# Patient Record
Sex: Female | Born: 1980 | Race: White | Hispanic: No | Marital: Single | Smoking: Current some day smoker
Health system: Southern US, Community
[De-identification: ages and names within clinical notes are randomized; demographics above are authoritative.]

## PROBLEM LIST (undated history)

## (undated) DIAGNOSIS — I071 Rheumatic tricuspid insufficiency: Secondary | ICD-10-CM

## (undated) DIAGNOSIS — N200 Calculus of kidney: Secondary | ICD-10-CM

## (undated) DIAGNOSIS — J45909 Unspecified asthma, uncomplicated: Secondary | ICD-10-CM

## (undated) DIAGNOSIS — I4891 Unspecified atrial fibrillation: Secondary | ICD-10-CM

## (undated) DIAGNOSIS — I340 Nonrheumatic mitral (valve) insufficiency: Secondary | ICD-10-CM

## (undated) DIAGNOSIS — R1030 Lower abdominal pain, unspecified: Secondary | ICD-10-CM

## (undated) DIAGNOSIS — R519 Headache, unspecified: Secondary | ICD-10-CM

## (undated) DIAGNOSIS — N83209 Unspecified ovarian cyst, unspecified side: Secondary | ICD-10-CM

## (undated) DIAGNOSIS — K8020 Calculus of gallbladder without cholecystitis without obstruction: Secondary | ICD-10-CM

## (undated) DIAGNOSIS — C801 Malignant (primary) neoplasm, unspecified: Secondary | ICD-10-CM

## (undated) DIAGNOSIS — N760 Acute vaginitis: Secondary | ICD-10-CM

## (undated) DIAGNOSIS — J189 Pneumonia, unspecified organism: Secondary | ICD-10-CM

## (undated) DIAGNOSIS — R51 Headache: Secondary | ICD-10-CM

## (undated) DIAGNOSIS — R000 Tachycardia, unspecified: Secondary | ICD-10-CM

## (undated) DIAGNOSIS — K5790 Diverticulosis of intestine, part unspecified, without perforation or abscess without bleeding: Secondary | ICD-10-CM

## (undated) HISTORY — DX: Calculus of kidney: N20.0

## (undated) HISTORY — DX: Rheumatic tricuspid insufficiency: I07.1

## (undated) HISTORY — DX: Nonrheumatic mitral (valve) insufficiency: I34.0

## (undated) HISTORY — DX: Calculus of gallbladder without cholecystitis without obstruction: K80.20

## (undated) HISTORY — DX: Pneumonia, unspecified organism: J18.9

## (undated) HISTORY — DX: Unspecified ovarian cyst, unspecified side: N83.209

## (undated) HISTORY — DX: Diverticulosis of intestine, part unspecified, without perforation or abscess without bleeding: K57.90

## (undated) HISTORY — PX: CHOLECYSTECTOMY: SHX55

## (undated) HISTORY — PX: GALLBLADDER SURGERY: SHX652

## (undated) HISTORY — PX: TONSILLECTOMY: SUR1361

---

## 2004-09-28 ENCOUNTER — Emergency Department (HOSPITAL_COMMUNITY): Admission: EM | Admit: 2004-09-28 | Discharge: 2004-09-28 | Payer: Self-pay | Admitting: Emergency Medicine

## 2006-03-21 ENCOUNTER — Emergency Department: Payer: Self-pay | Admitting: Emergency Medicine

## 2007-02-04 ENCOUNTER — Emergency Department: Payer: Self-pay | Admitting: Emergency Medicine

## 2009-01-09 ENCOUNTER — Ambulatory Visit: Payer: Self-pay | Admitting: Internal Medicine

## 2010-02-12 ENCOUNTER — Emergency Department: Payer: Self-pay | Admitting: Emergency Medicine

## 2010-08-05 ENCOUNTER — Emergency Department: Payer: Self-pay | Admitting: Emergency Medicine

## 2011-12-04 ENCOUNTER — Emergency Department: Payer: Self-pay | Admitting: Emergency Medicine

## 2012-02-24 ENCOUNTER — Ambulatory Visit: Payer: Self-pay | Admitting: Internal Medicine

## 2012-08-16 ENCOUNTER — Emergency Department: Payer: Self-pay | Admitting: Emergency Medicine

## 2012-11-06 ENCOUNTER — Ambulatory Visit: Payer: Self-pay | Admitting: Family Medicine

## 2012-11-06 LAB — RAPID INFLUENZA A&B ANTIGENS

## 2013-01-19 ENCOUNTER — Emergency Department: Payer: Self-pay | Admitting: Emergency Medicine

## 2013-01-19 LAB — CBC
HCT: 36.7 % (ref 35.0–47.0)
HGB: 12.7 g/dL (ref 12.0–16.0)
MCV: 92 fL (ref 80–100)
Platelet: 281 10*3/uL (ref 150–440)
WBC: 7.4 10*3/uL (ref 3.6–11.0)

## 2013-01-19 LAB — URINALYSIS, COMPLETE
Bacteria: NONE SEEN
Glucose,UR: NEGATIVE mg/dL (ref 0–75)
Ketone: NEGATIVE
Leukocyte Esterase: NEGATIVE
Nitrite: NEGATIVE
RBC,UR: 8 /HPF (ref 0–5)
Squamous Epithelial: 3
WBC UR: <1 /HPF (ref 0–5)

## 2013-01-19 LAB — COMPREHENSIVE METABOLIC PANEL
Albumin: 4 g/dL (ref 3.4–5.0)
Anion Gap: 7 (ref 7–16)
Bilirubin,Total: 0.4 mg/dL (ref 0.2–1.0)
Co2: 25 mmol/L (ref 21–32)
Creatinine: 0.54 mg/dL — ABNORMAL LOW (ref 0.60–1.30)
EGFR (Non-African Amer.): >60
Osmolality: 284 (ref 275–301)
Potassium: 4.1 mmol/L (ref 3.5–5.1)
SGOT(AST): 20 U/L (ref 15–37)
SGPT (ALT): 27 U/L (ref 12–78)
Total Protein: 7.1 g/dL (ref 6.4–8.2)

## 2013-04-04 ENCOUNTER — Emergency Department: Payer: Self-pay | Admitting: Emergency Medicine

## 2013-04-04 LAB — COMPREHENSIVE METABOLIC PANEL
Albumin: 3.8 g/dL (ref 3.4–5.0)
Alkaline Phosphatase: 45 U/L — ABNORMAL LOW (ref 50–136)
BUN: 13 mg/dL (ref 7–18)
Bilirubin,Total: 0.1 mg/dL — ABNORMAL LOW (ref 0.2–1.0)
EGFR (African American): >60
EGFR (Non-African Amer.): >60
Osmolality: 281 (ref 275–301)
Potassium: 3.8 mmol/L (ref 3.5–5.1)
Total Protein: 7 g/dL (ref 6.4–8.2)

## 2013-04-04 LAB — CBC
HCT: 37.4 % (ref 35.0–47.0)
MCH: 31.8 pg (ref 26.0–34.0)
MCV: 92 fL (ref 80–100)
Platelet: 288 10*3/uL (ref 150–440)
RBC: 4.06 10*6/uL (ref 3.80–5.20)
WBC: 10.3 10*3/uL (ref 3.6–11.0)

## 2013-05-12 ENCOUNTER — Emergency Department: Payer: Self-pay | Admitting: Emergency Medicine

## 2013-05-12 LAB — COMPREHENSIVE METABOLIC PANEL
Alkaline Phosphatase: 53 U/L (ref 50–136)
Anion Gap: 7 (ref 7–16)
Bilirubin,Total: 0.2 mg/dL (ref 0.2–1.0)
Calcium, Total: 9.1 mg/dL (ref 8.5–10.1)
Co2: 24 mmol/L (ref 21–32)
EGFR (African American): >60
EGFR (Non-African Amer.): >60
Glucose: 93 mg/dL (ref 65–99)
Osmolality: 278 (ref 275–301)
Potassium: 3.9 mmol/L (ref 3.5–5.1)
SGOT(AST): 24 U/L (ref 15–37)
SGPT (ALT): 32 U/L (ref 12–78)
Sodium: 140 mmol/L (ref 136–145)

## 2013-05-12 LAB — LIPASE, BLOOD: Lipase: 63 U/L — ABNORMAL LOW (ref 73–393)

## 2013-05-12 LAB — CBC
MCH: 32.1 pg (ref 26.0–34.0)
MCHC: 35.5 g/dL (ref 32.0–36.0)
MCV: 91 fL (ref 80–100)
RBC: 4.19 10*6/uL (ref 3.80–5.20)
WBC: 7 10*3/uL (ref 3.6–11.0)

## 2013-06-30 ENCOUNTER — Emergency Department: Payer: Self-pay | Admitting: Emergency Medicine

## 2013-11-04 ENCOUNTER — Emergency Department: Payer: Self-pay | Admitting: Emergency Medicine

## 2013-12-19 ENCOUNTER — Ambulatory Visit: Payer: Self-pay | Admitting: Internal Medicine

## 2013-12-21 ENCOUNTER — Ambulatory Visit: Payer: Self-pay | Admitting: Physician Assistant

## 2014-04-02 ENCOUNTER — Emergency Department: Payer: Self-pay | Admitting: Emergency Medicine

## 2014-04-02 LAB — CBC WITH DIFFERENTIAL/PLATELET
BASOS PCT: 0.5 %
Basophil #: 0 10*3/uL (ref 0.0–0.1)
Eosinophil #: 0.2 10*3/uL (ref 0.0–0.7)
Eosinophil %: 2.2 %
HCT: 36.4 % (ref 35.0–47.0)
HGB: 12.5 g/dL (ref 12.0–16.0)
Lymphocyte #: 2.7 10*3/uL (ref 1.0–3.6)
Lymphocyte %: 35.9 %
MCH: 32.2 pg (ref 26.0–34.0)
MCHC: 34.3 g/dL (ref 32.0–36.0)
MCV: 94 fL (ref 80–100)
MONO ABS: 0.4 x10 3/mm (ref 0.2–0.9)
Monocyte %: 4.9 %
NEUTROS ABS: 4.3 10*3/uL (ref 1.4–6.5)
Neutrophil %: 56.5 %
PLATELETS: 280 10*3/uL (ref 150–440)
RBC: 3.88 10*6/uL (ref 3.80–5.20)
RDW: 13.1 % (ref 11.5–14.5)
WBC: 7.6 10*3/uL (ref 3.6–11.0)

## 2014-04-02 LAB — COMPREHENSIVE METABOLIC PANEL
ALK PHOS: 46 U/L
ALT: 36 U/L (ref 12–78)
ANION GAP: 4 — AB (ref 7–16)
AST: 28 U/L (ref 15–37)
Albumin: 3.6 g/dL (ref 3.4–5.0)
BUN: 11 mg/dL (ref 7–18)
Bilirubin,Total: 0.1 mg/dL — ABNORMAL LOW (ref 0.2–1.0)
CALCIUM: 8.9 mg/dL (ref 8.5–10.1)
CO2: 27 mmol/L (ref 21–32)
Chloride: 110 mmol/L — ABNORMAL HIGH (ref 98–107)
Creatinine: 0.73 mg/dL (ref 0.60–1.30)
EGFR (African American): >60
EGFR (Non-African Amer.): >60
GLUCOSE: 81 mg/dL (ref 65–99)
Osmolality: 280 (ref 275–301)
POTASSIUM: 4 mmol/L (ref 3.5–5.1)
Sodium: 141 mmol/L (ref 136–145)
Total Protein: 7 g/dL (ref 6.4–8.2)

## 2014-04-02 LAB — LIPASE, BLOOD: LIPASE: 162 U/L (ref 73–393)

## 2014-04-04 ENCOUNTER — Emergency Department (HOSPITAL_COMMUNITY): Payer: Medicaid Other

## 2014-04-04 ENCOUNTER — Emergency Department (HOSPITAL_COMMUNITY)
Admission: EM | Admit: 2014-04-04 | Discharge: 2014-04-04 | Disposition: A | Discharge status: Home / Self Care | Payer: Medicaid Other | Attending: Emergency Medicine | Admitting: Emergency Medicine

## 2014-04-04 ENCOUNTER — Encounter (HOSPITAL_COMMUNITY): Payer: Self-pay | Admitting: Emergency Medicine

## 2014-04-04 DIAGNOSIS — R1011 Right upper quadrant pain: Secondary | ICD-10-CM | POA: Insufficient documentation

## 2014-04-04 DIAGNOSIS — F17200 Nicotine dependence, unspecified, uncomplicated: Secondary | ICD-10-CM | POA: Insufficient documentation

## 2014-04-04 DIAGNOSIS — R1012 Left upper quadrant pain: Secondary | ICD-10-CM | POA: Insufficient documentation

## 2014-04-04 DIAGNOSIS — Z79899 Other long term (current) drug therapy: Secondary | ICD-10-CM | POA: Insufficient documentation

## 2014-04-04 DIAGNOSIS — R1013 Epigastric pain: Secondary | ICD-10-CM | POA: Insufficient documentation

## 2014-04-04 DIAGNOSIS — R112 Nausea with vomiting, unspecified: Secondary | ICD-10-CM | POA: Insufficient documentation

## 2014-04-04 DIAGNOSIS — R109 Unspecified abdominal pain: Secondary | ICD-10-CM

## 2014-04-04 DIAGNOSIS — I4891 Unspecified atrial fibrillation: Secondary | ICD-10-CM | POA: Insufficient documentation

## 2014-04-04 DIAGNOSIS — Z9089 Acquired absence of other organs: Secondary | ICD-10-CM | POA: Insufficient documentation

## 2014-04-04 HISTORY — DX: Unspecified atrial fibrillation: I48.91

## 2014-04-04 LAB — COMPREHENSIVE METABOLIC PANEL
ALT: 25 U/L (ref 0–35)
AST: 18 U/L (ref 0–37)
Albumin: 3.9 g/dL (ref 3.5–5.2)
Alkaline Phosphatase: 48 U/L (ref 39–117)
BUN: 9 mg/dL (ref 6–23)
CALCIUM: 9.2 mg/dL (ref 8.4–10.5)
CHLORIDE: 107 meq/L (ref 96–112)
CO2: 23 mEq/L (ref 19–32)
Creatinine, Ser: 0.74 mg/dL (ref 0.50–1.10)
GFR calc non Af Amer: >90 mL/min (ref >90)
GLUCOSE: 99 mg/dL (ref 70–99)
Potassium: 4 mEq/L (ref 3.7–5.3)
Sodium: 144 mEq/L (ref 137–147)
Total Protein: 6.8 g/dL (ref 6.0–8.3)

## 2014-04-04 LAB — URINALYSIS, ROUTINE W REFLEX MICROSCOPIC
Bilirubin Urine: NEGATIVE
Glucose, UA: NEGATIVE mg/dL
KETONES UR: NEGATIVE mg/dL
Leukocytes, UA: NEGATIVE
NITRITE: NEGATIVE
Protein, ur: NEGATIVE mg/dL
SPECIFIC GRAVITY, URINE: 1.028 (ref 1.005–1.030)
UROBILINOGEN UA: 0.2 mg/dL (ref 0.0–1.0)
pH: 6 (ref 5.0–8.0)

## 2014-04-04 LAB — CBC WITH DIFFERENTIAL/PLATELET
Basophils Absolute: 0 10*3/uL (ref 0.0–0.1)
Basophils Relative: 0 % (ref 0–1)
EOS PCT: 3 % (ref 0–5)
Eosinophils Absolute: 0.2 10*3/uL (ref 0.0–0.7)
HEMATOCRIT: 35.6 % — AB (ref 36.0–46.0)
Hemoglobin: 12.1 g/dL (ref 12.0–15.0)
LYMPHS ABS: 2.6 10*3/uL (ref 0.7–4.0)
LYMPHS PCT: 42 % (ref 12–46)
MCH: 31.5 pg (ref 26.0–34.0)
MCHC: 34 g/dL (ref 30.0–36.0)
MCV: 92.7 fL (ref 78.0–100.0)
MONO ABS: 0.3 10*3/uL (ref 0.1–1.0)
Monocytes Relative: 4 % (ref 3–12)
NEUTROS ABS: 3.1 10*3/uL (ref 1.7–7.7)
Neutrophils Relative %: 51 % (ref 43–77)
Platelets: 306 10*3/uL (ref 150–400)
RBC: 3.84 MIL/uL — ABNORMAL LOW (ref 3.87–5.11)
RDW: 13 % (ref 11.5–15.5)
WBC: 6.1 10*3/uL (ref 4.0–10.5)

## 2014-04-04 LAB — URINE MICROSCOPIC-ADD ON

## 2014-04-04 LAB — LIPASE, BLOOD: Lipase: 38 U/L (ref 11–59)

## 2014-04-04 MED ORDER — ONDANSETRON HCL 4 MG/2ML IJ SOLN
4.0000 mg | Freq: Once | INTRAMUSCULAR | Status: AC
  Administered 2014-04-04: 4 mg via INTRAVENOUS
  Filled 2014-04-04: qty 2

## 2014-04-04 MED ORDER — SODIUM CHLORIDE 0.9 % IV SOLN
Freq: Once | INTRAVENOUS | Status: AC
  Administered 2014-04-04: 16:00:00 via INTRAVENOUS

## 2014-04-04 MED ORDER — MORPHINE SULFATE 4 MG/ML IJ SOLN
4.0000 mg | Freq: Once | INTRAMUSCULAR | Status: AC
  Administered 2014-04-04: 4 mg via INTRAVENOUS
  Filled 2014-04-04: qty 1

## 2014-04-04 MED ORDER — PROMETHAZINE HCL 25 MG PO TABS: 25.0000 mg | ORAL_TABLET | Freq: Four times a day (QID) | ORAL | Status: DC | PRN

## 2014-04-04 MED ORDER — SUCRALFATE 1 GM/10ML PO SUSP: 1.0000 g | Freq: Three times a day (TID) | ORAL | Status: DC

## 2014-04-04 MED ORDER — IOHEXOL 300 MG/ML  SOLN
100.0000 mL | Freq: Once | INTRAMUSCULAR | Status: AC | PRN
  Administered 2014-04-04: 100 mL via INTRAVENOUS

## 2014-04-04 MED ORDER — SODIUM CHLORIDE 0.9 % IV SOLN
Freq: Once | INTRAVENOUS | Status: AC
  Administered 2014-04-04: 13:00:00 via INTRAVENOUS

## 2014-04-04 MED ORDER — HYDROCODONE-ACETAMINOPHEN 5-325 MG PO TABS: 2.0000 | ORAL_TABLET | ORAL | Status: DC | PRN

## 2014-04-04 MED ORDER — RANITIDINE HCL 150 MG PO TABS: 150.0000 mg | ORAL_TABLET | Freq: Two times a day (BID) | ORAL | Status: DC

## 2014-04-04 MED ORDER — DICYCLOMINE HCL 20 MG PO TABS: 20.0000 mg | ORAL_TABLET | Freq: Two times a day (BID) | ORAL | Status: DC

## 2014-04-04 NOTE — ED Notes (Signed)
MD at bedside. Polina

## 2014-04-04 NOTE — ED Notes (Signed)
Called CT to let them know PT finished contrast

## 2014-04-04 NOTE — Discharge Instructions (Signed)
Abdominal Pain, Adult °Many things can cause abdominal pain. Usually, abdominal pain is not caused by a disease and will improve without treatment. It can often be observed and treated at home. Your health care provider will do a physical exam and possibly order blood tests and X-rays to help determine the seriousness of your pain. However, in many cases, more time must pass before a clear cause of the pain can be found. Before that point, your health care provider may not know if you need more testing or further treatment. °HOME CARE INSTRUCTIONS  °Monitor your abdominal pain for any changes. The following actions may help to alleviate any discomfort you are experiencing: °· Only take over-the-counter or prescription medicines as directed by your health care provider. °· Do not take laxatives unless directed to do so by your health care provider. °· Try a clear liquid diet (broth, tea, or water) as directed by your health care provider. Slowly move to a bland diet as tolerated. °SEEK MEDICAL CARE IF: °· You have unexplained abdominal pain. °· You have abdominal pain associated with nausea or diarrhea. °· You have pain when you urinate or have a bowel movement. °· You experience abdominal pain that wakes you in the night. °· You have abdominal pain that is worsened or improved by eating food. °· You have abdominal pain that is worsened with eating fatty foods. °SEEK IMMEDIATE MEDICAL CARE IF:  °· Your pain does not go away within 2 hours. °· You have a fever. °· You keep throwing up (vomiting). °· Your pain is felt only in portions of the abdomen, such as the right side or the left lower portion of the abdomen. °· You pass bloody or black tarry stools. °MAKE SURE YOU: °· Understand these instructions.   °· Will watch your condition.   °· Will get help right away if you are not doing well or get worse.   °Document Released: 08/15/2005 Document Revised: 08/26/2013 Document Reviewed: 07/15/2013 °[employer]® Patient  Information ©2014 [employer]. ° °

## 2014-04-04 NOTE — ED Notes (Signed)
Patient transported to CT 

## 2014-04-04 NOTE — ED Notes (Signed)
PT has been having abd pain for ~2weeks. Saw ob/gyn for suspect cervical cancer, neg results. Pain recently moved to RUQ radiating to her back and umbillicus. Friday, her ob/gyn thought she may have pancreatitis and suggested she go to ED. Went to [facility] and waited 4hrs in a room then left. 10/10 RUQ pain. N/v since Thursday, denies diarrhea, fevers

## 2014-04-04 NOTE — ED Notes (Signed)
Pt reports that her OB/GYN Dr told her to go to ER on Friday because she has pancreatitis. Pt went to [place] and waited for four hours, but had to leave. Pt c/o pain to right upper abdomen x 2 weeks area with N/V.

## 2014-04-04 NOTE — ED Notes (Signed)
MD at bedside.Pollina

## 2014-04-04 NOTE — ED Provider Notes (Signed)
CSN: [id number]     Arrival date & time 04/04/14  1205 History   First MD Initiated Contact with Patient 04/04/14 1219     Chief Complaint  Patient presents with  . Abdominal Pain     (Consider location/radiation/quality/duration/timing/severity/associated sxs/prior Treatment) HPI Comments: Patient presents to the ER for evaluation of abdominal pain. Patient has been experiencing right upper and right to left or from pain for 2 weeks. She has had associated nausea and vomiting, inability to drink because of these symptoms. She does have a history of cholecystectomy. There is no chest pain or difficulty breathing.  She saw her OB/GYN doctor and had blood work. He told her that she had pancreatitis. Patient followup with her primary care doctor to review the labs and told her that her pancreas labs were normal. Patient is expressing concern over continued symptoms and not having a reason for her pain.  Patient is a 33 y.o. female presenting with abdominal pain.  Abdominal Pain Associated symptoms: nausea and vomiting     Past Medical History  Diagnosis Date  . A-fib    Past Surgical History  Procedure Laterality Date  . Cholecystectomy    . Cesarean section    . Tonsillectomy     No family history on file. History  Substance Use Topics  . Smoking status: Current Every Day Smoker  . Smokeless tobacco: Not on file  . Alcohol Use: No   OB History   Grav Para Term Preterm Abortions TAB SAB Ect Mult Living                 Review of Systems  Gastrointestinal: Positive for nausea, vomiting and abdominal pain.  All other systems reviewed and are negative.     Allergies  Lyrica  Home Medications   Prior to Admission medications   Medication Sig Start Date End Date Taking? Authorizing Provider  metoprolol (LOPRESSOR) 50 MG tablet Take 50 mg by mouth 2 (two) times daily.   Yes Historical Provider, MD   BP 91/52  Pulse 69  Temp(Src) 98.5 F (36.9 C) (Oral)  Resp 14  Wt  210 lb (95.255 kg)  SpO2 99% Physical Exam  Constitutional: She is oriented to person, place, and time. She appears well-developed and well-nourished. No distress.  HENT:  Head: Normocephalic and atraumatic.  Right Ear: Hearing normal.  Left Ear: Hearing normal.  Nose: Nose normal.  Mouth/Throat: Oropharynx is clear and moist and mucous membranes are normal.  Eyes: Conjunctivae and EOM are normal. Pupils are equal, round, and reactive to light.  Neck: Normal range of motion. Neck supple.  Cardiovascular: Regular rhythm, S1 normal and S2 normal.  Exam reveals no gallop and no friction rub.   No murmur heard. Pulmonary/Chest: Effort normal and breath sounds normal. No respiratory distress. She exhibits no tenderness.  Abdominal: Soft. Normal appearance and bowel sounds are normal. There is no hepatosplenomegaly. There is tenderness in the right upper quadrant, epigastric area and left upper quadrant. There is no rebound, no guarding, no tenderness at McBurney's point and negative Murphy's sign. No hernia.  Musculoskeletal: Normal range of motion.  Neurological: She is alert and oriented to person, place, and time. She has normal strength. No cranial nerve deficit or sensory deficit. Coordination normal. GCS eye subscore is 4. GCS verbal subscore is 5. GCS motor subscore is 6.  Skin: Skin is warm, dry and intact. No rash noted. No cyanosis.  Psychiatric: She has a normal mood and affect. Her speech is  normal and behavior is normal. Thought content normal.    ED Course  Procedures (including critical care time) Labs Review Labs Reviewed  CBC WITH DIFFERENTIAL - Abnormal; Notable for the following:    RBC 3.84 (*)    HCT 35.6 (*)    All other components within normal limits  COMPREHENSIVE METABOLIC PANEL - Abnormal; Notable for the following:    Total Bilirubin <0.2 (*)    All other components within normal limits  URINALYSIS, ROUTINE W REFLEX MICROSCOPIC - Abnormal; Notable for the  following:    Color, Urine AMBER (*)    Hgb urine dipstick SMALL (*)    All other components within normal limits  URINE MICROSCOPIC-ADD ON - Abnormal; Notable for the following:    Squamous Epithelial / LPF FEW (*)    All other components within normal limits  LIPASE, BLOOD    Imaging Review Ct Abdomen Pelvis W Contrast  04/04/2014   CLINICAL DATA:  Abdomen pain for 2 weeks.  EXAM: CT ABDOMEN AND PELVIS WITH CONTRAST  TECHNIQUE: Multidetector CT imaging of the abdomen and pelvis was performed using the standard protocol following bolus administration of intravenous contrast.  CONTRAST:  153mL OMNIPAQUE IOHEXOL 300 MG/ML  SOLN  COMPARISON:  None.  FINDINGS: The liver, spleen, pancreas, adrenal glands and kidneys are normal. The patient is status post prior cholecystectomy. The aorta is normal. There is no abdominal lymphadenopathy. There is no small bowel obstruction or diverticulitis. The appendix is normal.  Fluid-filled bladder is normal. The uterus is normal. Pelvic phleboliths are noted. Minimal dependent atelectasis of bilateral lung bases are noted. There are extensive degenerative joint changes of L5-S1. No acute abnormalities identified within the visualized bones.  IMPRESSION: No acute abnormality identified in the abdomen and pelvis.   Electronically Signed   By: Abelardo Diesel M.D.   On: 04/04/2014 15:43     EKG Interpretation None      MDM   Final diagnoses:  None   The patient presents with recurrent upper abdominal pain for 2 weeks. She has had previous cholecystectomy. Patient's examination reveals diffuse tenderness in the upper abdomen without guarding or rebound. Lab work was unremarkable. Liver function tests normal, renal function normal, white count 6.1. Urinalysis unremarkable.  CT scan is performed to further evaluate. No obvious abnormalities are seen.  Patient symptoms are possibly secondary to gastritis/peptic ulcer disease/heard. Irritable bowel can be  considered. Patient might benefit from endoscopy, will refer to GI. Patient to be treated with Phenergan, Vicodin for pain and nausea. She will be prescribed Carafate, Zantac for reflux and Bentyl to be used as needed for cramping.    Orpah Greek, MD 04/04/14 [phone]

## 2014-04-07 ENCOUNTER — Encounter: Payer: Self-pay | Admitting: Gastroenterology

## 2014-04-20 ENCOUNTER — Ambulatory Visit (INDEPENDENT_AMBULATORY_CARE_PROVIDER_SITE_OTHER): Payer: Medicaid Other | Admitting: Gastroenterology

## 2014-04-20 ENCOUNTER — Encounter: Payer: Self-pay | Admitting: Gastroenterology

## 2014-04-20 VITALS — BP 128/80 | HR 78 | Ht 65.0 in | Wt 216.6 lb

## 2014-04-20 DIAGNOSIS — R109 Unspecified abdominal pain: Secondary | ICD-10-CM

## 2014-04-20 NOTE — Progress Notes (Signed)
HPI: This is a very pleasant 33 year old woman whom I am meeting for the first time today.     CT scan 03/2014 abd/pelvis with IV and oral contrast was normal (previous cholecystectomy). Labs 03/2014: cbc, cmet, UA, lipase were all essentially normal.  She had her gallbladder removed for stone disease at [facility] October 2014. We do not have those records here.  Labs from PCP? H pylori Ab was "equivocal" and she was recommended to started amox, clarith, protonix therapy.  Tells me FOBT was negative.  She completed the antibiotics.  Was in ER last month.  For about 2 months; epigastric pain. The pain is constant.  Can wax and wane.  Eating does not make a difference.  Nothing has helped the pain.  No positions help.  Throbbing pain.   Can be sharp as well (occurs 7-8 times per day).=  Had recent abnormal PAP  No NSAIDs.  Nausea + with the pains. Taking a lot of pepto, several per day.  Has lost 12 pounds in past 2 months.  Overall past year pretty stable.    Review of systems: Pertinent positive and negative review of systems were noted in the above HPI section. Complete review of systems was performed and was otherwise normal.    Past Medical History  Diagnosis Date  . A-fib   . Gallstones   . Kidney stones   . Pneumonia   . Diverticulosis     Past Surgical History  Procedure Laterality Date  . Cholecystectomy    . Cesarean section    . Tonsillectomy      Current Outpatient Prescriptions  Medication Sig Dispense Refill  . dicyclomine (BENTYL) 20 MG tablet Take 1 tablet (20 mg total) by mouth 2 (two) times daily.  20 tablet  0  . metoprolol (LOPRESSOR) 50 MG tablet Take 50 mg by mouth 2 (two) times daily.      . promethazine (PHENERGAN) 25 MG tablet Take 1 tablet (25 mg total) by mouth every 6 (six) hours as needed for nausea or vomiting.  30 tablet  0  . ranitidine (ZANTAC) 150 MG tablet Take 1 tablet (150 mg total) by mouth 2 (two) times daily.  60 tablet  0  .  sucralfate (CARAFATE) 1 GM/10ML suspension Take 10 mLs (1 g total) by mouth 4 (four) times daily -  with meals and at bedtime.  420 mL  0   No current facility-administered medications for this visit.    Allergies as of 04/20/2014 - Review Complete 04/20/2014  Allergen Reaction Noted  . Lyrica [pregabalin] Swelling 04/04/2014    Family History  Problem Relation Age of Onset  . Colon cancer Paternal Grandmother   . Ulcerative colitis    . Diabetes    . Kidney disease      History   Social History  . Marital Status: Single    Spouse Name: N/A    Number of Children: N/A  . Years of Education: N/A   Occupational History  . therapist    Social History Main Topics  . Smoking status: Current Some Day Smoker  . Smokeless tobacco: Not on file  . Alcohol Use: No  . Drug Use: No  . Sexual Activity: Not on file   Other Topics Concern  . Not on file   Social History Narrative  . No narrative on file       Physical Exam: BP 128/80  Pulse 78  Ht 5\' 5"  (1.651 m)  Wt 216 lb 9.6  oz (98.249 kg)  BMI 36.04 kg/m2 Constitutional: generally well-appearing Psychiatric: alert and oriented x3 Eyes: extraocular movements intact Mouth: oral pharynx moist, no lesions Neck: supple no lymphadenopathy Cardiovascular: heart regular rate and rhythm Lungs: clear to auscultation bilaterally Abdomen: soft, she is tender to fairly light palpation in the left upper quadrant, nondistended, no obvious ascites, no peritoneal signs, normal bowel sounds Extremities: no lower extremity edema bilaterally Skin: no lesions on visible extremities    Assessment and plan: 33 y.o. female with  epigastric, left upper quadrant pain, tenderness  CAT scan last month and blood tests were all normal. H. pylori by serology was positive and perhaps this is H. pylori infection related. She completed antibiotics 2-3 days ago and I do suspect it take some time for the symptoms to resolve if they were truly  caused by bacterial. She is more tender on examination even on light palpation than is usual for gastric related discomforts. Perhaps this is a pinched nerve area I would like to proceed with EGD to check for significant gastritis, peptic ulcer disease, H. pylori infection. If that is not helpful then I would probably repeat lab tests and perhaps imaging studies as well.

## 2014-04-20 NOTE — Patient Instructions (Addendum)
One of your biggest health concerns is your smoking.  This increases your risk for most cancers and serious cardiovascular diseases such as strokes, heart attacks.  You should try your best to stop.  If you need assistance, please contact your PCP or Smoking Cessation Class at [facility] [phone]) or [place] (1-800-QUIT-NOW). You will be set up for an upper endoscopy for your abdominal pain.

## 2014-04-21 ENCOUNTER — Ambulatory Visit (AMBULATORY_SURGERY_CENTER): Payer: Medicaid Other | Admitting: Gastroenterology

## 2014-04-21 ENCOUNTER — Encounter: Payer: Self-pay | Admitting: Gastroenterology

## 2014-04-21 VITALS — BP 110/67 | HR 66 | Temp 98.2°F | Resp 18 | Ht 65.0 in | Wt 216.0 lb

## 2014-04-21 DIAGNOSIS — R109 Unspecified abdominal pain: Secondary | ICD-10-CM

## 2014-04-21 DIAGNOSIS — K2970 Gastritis, unspecified, without bleeding: Secondary | ICD-10-CM

## 2014-04-21 DIAGNOSIS — K2940 Chronic atrophic gastritis without bleeding: Secondary | ICD-10-CM

## 2014-04-21 DIAGNOSIS — K2990 Gastroduodenitis, unspecified, without bleeding: Secondary | ICD-10-CM

## 2014-04-21 MED ORDER — PANTOPRAZOLE SODIUM 40 MG PO TBEC: 40.0000 mg | DELAYED_RELEASE_TABLET | Freq: Two times a day (BID) | ORAL | Status: DC

## 2014-04-21 MED ORDER — SODIUM CHLORIDE 0.9 % IV SOLN: 500.0000 mL | INTRAVENOUS | Status: DC

## 2014-04-21 NOTE — Patient Instructions (Signed)
Discharge instructions given with verbal understanding. Biopsies taken. Resume previous medications. YOU HAD AN ENDOSCOPIC PROCEDURE TODAY AT THE [facility]: Refer to the procedure report that was given to you for any specific questions about what was found during the examination.  If the procedure report does not answer your questions, please call your gastroenterologist to clarify.  If you requested that your care partner not be given the details of your procedure findings, then the procedure report has been included in a sealed envelope for you to review at your convenience later.  YOU SHOULD EXPECT: Some feelings of bloating in the abdomen. Passage of more gas than usual.  Walking can help get rid of the air that was put into your GI tract during the procedure and reduce the bloating. If you had a lower endoscopy (such as a colonoscopy or flexible sigmoidoscopy) you may notice spotting of blood in your stool or on the toilet paper. If you underwent a bowel prep for your procedure, then you may not have a normal bowel movement for a few days.  DIET: Your first meal following the procedure should be a light meal and then it is ok to progress to your normal diet.  A half-sandwich or bowl of soup is an example of a good first meal.  Heavy or fried foods are harder to digest and may make you feel nauseous or bloated.  Likewise meals heavy in dairy and vegetables can cause extra gas to form and this can also increase the bloating.  Drink plenty of fluids but you should avoid alcoholic beverages for 24 hours.  ACTIVITY: Your care partner should take you home directly after the procedure.  You should plan to take it easy, moving slowly for the rest of the day.  You can resume normal activity the day after the procedure however you should NOT DRIVE or use heavy machinery for 24 hours (because of the sedation medicines used during the test).    SYMPTOMS TO REPORT IMMEDIATELY: A gastroenterologist  can be reached at any hour.  During normal business hours, 8:30 AM to 5:00 PM Monday through Friday, call [phone].  After hours and on weekends, please call the GI answering service at [phone] who will take a message and have the physician on call contact you.   Following upper endoscopy (EGD)  Vomiting of blood or coffee ground material  New chest pain or pain under the shoulder blades  Painful or persistently difficult swallowing  New shortness of breath  Fever of 100F or higher  Black, tarry-looking stools  FOLLOW UP: If any biopsies were taken you will be contacted by phone or by letter within the next 1-3 weeks.  Call your gastroenterologist if you have not heard about the biopsies in 3 weeks.  Our staff will call the home number listed on your records the next business day following your procedure to check on you and address any questions or concerns that you may have at that time regarding the information given to you following your procedure. This is a courtesy call and so if there is no answer at the home number and we have not heard from you through the emergency physician on call, we will assume that you have returned to your regular daily activities without incident.  SIGNATURES/CONFIDENTIALITY: You and/or your care partner have signed paperwork which will be entered into your electronic medical record.  These signatures attest to the fact that that the information above on your After   Visit Summary has been reviewed and is understood.  Full responsibility of the confidentiality of this discharge information lies with you and/or your care-partner. 

## 2014-04-21 NOTE — Op Note (Signed)
[place]  [address]. [place], [address]   ENDOSCOPY PROCEDURE REPORT  PATIENT: Katherine Murray, Katherine Murray  MR#: [id number] BIRTHDATE: 11/16/1981 , 32  yrs. old GENDER: Female ENDOSCOPIST: Milus Banister, MD REFERRED BY:  Hortencia Pilar, MD PROCEDURE DATE:  04/21/2014 PROCEDURE:  EGD w/ biopsy ASA CLASS:     Class II INDICATIONS:  upper abdominal pain, worse after eating, weight loss; cmet, cbc, CT scan last month were all normal. MEDICATIONS: MAC sedation, administered by CRNA and propofol (Diprivan) 150mg  IV TOPICAL ANESTHETIC: none  DESCRIPTION OF PROCEDURE: After the risks benefits and alternatives of the procedure were thoroughly explained, informed consent was obtained.  The LB [id number] endoscope was introduced through the mouth and advanced to the second portion of the duodenum. Without limitations.  The instrument was slowly withdrawn as the mucosa was fully examined.     There was moderate, non-specific pangastritis.  The distal stomach was biopsied and sent to pathology.  The examination was otherwise normal.  Retroflexed views revealed no abnormalities.     The scope was then withdrawn from the patient and the procedure completed.  COMPLICATIONS: There were no complications. ENDOSCOPIC IMPRESSION: There was moderate, non-specific pangastritis.  The distal stomach was biopsied and sent to pathology.  The examination was otherwise normal.  RECOMMENDATIONS: Await final biopsies: If + for H.  pylori, you will be started on appropriate antibiotics.  If negative for H.  pylori, will likely proceed with further imaging, blood tests.  For now, please increase your protonix to twice daily (new script sent in today).   eSigned:  Milus Banister, MD 04/21/2014 9:16 AM

## 2014-04-21 NOTE — Progress Notes (Signed)
Called to room to assist during endoscopic procedure.  Patient ID and intended procedure confirmed with present staff. Received instructions for my participation in the procedure from the performing physician.  

## 2014-04-21 NOTE — Progress Notes (Signed)
Report to PACU, RN, vss, BBS= Clear.  

## 2014-04-22 ENCOUNTER — Telehealth: Payer: Self-pay

## 2014-04-22 NOTE — Telephone Encounter (Signed)
Left message on answering machine. 

## 2014-04-28 ENCOUNTER — Other Ambulatory Visit: Payer: Self-pay

## 2014-04-28 DIAGNOSIS — R109 Unspecified abdominal pain: Secondary | ICD-10-CM

## 2014-05-07 ENCOUNTER — Telehealth: Payer: Self-pay

## 2014-05-07 NOTE — Telephone Encounter (Signed)
Pt is aware to have labs

## 2014-05-07 NOTE — Telephone Encounter (Signed)
Message copied by Barron Alvine on Fri May 07, 2014  9:19 AM ------      Message from: Barron Alvine      Created: Wed Apr 28, 2014  8:45 AM       Pt to get labs  ------

## 2014-07-02 ENCOUNTER — Other Ambulatory Visit (INDEPENDENT_AMBULATORY_CARE_PROVIDER_SITE_OTHER): Payer: Medicaid Other

## 2014-07-02 DIAGNOSIS — R109 Unspecified abdominal pain: Secondary | ICD-10-CM

## 2014-07-02 LAB — CBC WITH DIFFERENTIAL/PLATELET
BASOS ABS: 0.1 10*3/uL (ref 0.0–0.1)
BASOS PCT: 0.9 % (ref 0.0–3.0)
Eosinophils Absolute: 0.2 10*3/uL (ref 0.0–0.7)
Eosinophils Relative: 2.6 % (ref 0.0–5.0)
HEMATOCRIT: 37.8 % (ref 36.0–46.0)
HEMOGLOBIN: 12.9 g/dL (ref 12.0–15.0)
LYMPHS ABS: 2.9 10*3/uL (ref 0.7–4.0)
LYMPHS PCT: 35.7 % (ref 12.0–46.0)
MCHC: 34.3 g/dL (ref 30.0–36.0)
MCV: 95 fl (ref 78.0–100.0)
MONOS PCT: 4.4 % (ref 3.0–12.0)
Monocytes Absolute: 0.4 10*3/uL (ref 0.1–1.0)
NEUTROS ABS: 4.5 10*3/uL (ref 1.4–7.7)
Neutrophils Relative %: 56.4 % (ref 43.0–77.0)
Platelets: 273 10*3/uL (ref 150.0–400.0)
RBC: 3.98 Mil/uL (ref 3.87–5.11)
RDW: 12.9 % (ref 11.5–15.5)
WBC: 8 10*3/uL (ref 4.0–10.5)

## 2014-07-02 LAB — COMPREHENSIVE METABOLIC PANEL
ALT: 24 U/L (ref 0–35)
AST: 19 U/L (ref 0–37)
Albumin: 4.1 g/dL (ref 3.5–5.2)
Alkaline Phosphatase: 36 U/L — ABNORMAL LOW (ref 39–117)
BUN: 10 mg/dL (ref 6–23)
CALCIUM: 9.8 mg/dL (ref 8.4–10.5)
CHLORIDE: 106 meq/L (ref 96–112)
CO2: 26 meq/L (ref 19–32)
CREATININE: 0.7 mg/dL (ref 0.4–1.2)
GFR: 103.69 mL/min (ref >60.00)
GLUCOSE: 95 mg/dL (ref 70–99)
Potassium: 4 mEq/L (ref 3.5–5.1)
Sodium: 139 mEq/L (ref 135–145)
Total Bilirubin: 0.5 mg/dL (ref 0.2–1.2)
Total Protein: 6.7 g/dL (ref 6.0–8.3)

## 2014-07-02 LAB — SEDIMENTATION RATE: SED RATE: 11 mm/h (ref 0–22)

## 2014-07-18 ENCOUNTER — Emergency Department: Payer: Self-pay | Admitting: Emergency Medicine

## 2014-07-18 LAB — BASIC METABOLIC PANEL
ANION GAP: 9 (ref 7–16)
BUN: 7 mg/dL (ref 7–18)
Calcium, Total: 9.5 mg/dL (ref 8.5–10.1)
Chloride: 108 mmol/L — ABNORMAL HIGH (ref 98–107)
Co2: 22 mmol/L (ref 21–32)
Creatinine: 0.7 mg/dL (ref 0.60–1.30)
EGFR (African American): >60
EGFR (Non-African Amer.): >60
Glucose: 110 mg/dL — ABNORMAL HIGH (ref 65–99)
Osmolality: 276 (ref 275–301)
Potassium: 3.9 mmol/L (ref 3.5–5.1)
Sodium: 139 mmol/L (ref 136–145)

## 2014-07-18 LAB — CBC
HCT: 39.9 % (ref 35.0–47.0)
HGB: 13.1 g/dL (ref 12.0–16.0)
MCH: 31.4 pg (ref 26.0–34.0)
MCHC: 32.8 g/dL (ref 32.0–36.0)
MCV: 96 fL (ref 80–100)
PLATELETS: 283 10*3/uL (ref 150–440)
RBC: 4.17 10*6/uL (ref 3.80–5.20)
RDW: 13.3 % (ref 11.5–14.5)
WBC: 7.2 10*3/uL (ref 3.6–11.0)

## 2014-07-18 LAB — TROPONIN I

## 2014-07-18 LAB — D-DIMER(ARMC): D-Dimer: 300 ng/ml

## 2014-08-27 LAB — BASIC METABOLIC PANEL
ANION GAP: 8 (ref 7–16)
BUN: 12 mg/dL (ref 7–18)
CALCIUM: 8.4 mg/dL — AB (ref 8.5–10.1)
Chloride: 111 mmol/L — ABNORMAL HIGH (ref 98–107)
Co2: 25 mmol/L (ref 21–32)
Creatinine: 0.75 mg/dL (ref 0.60–1.30)
EGFR (Non-African Amer.): >60
GLUCOSE: 96 mg/dL (ref 65–99)
Osmolality: 286 (ref 275–301)
POTASSIUM: 3.7 mmol/L (ref 3.5–5.1)
Sodium: 144 mmol/L (ref 136–145)

## 2014-08-27 LAB — CBC
HCT: 35.7 % (ref 35.0–47.0)
HGB: 11.7 g/dL — ABNORMAL LOW (ref 12.0–16.0)
MCH: 31 pg (ref 26.0–34.0)
MCHC: 32.6 g/dL (ref 32.0–36.0)
MCV: 95 fL (ref 80–100)
Platelet: 237 10*3/uL (ref 150–440)
RBC: 3.76 10*6/uL — AB (ref 3.80–5.20)
RDW: 13 % (ref 11.5–14.5)
WBC: 7.4 10*3/uL (ref 3.6–11.0)

## 2014-08-27 LAB — D-DIMER(ARMC): D-Dimer: 251 ng/ml

## 2014-08-27 LAB — PRO B NATRIURETIC PEPTIDE: B-Type Natriuretic Peptide: 186 pg/mL — ABNORMAL HIGH (ref 0–125)

## 2014-08-27 LAB — TROPONIN I: Troponin-I: <0.02 ng/mL

## 2014-08-28 ENCOUNTER — Observation Stay: Payer: Self-pay | Admitting: Internal Medicine

## 2014-08-28 LAB — LIPID PANEL
Cholesterol: 149 mg/dL (ref 0–200)
HDL: 22 mg/dL — AB (ref 40–60)
LDL CHOLESTEROL, CALC: 71 mg/dL (ref 0–100)
TRIGLYCERIDES: 278 mg/dL — AB (ref 0–200)
VLDL CHOLESTEROL, CALC: 56 mg/dL — AB (ref 5–40)

## 2014-08-28 LAB — PREGNANCY, URINE: Pregnancy Test, Urine: NEGATIVE m[IU]/mL

## 2015-03-12 NOTE — Consult Note (Signed)
Referring Physician:  Lytle Butte   Primary Care Physician:  [facility], [address] [place],  [address], [place] [phone]  Reason for Consult: Admit Date: 27-Aug-2014  Chief Complaint: R vision loss  Reason for Consult: headache   History of Present Illness: History of Present Illness:   34 yo RHD F presents with R sided headache for 3 days that is 10/10 with throbbing, photophobia and N/V.  On day 3 of headache, pt noted numbness and tingling around R eye then complete loss of vision on R eye.  She denies seeing sparkling and colors to me.  She still can not see out of that eye.  She has hx of migraines but none lasting this long and none with vision changes.  These migraines are rare as well occuring only 3-4 x/year.  ROS:  General denies complaints   HEENT no complaints  R vision loss   Lungs no complaints   Cardiac no complaints   GI no complaints   GU no complaints   Musculoskeletal no complaints   Extremities no complaints   Skin no complaints   Neuro headache   Endocrine no complaints   Psych no complaints   Past Medical/Surgical Hx:  recurrent pneumonia:   Hypertension:   spondylosis:   Migraines:   Tachycardia:   L-5 compressed:   spindalosis:   Diverticulitis:   Cholecystectomy:   Past Medical/ Surgical Hx:  Past Medical History reviewed by me as above   Past Surgical History reviewed by me as above   Home Medications: Medication Instructions Last Modified Date/Time  Fioricet 300 mg-50 mg-40 mg oral capsule 2 cap(s) orally every 6 hours, As Needed - for Headache 10-Oct-15 11:56  metoprolol tartrate 50 mg oral tablet 1 tab(s) orally 2 times a day 10-Oct-15 01:12   Allergies:  Eggs: Anaphylaxis  Latex: Anaphylaxis  Lyrica: Agitation  Allergies:  Allergies eggs   Social/Family History: Employment Status: currently employed  Lives With: children  Living Arrangements: apartment  Social  History: no tob, no EtoH, no illicits  Family History: no seizures or stroke   Vital Signs: **Vital Signs.:   10-Oct-15 13:33  Vital Signs Type Routine  Temperature Temperature (F) 98.3  Celsius 36.8  Temperature Source oral  Pulse Pulse 69  Respirations Respirations 19  Systolic BP Systolic BP 233  Diastolic BP (mmHg) Diastolic BP (mmHg) 66  Mean BP 78  Pulse Ox % Pulse Ox % 98  Pulse Ox Activity Level  At rest  Oxygen Delivery Room Air/ 21 %   Physical Exam: General: overweight, mild distress  HEENT: normocephalic, sclera nonicteric, oropharynx clear, no papilledema and normal fundus right eye, 20/20 left eye, light perception right eye  Neck: supple, no JVD, no bruits  Chest: CTA B, no wheezing, good movement  Cardiac: RRR, no murmurs, no edema, 2+ pulses  Extremities: no C/C/E, FROM   Neurologic Exam: Mental Status: alert and oriented x 3, normal speech and language, follows complex commands  Cranial Nerves: PERRLA, EOMI, nl VF, face symmetric, tongue midline, shoulder shrug equal  Motor Exam: 5/5 B normal, tone, no tremor  Deep Tendon Reflexes: 3+/4 B, downgoing plantars  Sensory Exam: pinprick, temperature, and vibration intact B  Coordination: FTN and HTS WNL, nl RAM, nl gait   Lab Results: Routine Chem:  09-Oct-15 16:40   Glucose, Serum 96  BUN 12  Creatinine (comp) 0.75  Sodium, Serum 144  Potassium, Serum 3.7  Chloride, Serum  111  CO2, Serum 25  Calcium (Total), Serum  8.4  Anion Gap 8  Osmolality (calc) 286  eGFR (African American) >60  eGFR (Non-African American) >60 (eGFR values <1m/min/1.73 m2 may be an indication of chronic kidney disease (CKD). Calculated eGFR, using the MRDR Study equation, is useful in  patients with stable renal function. The eGFR calculation will not be reliable in acutely ill patients when serum creatinine is changing rapidly. It is not useful in patients on dialysis. The eGFR calculation may not be applicable to  patients at the low and high extremes of body sizes, pregnant women, and vetetarians.)  B-Type Natriuretic Peptide ([facility]  186 (Result(s) reported on 27 Aug 2014 at 05:16PM.)  10-Oct-15 04:39   Cholesterol, Serum 149  Triglycerides, Serum  278  HDL (INHOUSE)  22  VLDL Cholesterol Calculated  56  LDL Cholesterol Calculated 71 (Result(s) reported on 28 Aug 2014 at 05:52AM.)  Cardiac:  09-Oct-15 16:40   Troponin I < 0.02 (0.00-0.05 0.05 ng/mL or less: NEGATIVE  Repeat testing in 3-6 hrs  if clinically indicated. >0.05 ng/mL: POTENTIAL  MYOCARDIAL INJURY. Repeat  testing in 3-6 hrs if  clinically indicated. NOTE: An increase or decrease  of 30% or more on serial  testing suggests a  clinically important change)  Routine Sero:  10-Oct-15 00:27   Pregnancy Test, Urine NEGATIVE (The results of the qualitative urine HCG (Pregnancy Test) should be evaluated in light of other clinical information.  There are limitations to the test which, in certain clinical situations, may result in a false positive or negative result. Thehigh dose hook effect can occur in urine samples with extremely high HCG concentrations.  This effect can produce a negative result in certain situations. It is suggested that results of the qualitative HCG be confirmed by an alternate methodology, such as the quantitative serum beta HCG test.)  Routine Coag:  09-Oct-15 16:40   D-Dimer, Quantitative 251 (INTERPRETATION <> Exclusion of Venous Thromboembolism (VTE) - OUTPATIENT ONLY       (Emergency Department or [place])             0-499 ng/ml (FEU)  : With a low to intermediate pretest                                  probability for VTE this test result                                  excludes the diagnosis of VTE.             > 499 ng/ml (FEU)  : VTE not excluded; additional work up                                  for VTE is required. <> Testing on Inpatients and Evaluation of Disseminated Intravascular         Coagulation (DIC)             Reference Range:  0-499 ng/ml (FEU))  Routine Hem:  09-Oct-15 16:40   WBC (CBC) 7.4  RBC (CBC)  3.76  Hemoglobin (CBC)  11.7  Hematocrit (CBC) 35.7  Platelet Count (CBC) 237 (Result(s) reported on 27 Aug 2014 at 04:54PM.)  MCV 95  MCH 31.0  MCHC 32.6  RDW 13.0  Radiology Results: MRI:    10-Oct-15 11:04, MRI Brain Without Contrast  MRI Brain Without Contrast   REASON FOR EXAM:    CVA  COMMENTS:       PROCEDURE: MR  - MR BRAIN WO CONTRAST  - Aug 28 2014 11:04AM     CLINICAL DATA:  33 year old female with severe headache for 3 days  associated with loss of vision in the right eye. Initial encounter.    EXAM:  MRI HEAD WITHOUT CONTRAST    TECHNIQUE:  Multiplanar, multiecho pulse sequences of the brain and surrounding  structures were obtained without intravenous contrast.  COMPARISON:  Intracranial MRV 1040 hr the same day. Head CT without  contrast 08/27/2014 and earlier.    FINDINGS:  Due to patient nausea at the time of imaging, rapid imaging  sequences were utilized, some results in suboptimal resolution.    There is cerebellar tonsillar ectopia, present since 2011 with the  cerebellar tonsils extending up to 10 mm below the plane of the  cisterna magna (series 9, image 11). At the same time, the  suprasellar cistern appears preserved and the pituitary appears  diminutive. No extra-axial collection. Grossly negative visualized  upper cervical spine and spinal cord.    No restricted diffusion to suggest acute infarction. No midline  shift, mass effect, evidence of mass lesion, ventriculomegaly,  extra-axial collection or acute intracranial hemorrhage. Major  intracranial vascular flow voidsare preserved. Grossly normal gray  and white matter signal throughout the brain.    Left mastoid effusion, present since 2014. Negative visualized  nasopharynx. Right mastoids are clear and other internal auditory  structures appear grossly  normal. Paranasal sinuses are clear.    Visualized orbit soft tissues are within normal limits. Visualized  scalp soft tissues are within normal limits.     IMPRESSION:  1. Chronic cerebellar tonsillar ectopia. This can be seen with  spontaneous intracranial hypotension, but there are no other  secondary findings of that entity. 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 Correlation with CSF  opening pressure would confirm.  2. Otherwise negative non contrast MRI appearance of the brain.  3. Left mastoid effusion since 2014, most often a postinflammatory  finding.      Electronically Signed    By: Lars Pinks M.D.    On: 08/28/2014 11:39         Verified By: Gwenyth Bender. HALL, M.D.,  CT:    09-Oct-15 21:06, CT Head Without Contrast  CT Head Without Contrast   REASON FOR EXAM:    headache, vision change  COMMENTS:       PROCEDURE: CT  - CT HEAD WITHOUT CONTRAST  - Aug 27 2014  9:06PM     CLINICAL DATA:  Headache, chest pain, shortness of Breath    EXAM:  CT HEAD WITHOUT CONTRAST    TECHNIQUE:  Contiguous axial images were obtained from the base of the skull  through the vertex without intravenous contrast.    COMPARISON:  01/19/2013  FINDINGS:  No skull fracture is noted. Paranasal sinuses and mastoid air cells  are unremarkable. No intracranial hemorrhage, mass effect or midline  shift. No hydrocephalus. No acute infarction. No mass lesion is  noted on this unenhanced scan.     IMPRESSION:  No acute intracranial abnormality.  No significant change.      Electronically Signed    By: Lahoma Crocker M.D.    On: 08/27/2014 21:17  Verified By: Ephraim Hamburger, M.D.,   Radiology Impression: Radiology Impression: MRI of brain personally reviewed by me and normal   Impression/Recommendations: Recommendations:   labs reviewed by me notes reviewed by me   Monocular vision loss OD-  etiology is concerning for possible optic neuritis;  this  could also be opthtaloplegic migraine but these are very rare Migraine-  still very symptomatic MRI of brain w/ contrast as well as orbits Solumedrol 1gm IV daily x 3 days but can stop if repeat MRI is normal magnesium sulfate 510m q6h, reglan 545mq6h and toradol 1564m6h benadryl 12.5mg32m x 1 stop narcotics will follow   Electronic Signatures: SmitJamison Neighbor)  (Signed 10-Oct-15 18:31)  Authored: REFERRING PHYSICIAN, Primary Care Physician, Consult, History of Present Illness, Review of Systems, PAST MEDICAL/SURGICAL HISTORY, HOME MEDICATIONS, ALLERGIES, Social/Family History, NURSING VITAL SIGNS, Physical Exam-, LAB RESULTS, RADIOLOGY RESULTS, Recommendations   Last Updated: 10-Oct-15 18:31 by SmitJamison Neighbor)

## 2015-03-12 NOTE — Discharge Summary (Signed)
PATIENT NAME:  Katherine Murray, Katherine Murray MR#:  [id number] DATE OF BIRTH:  Nov 17, 1981  DATE OF ADMISSION:  08/28/2014 DATE OF DISCHARGE:  08/28/2014   ADMISSION DIAGNOSIS: Acute vision loss in the right eye.   DISCHARGE DIAGNOSES:  1.  Complicated migraine.  2.  Tachycardia on metoprolol.   CONSULTATIONS: Neurology.   DIAGNOSTIC DATA: MRI of the brain shows chronic cerebellar tonsillar ectopia; this can be seen with spontaneous intracranial hypotension or a Chiari I malformation; otherwise unremarkable.   MRV was negative.   LDL 71, cholesterol 149.   CT of the head showed no acute intracranial hemorrhage or CVA.   HOSPITAL COURSE: The patient is a 34 year old female with a history of migraines who was sitting in the ER when she had sudden onset of right eye vision loss. For further details, please refer to the H and P.  1.  Complicated migraine. The patient presented with to the ER with an issue of migraines for 3 days. She then developed the sudden onset of acute right vision loss. This has now subsequently resolved, and likely is a complication of her complex migraine. Ophthalmology and neurology have been called for evaluation. MRI and MRV essentially showed no acute changes.  2.  Migraine headaches. The patient will be discharged with Fioricet and have followup with [facility].   PHYSICAL EXAMINATION:  VITAL SIGNS: On exam at discharge, temperature 98.2, pulse 77, respiratory rate 18, blood pressure 100/65, oxygen saturation 98% on room air.  GENERAL: The patient is alert, oriented. Not in acute distress. CARDIOVASCULAR: Regular rate and rhythm. No murmurs, gallops, or rubs. PMI is not displaced.  LUNGS: Clear to auscultation without crackles, rales, rhonchi, or wheezing. Normal to percussion.  ABDOMEN: Bowel sounds are present. Nontender, nondistended, no hepatosplenomegaly.   EXTREMITIES: No clubbing, cyanosis or edema.   NEUROLOGIC: Cranial nerves II through XII are intact.  There are no focal deficits. No nuchal rigidity.   DISCHARGE MEDICATIONS:  1.  Fioricet 2 tablets q. 6 hours p.r.n. headache.  2.  Metoprolol 50 mg b.i.d.   DISCHARGE DIET: Regular diet.   DISCHARGE ACTIVITY: As tolerated.   DISCHARGE FOLLOWUP: The patient will need a follow-up with [facility] in 2 weeks.  TIME SPENT: 35 minutes.    ____________________________ Donell Beers. Benjie Karvonen, MD spm:MT D: 08/28/2014 12:01:00 ET T: 08/28/2014 13:27:37 ET JOB#: [id number]  cc: Katherine Murray P. Benjie Karvonen, MD, <Dictator> [facility] Katherine Murray P Katherine Carillo MD ELECTRONICALLY SIGNED 08/29/2014 11:24

## 2015-03-12 NOTE — H&P (Signed)
PATIENT NAME:  Katherine Murray, Katherine Murray MR#:  [id number] DATE OF BIRTH:  September 27, 1981  DATE OF ADMISSION:  08/27/2014  REFERRING PHYSICIAN:  Brunilda Payor A. Edd Fabian, MD.  PRIMARY CARE PHYSICIAN:  Kerin Perna, MD.  CHIEF COMPLAINT:  Headache.    HISTORY OF PRESENT ILLNESS:  A 34 year old Caucasian female with past medical history of palpitations as well as migraines, presenting with headache. She describes 3 day duration of headache, right frontal in location, radiation to the occipital as well as neck,.  She describes quality as throbbing intensity 10/10,  worsened by bright lights and loud noise, found no relieving factors. Progressively worsening over the last 3 days' duration. She has noticed associated blurred vision of the right eye for 1 day duration; however, prior to coming to the hospital she has complete vision loss on the right eye.  She said that she now can only see blackness, no motion or light, thus prompting visit to the hospital for further work-up and evaluation via the Emergency Department.  ER staff discussed the case with neurology as well as ophthalmology who recommended admission, as well as further work-up including MRI, MRV She is still complaining of headache; however, intensity has decreased.   REVIEW OF SYSTEMS:   CONSTITUTIONAL: Denies fevers, chills, fatigue.  EYES: Positive for vision loss of the right eye.  Denies any eye pain or redness.  EARS, NOSE, THROAT: Denies tinnitus, ear pain, hearing loss.  RESPIRATORY: Denies cough, wheeze, shortness of breath.  CARDIOVASCULAR: Denies chest pain, palpitations, edema.  GASTROINTESTINAL: Denies nausea, vomiting, diarrhea, abdominal pain.   GENITOURINARY: Denies dysuria, hematuria.  ENDOCRINE: Denies nocturia or thyroid problems.  HEMATOLOGIC/LYMPHATIC: Denies easy bruising, bleeding.  SKIN: Denies rash or lesion.  MUSCULOSKELETAL: Denies pain in neck, back, shoulder, knees, hips or arthritic symptoms.  NEUROLOGIC: Positive for  headache as described above. Denies any weakness, dysarthria or tremors.  PSYCHIATRIC:  Denies anxiety or depressive symptoms.   Other full review of systems performed by me is negative.   PAST MEDICAL HISTORY: Palpitations as well as migraines.   SOCIAL HISTORY: Positive for tobacco use. Denies any alcohol or drug usage.   FAMILY HISTORY: Positive for coronary artery disease as well as type 2 diabetes. No history of CVA.   ALLERGIES: LYRICA, EGGS AS WELL AS LATEX.   HOME MEDICATIONS: Include metoprolol 50 mg p.o. b.i.d.   PHYSICAL EXAMINATION:  VITAL SIGNS: Temperature 99.1, heart rate 82, respirations 20, blood pressure 115/61, saturation 100% on room air. Weight 91.6 kg, BMI of 33.6.  GENERAL: Well-nourished, well-developed, Caucasian female currently in no acute distress.  HEAD: Normocephalic, atraumatic.  EYES: Pupils equal, round, and reactive to light. Extraocular muscles intact.  Visual fields of left eye intact.  She essentially has no visual acuity of the right eye stating that she can only see blackness at this time. No pain on extraocular movements.  MOUTH: Moist mucosal membrane. Dentition intact. No abscess noted.  EAR, NOSE, THROAT: Clear without exudate.  No external lesions.  NECK: Supple. No thyromegaly. No nodules. No JVD.  PULMONARY: Clear to auscultation bilaterally without wheezes, rales or rhonchi. No use of accessory muscles. Good respiratory effort.  CHEST: Nontender to palpation.  CARDIOVASCULAR: S1, S2, regular rate and rhythm. No murmurs, rubs, or gallops. No edema. Pedal pulses 2+ bilaterally.  GASTROINTESTINAL: Soft, nontender, nondistended. No masses.  Positive bowel sounds.  No hepatosplenomegaly.  MUSCULOSKELETAL: No swelling, clubbing, or edema. Range of motion full in all extremities.  NEUROLOGIC: Cranial nerves II through XII intact.  No gross focal neurological deficits other than visual acuity mentioned above. Sensation intact. Reflexes intact.  Strength 5/5 in all extremities including proximal and distal flexion and extension. Pronator drift within normal limits. Gait deferred at this time.  SKIN: No ulceration, lesions, rashes, cyanosis. Skin warm, dry. Turgor intact.  PSYCHIATRIC: Mood and affect within normal limits. awake alert, oriented x3. Insight and judgment intact.   EKG performed,  normal sinus rhythm. No ST or T wave abnormalities.   LABORATORY DATA: Sodium 144, potassium 3.7, chloride 111, bicarbonate of 25, BUN 12, creatinine 0.75, glucose 186. Troponin less than 0.02. WBC 7.4, hemoglobin of 11.7, platelets of 237,000.    Chest x-ray performed reveals no acute cardiopulmonary process. CT head performed reveals no acute intracranial process.   ASSESSMENT AND PLAN: A 34 year old Caucasian female with history of palpitations, migraines presenting with headache with acute onset of right vision loss.   1. Acute onset of right vision loss, likely complication of complex migraine per ophthalmology.  We will formally consult them as they have already stated they will come and evaluate the patient.  We will also get neurology consult; however, they recommended getting an MRI as well an MRV We will also dose aspirin now.  2. Intractable headache/migraine.  Provide p.r.n. pain medication as well as the addition of Depakote per neurology.  3. Venous thromboembolism prophylaxis with sequential compression devices.   4. The patient is full code.   TIME SPENT: 45 minutes   ____________________________ Aaron Mose. Mai Longnecker, MD dkh:by D: 08/27/2014 23:49:55 ET T: 08/28/2014 00:06:29 ET JOB#: [id number]  cc: Aaron Mose. Hilari Wethington, MD, <Dictator> Niaja Stickley Woodfin Ganja MD ELECTRONICALLY SIGNED 08/28/2014 20:34

## 2015-03-12 NOTE — Discharge Summary (Signed)
PATIENT NAME:  Katherine Murray, Katherine Murray MR#:  [id number] DATE OF BIRTH:  Oct 18, 1981  DATE OF ADMISSION:  08/28/2014 DATE OF DISCHARGE:  08/29/2014  ADDENDUM: The patient actually stayed in the hospital for 1 more day as per neurology. An MRI of the orbits was ordered to evaluate for optic neuritis; however, she left AMA on 10/11 before being seen.    ____________________________ Donell Beers. Benjie Karvonen, MD spm:TT D: 08/29/2014 10:59:37 ET T: 08/29/2014 16:38:31 ET JOB#: [id number]  cc: Arionne Iams P. Benjie Karvonen, MD, <Dictator> Donell Beers Wanya Bangura MD ELECTRONICALLY SIGNED 08/29/2014 21:25

## 2015-05-06 ENCOUNTER — Ambulatory Visit
Admission: EM | Admit: 2015-05-06 | Discharge: 2015-05-06 | Disposition: A | Discharge status: Home / Self Care | Payer: Medicaid Other | Attending: Family Medicine | Admitting: Family Medicine

## 2015-05-06 ENCOUNTER — Encounter: Payer: Self-pay | Admitting: Emergency Medicine

## 2015-05-06 DIAGNOSIS — W57XXXA Bitten or stung by nonvenomous insect and other nonvenomous arthropods, initial encounter: Secondary | ICD-10-CM | POA: Diagnosis not present

## 2015-05-06 DIAGNOSIS — T148 Other injury of unspecified body region: Secondary | ICD-10-CM

## 2015-05-06 HISTORY — DX: Headache, unspecified: R51.9

## 2015-05-06 HISTORY — DX: Headache: R51

## 2015-05-06 MED ORDER — SULFAMETHOXAZOLE-TRIMETHOPRIM 800-160 MG PO TABS: 1.0000 | ORAL_TABLET | Freq: Two times a day (BID) | ORAL | Status: AC

## 2015-05-06 NOTE — ED Provider Notes (Signed)
Patient presents today with symptoms of right lateral thigh insect bite. Patient patient believes that she was bit by a spider earlier today the area is now red and tender to touch and slightly bruised. She denies any tick bites. She denies any fever, chills. She denies any other areas on her body with a similar rash.  Review systems negative except mentioned above. Vitals as per chart. Gen.-No apparent distress HEENT-no pharyngeal erythema, no exudate Respiratory-CTA bilateral Cardiac-regular rate rhythm Skin-slightly raised erythematous area on the right lateral thigh extending about 2 inches, tenderness to touch, no discharge from site, no streaks  A/P: Insect bite-Bactrim DS, continue Zyrtec, keep area clean and dry, if any symptoms persist or worsen seek medical attention.  Paulina Fusi, MD 05/06/15 [phone]

## 2015-05-06 NOTE — Discharge Instructions (Signed)
Keep area clean and dry. Antihistamine as needed. Seek medical attention if symptoms persist or worsen.

## 2015-05-06 NOTE — ED Notes (Signed)
Has noticed a rash on right thigh . Red, painful, swollen. Noticed today while at pool. Saw spider in the chair. Fever was 101.3, headache

## 2015-06-15 ENCOUNTER — Encounter: Payer: Self-pay | Admitting: Emergency Medicine

## 2015-06-15 ENCOUNTER — Ambulatory Visit
Admission: EM | Admit: 2015-06-15 | Discharge: 2015-06-15 | Disposition: A | Discharge status: Home / Self Care | Payer: Medicaid Other | Attending: Internal Medicine | Admitting: Internal Medicine

## 2015-06-15 DIAGNOSIS — G43919 Migraine, unspecified, intractable, without status migrainosus: Secondary | ICD-10-CM | POA: Diagnosis not present

## 2015-06-15 MED ORDER — ONDANSETRON 4 MG PO TBDP
4.0000 mg | ORAL_TABLET | Freq: Three times a day (TID) | ORAL | Status: DC | PRN
Start: 2015-06-15 — End: 2016-04-19

## 2015-06-15 MED ORDER — BUTALBITAL-APAP-CAFFEINE 50-325-40 MG PO TABS: 1.0000 | ORAL_TABLET | Freq: Four times a day (QID) | ORAL | Status: AC | PRN

## 2015-06-15 MED ORDER — ONDANSETRON 8 MG PO TBDP
8.0000 mg | ORAL_TABLET | Freq: Once | ORAL | Status: AC
  Administered 2015-06-15: 8 mg via ORAL

## 2015-06-15 MED ORDER — KETOROLAC TROMETHAMINE 60 MG/2ML IM SOLN
60.0000 mg | Freq: Once | INTRAMUSCULAR | Status: AC
  Administered 2015-06-15: 60 mg via INTRAMUSCULAR

## 2015-06-15 NOTE — Discharge Instructions (Signed)

## 2015-06-15 NOTE — ED Notes (Signed)
Patient states she has had a migraine head ache for 3 days and is out of migraine medicine.

## 2015-06-15 NOTE — ED Provider Notes (Addendum)
CSN: [id number]     Arrival date & time 06/15/15  1858 History   None    Chief Complaint  Patient presents with  . Migraine   (Consider location/radiation/quality/duration/timing/severity/associated sxs/prior Treatment) HPI   34 year old female who presents with migraine in about 3 days ago and she states she is out of her migraine medicine which is Fioricet which usually works well for her. She also states that she called her primary care physician, he was not available, and the office told her that they would find someone to get back with her but she never received a call in return. She thinks that heat triggers her migraines. She states this is her second worst migraine she's ever had with vomiting and double vision she has had in the past. She states that usually she is able to catch her migraine early in its course that she does not have the severity that she is having today. Besides her history, she does not have any other neurological symptoms except for photosensitivity. Reviewed with the [place] substance abuse site shows her to have repetitive large prescriptions for necrotic medication. She states she is taking because of a back injury.  Past Medical History  Diagnosis Date  . A-fib   . Gallstones   . Kidney stones   . Pneumonia   . Diverticulosis   . Headache    Past Surgical History  Procedure Laterality Date  . Cholecystectomy    . Cesarean section    . Tonsillectomy    . Gallbladder surgery     Family History  Problem Relation Age of Onset  . Colon cancer Paternal Grandmother   . Ulcerative colitis    . Diabetes    . Kidney disease    . Stomach cancer Maternal Grandfather    History  Substance Use Topics  . Smoking status: Current Some Day Smoker -- 0.50 packs/day    Types: Cigarettes  . Smokeless tobacco: Never Used     Comment: smokes "once or twice a week"  . Alcohol Use: No   OB History    No data available     Review of Systems  All other  systems reviewed and are negative.   Allergies  Lyrica; Eggs or egg-derived products; and Latex  Home Medications   Prior to Admission medications   Medication Sig Start Date End Date Taking? Authorizing Provider  butalbital-acetaminophen-caffeine (FIORICET) [id number] MG per tablet Take 1-2 tablets by mouth every 6 (six) hours as needed for headache. 06/15/15 06/14/16  Lorin Picket, PA-C  dicyclomine (BENTYL) 20 MG tablet Take 1 tablet (20 mg total) by mouth 2 (two) times daily. 04/04/14   Orpah Greek, MD  glucagon (GLUCAGON EMERGENCY) 1 MG injection Inject into the muscle. 07/07/12   Historical Provider, MD  medroxyPROGESTERone (DEPO-PROVERA) 150 MG/ML injection Inject 150 mg into the muscle every 3 (three) months.    Historical Provider, MD  metoprolol (LOPRESSOR) 50 MG tablet Take 50 mg by mouth 2 (two) times daily.    Historical Provider, MD  ondansetron (ZOFRAN ODT) 4 MG disintegrating tablet Take 1 tablet (4 mg total) by mouth every 8 (eight) hours as needed for nausea or vomiting. 06/15/15   Lorin Picket, PA-C  pantoprazole (PROTONIX) 40 MG tablet Take 1 tablet (40 mg total) by mouth 2 (two) times daily before a meal. 04/21/14   Milus Banister, MD  promethazine (PHENERGAN) 25 MG tablet Take 1 tablet (25 mg total) by mouth every 6 (six) hours  as needed for nausea or vomiting. 04/04/14   Orpah Greek, MD  ranitidine (ZANTAC) 150 MG tablet Take 1 tablet (150 mg total) by mouth 2 (two) times daily. 04/04/14   Orpah Greek, MD  sucralfate (CARAFATE) 1 GM/10ML suspension Take 10 mLs (1 g total) by mouth 4 (four) times daily -  with meals and at bedtime. 04/04/14   Orpah Greek, MD   BP 106/64 mmHg  Pulse 121  Temp(Src) 98.3 F (36.8 C) (Oral)  Resp 18  Ht 5\' 5"  (1.651 m)  Wt 194 lb (87.998 kg)  BMI 32.28 kg/m2  SpO2 100% Physical Exam  Constitutional: She is oriented to person, place, and time. She appears well-developed and well-nourished.  HENT:   Head: Normocephalic and atraumatic.  Right Ear: External ear normal.  Left Ear: External ear normal.  Mouth/Throat: Oropharynx is clear and moist.  Eyes: EOM are normal. Pupils are equal, round, and reactive to light. Right eye exhibits no discharge. Left eye exhibits no discharge.  Neck: Normal range of motion. Neck supple.  Pulmonary/Chest: Effort normal and breath sounds normal. No respiratory distress. She has no wheezes. She has no rales. She exhibits no tenderness.  Musculoskeletal: Normal range of motion. She exhibits no edema or tenderness.  Lymphadenopathy:    She has no cervical adenopathy.  Neurological: She is alert and oriented to person, place, and time. She has normal reflexes. She displays normal reflexes. No cranial nerve deficit. She exhibits normal muscle tone. Coordination normal.  Skin: Skin is warm and dry.  Psychiatric: She has a normal mood and affect. Her behavior is normal. Judgment and thought content normal.  Nursing note and vitals reviewed.   ED Course  Procedures (including critical care time) Labs Review Labs Reviewed - No data to display  Imaging Review No results found.  19:51 Medication Given NW  ketorolac (TORADOL) injection 60 mg - Dose: 60 mg ; Route: Intramuscular ; Site: Left Ventrogluteal ; Scheduled Time: 2000      MDM   1. Intractable migraine without status migrainosus, unspecified migraine type    New Prescriptions   BUTALBITAL-ACETAMINOPHEN-CAFFEINE (FIORICET) 50-325-40 MG PER TABLET    Take 1-2 tablets by mouth every 6 (six) hours as needed for headache.   ONDANSETRON (ZOFRAN ODT) 4 MG DISINTEGRATING TABLET    Take 1 tablet (4 mg total) by mouth every 8 (eight) hours as needed for nausea or vomiting.   Plan: 1. Diagnosis reviewed with patient 2. rx as per orders; risks, benefits, potential side effects reviewed with patient 3. Recommend supportive treatment with rest,fluids.  4. F/u prn if symptoms worsen or don't  improve    Lorin Picket, PA-C 06/15/15 [address] [place], [place] 07/14/15 1422

## 2015-07-26 ENCOUNTER — Emergency Department: Payer: Medicaid Other

## 2015-07-26 ENCOUNTER — Encounter: Payer: Self-pay | Admitting: Emergency Medicine

## 2015-07-26 ENCOUNTER — Emergency Department
Admission: EM | Admit: 2015-07-26 | Discharge: 2015-07-26 | Disposition: A | Discharge status: Home / Self Care | Payer: Medicaid Other | Attending: Emergency Medicine | Admitting: Emergency Medicine

## 2015-07-26 DIAGNOSIS — R109 Unspecified abdominal pain: Secondary | ICD-10-CM | POA: Diagnosis present

## 2015-07-26 DIAGNOSIS — Z91040 Latex allergy status: Secondary | ICD-10-CM | POA: Insufficient documentation

## 2015-07-26 DIAGNOSIS — Z3202 Encounter for pregnancy test, result negative: Secondary | ICD-10-CM | POA: Insufficient documentation

## 2015-07-26 DIAGNOSIS — N739 Female pelvic inflammatory disease, unspecified: Secondary | ICD-10-CM | POA: Insufficient documentation

## 2015-07-26 DIAGNOSIS — A5901 Trichomonal vulvovaginitis: Secondary | ICD-10-CM | POA: Insufficient documentation

## 2015-07-26 DIAGNOSIS — Z720 Tobacco use: Secondary | ICD-10-CM | POA: Insufficient documentation

## 2015-07-26 DIAGNOSIS — R1032 Left lower quadrant pain: Secondary | ICD-10-CM

## 2015-07-26 DIAGNOSIS — Z79899 Other long term (current) drug therapy: Secondary | ICD-10-CM | POA: Insufficient documentation

## 2015-07-26 DIAGNOSIS — A599 Trichomoniasis, unspecified: Secondary | ICD-10-CM

## 2015-07-26 LAB — COMPREHENSIVE METABOLIC PANEL
ALBUMIN: 4.9 g/dL (ref 3.5–5.0)
ALK PHOS: 44 U/L (ref 38–126)
ALT: 35 U/L (ref 14–54)
ANION GAP: 6 (ref 5–15)
AST: 32 U/L (ref 15–41)
BUN: 11 mg/dL (ref 6–20)
CALCIUM: 9.5 mg/dL (ref 8.9–10.3)
CO2: 26 mmol/L (ref 22–32)
Chloride: 110 mmol/L (ref 101–111)
Creatinine, Ser: 0.69 mg/dL (ref 0.44–1.00)
GFR calc Af Amer: >60 mL/min (ref >60)
GFR calc non Af Amer: >60 mL/min (ref >60)
GLUCOSE: 103 mg/dL — AB (ref 65–99)
POTASSIUM: 3.7 mmol/L (ref 3.5–5.1)
Sodium: 142 mmol/L (ref 135–145)
TOTAL PROTEIN: 7.3 g/dL (ref 6.5–8.1)
Total Bilirubin: 0.4 mg/dL (ref 0.3–1.2)

## 2015-07-26 LAB — WET PREP, GENITAL: YEAST WET PREP: NONE SEEN

## 2015-07-26 LAB — CBC WITH DIFFERENTIAL/PLATELET
BASOS PCT: 1 %
Basophils Absolute: 0 10*3/uL (ref 0–0.1)
Eosinophils Absolute: 0.1 10*3/uL (ref 0–0.7)
Eosinophils Relative: 3 %
HEMATOCRIT: 39.2 % (ref 35.0–47.0)
Hemoglobin: 13.1 g/dL (ref 12.0–16.0)
LYMPHS ABS: 1.9 10*3/uL (ref 1.0–3.6)
Lymphocytes Relative: 39 %
MCH: 31.7 pg (ref 26.0–34.0)
MCHC: 33.6 g/dL (ref 32.0–36.0)
MCV: 94.4 fL (ref 80.0–100.0)
MONO ABS: 0.4 10*3/uL (ref 0.2–0.9)
MONOS PCT: 7 %
Neutro Abs: 2.5 10*3/uL (ref 1.4–6.5)
Neutrophils Relative %: 50 %
Platelets: 212 10*3/uL (ref 150–440)
RBC: 4.15 MIL/uL (ref 3.80–5.20)
RDW: 13.1 % (ref 11.5–14.5)
WBC: 4.9 10*3/uL (ref 3.6–11.0)

## 2015-07-26 LAB — URINALYSIS COMPLETE WITH MICROSCOPIC (ARMC ONLY)
BACTERIA UA: NONE SEEN
BILIRUBIN URINE: NEGATIVE
Glucose, UA: NEGATIVE mg/dL
LEUKOCYTES UA: NEGATIVE
Nitrite: NEGATIVE
PH: 5 (ref 5.0–8.0)
PROTEIN: 30 mg/dL — AB
Specific Gravity, Urine: 1.03 (ref 1.005–1.030)

## 2015-07-26 LAB — LIPASE, BLOOD: Lipase: 15 U/L — ABNORMAL LOW (ref 22–51)

## 2015-07-26 LAB — CHLAMYDIA/NGC RT PCR (ARMC ONLY)
Chlamydia Tr: NOT DETECTED
N gonorrhoeae: NOT DETECTED

## 2015-07-26 LAB — PREGNANCY, URINE: PREG TEST UR: NEGATIVE

## 2015-07-26 MED ORDER — OXYCODONE-ACETAMINOPHEN 5-325 MG PO TABS
1.0000 | ORAL_TABLET | Freq: Once | ORAL | Status: AC
  Administered 2015-07-26: 1 via ORAL
  Filled 2015-07-26: qty 1

## 2015-07-26 MED ORDER — ONDANSETRON HCL 4 MG PO TABS
4.0000 mg | ORAL_TABLET | Freq: Once | ORAL | Status: AC
  Administered 2015-07-26: 4 mg via ORAL
  Filled 2015-07-26: qty 1

## 2015-07-26 MED ORDER — DOXYCYCLINE HYCLATE 100 MG PO TABS: 100.0000 mg | ORAL_TABLET | Freq: Two times a day (BID) | ORAL | Status: DC

## 2015-07-26 MED ORDER — METRONIDAZOLE 500 MG PO TABS
500.0000 mg | ORAL_TABLET | Freq: Two times a day (BID) | ORAL | Status: AC
Start: 2015-07-26 — End: 2015-08-09

## 2015-07-26 MED ORDER — LIDOCAINE HCL (PF) 1 % IJ SOLN
INTRAMUSCULAR | Status: AC
  Administered 2015-07-26: 5 mL
  Filled 2015-07-26: qty 5

## 2015-07-26 MED ORDER — METRONIDAZOLE 500 MG PO TABS
500.0000 mg | ORAL_TABLET | Freq: Once | ORAL | Status: AC
  Administered 2015-07-26: 500 mg via ORAL
  Filled 2015-07-26: qty 1

## 2015-07-26 MED ORDER — DOXYCYCLINE HYCLATE 100 MG PO TABS
100.0000 mg | ORAL_TABLET | Freq: Two times a day (BID) | ORAL | Status: DC
  Administered 2015-07-26: 100 mg via ORAL
  Filled 2015-07-26: qty 1

## 2015-07-26 MED ORDER — CEFTRIAXONE SODIUM 250 MG IJ SOLR
250.0000 mg | Freq: Once | INTRAMUSCULAR | Status: AC
  Administered 2015-07-26: 250 mg via INTRAMUSCULAR
  Filled 2015-07-26: qty 250

## 2015-07-26 NOTE — ED Provider Notes (Signed)
[facility] Emergency Department Provider Note  ____________________________________________  Time seen: Approximately 945 AM  I have reviewed the triage vital signs and the nursing notes.   HISTORY  Chief Complaint Abdominal Pain    HPI Katherine Murray is a 34 y.o. female with a history of gallstones and atrial fibrillation who is presenting today with left flank pain that has been ongoing since last Thursday. She says that the pain started high up in her left flank and is now moved down to her left groin is now radiating into the pelvis. She denies any blood in her urine or dysuria. No vaginal discharge or bleeding. Says that she takes Depo-Provera for birth control. She's had nausea and vomiting over the past month and a half with inability to eat secondary to this. She does have a family history of a sister with kidney stones. Says the pain is sharp and intermittent. Has increased since last Thursday.Also difficulty with stooling and has not moved her bowels in 4 days. Past Medical History  Diagnosis Date  . A-fib   . Gallstones   . Kidney stones   . Pneumonia   . Diverticulosis   . Headache     There are no active problems to display for this patient.   Past Surgical History  Procedure Laterality Date  . Cholecystectomy    . Cesarean section    . Tonsillectomy    . Gallbladder surgery      Current Outpatient Rx  Name  Route  Sig  Dispense  Refill  . butalbital-acetaminophen-caffeine (FIORICET) 50-325-40 MG per tablet   Oral   Take 1-2 tablets by mouth every 6 (six) hours as needed for headache.   12 tablet   0   . dicyclomine (BENTYL) 20 MG tablet   Oral   Take 1 tablet (20 mg total) by mouth 2 (two) times daily.   20 tablet   0   . glucagon (GLUCAGON EMERGENCY) 1 MG injection   Intramuscular   Inject into the muscle.         . medroxyPROGESTERone (DEPO-PROVERA) 150 MG/ML injection   Intramuscular   Inject 150 mg into the muscle  every 3 (three) months.         . metoprolol (LOPRESSOR) 50 MG tablet   Oral   Take 50 mg by mouth 2 (two) times daily.         . ondansetron (ZOFRAN ODT) 4 MG disintegrating tablet   Oral   Take 1 tablet (4 mg total) by mouth every 8 (eight) hours as needed for nausea or vomiting.   20 tablet   0   . pantoprazole (PROTONIX) 40 MG tablet   Oral   Take 1 tablet (40 mg total) by mouth 2 (two) times daily before a meal.   60 tablet   3   . promethazine (PHENERGAN) 25 MG tablet   Oral   Take 1 tablet (25 mg total) by mouth every 6 (six) hours as needed for nausea or vomiting.   30 tablet   0   . ranitidine (ZANTAC) 150 MG tablet   Oral   Take 1 tablet (150 mg total) by mouth 2 (two) times daily.   60 tablet   0   . sucralfate (CARAFATE) 1 GM/10ML suspension   Oral   Take 10 mLs (1 g total) by mouth 4 (four) times daily -  with meals and at bedtime.   420 mL   0  Allergies Lyrica; Eggs or egg-derived products; and Latex  Family History  Problem Relation Age of Onset  . Colon cancer Paternal Grandmother   . Ulcerative colitis    . Diabetes    . Kidney disease    . Stomach cancer Maternal Grandfather     Social History Social History  Substance Use Topics  . Smoking status: Current Some Day Smoker -- 0.50 packs/day    Types: Cigarettes  . Smokeless tobacco: Never Used     Comment: smokes "once or twice a week"  . Alcohol Use: No    Review of Systems Constitutional: No fever/chills Eyes: No visual changes. ENT: No sore throat. Cardiovascular: Denies chest pain. Respiratory: Denies shortness of breath. Gastrointestinal: No diarrhea.   Genitourinary: Negative for dysuria. Musculoskeletal: Negative for back pain. Skin: Negative for rash. Neurological: Negative for headaches, focal weakness or numbness.  10-point ROS otherwise negative.  ____________________________________________   PHYSICAL EXAM:  VITAL SIGNS: ED Triage Vitals  Enc Vitals  Group     BP 07/26/15 0814 127/67 mmHg     Pulse Rate 07/26/15 0814 81     Resp 07/26/15 0814 18     Temp 07/26/15 0814 98.6 F (37 C)     Temp Source 07/26/15 0814 Oral     SpO2 07/26/15 0814 100 %     Weight 07/26/15 0814 180 lb (81.647 kg)     Height 07/26/15 0814 5\' 5"  (1.651 m)     Head Cir --      Peak Flow --      Pain Score 07/26/15 0814 9     Pain Loc --      Pain Edu? --      Excl. in [place]? --     Constitutional: Alert and oriented. Well appearing and in no acute distress. Eyes: Conjunctivae are normal. PERRL. EOMI. Head: Atraumatic. Nose: No congestion/rhinnorhea. Mouth/Throat: Mucous membranes are moist.  Oropharynx non-erythematous. Neck: No stridor.   Cardiovascular: Normal rate, regular rhythm. Grossly normal heart sounds.  Good peripheral circulation. Respiratory: Normal respiratory effort.  No retractions. Lungs CTAB. Gastrointestinal: Soft with mild to moderate tenderness in the left upper left lower quadrant. No rebound or guarding.. No distention. No abdominal bruits. No CVA tenderness. Genitourinary: Normal external exam. Speculum exam with foaming white discharge. Left pelvic pain with CMT. Mid to left pelvic tenderness on bimanual exam without any masses palpated. Musculoskeletal: No lower extremity tenderness nor edema.  No joint effusions. Neurologic:  Normal speech and language. No gross focal neurologic deficits are appreciated. No gait instability. Skin:  Skin is warm, dry and intact. No rash noted. Psychiatric: Mood and affect are normal. Speech and behavior are normal.  ____________________________________________   LABS (all labs ordered are listed, but only abnormal results are displayed)  Labs Reviewed  COMPREHENSIVE METABOLIC PANEL - Abnormal; Notable for the following:    Glucose, Bld 103 (*)    All other components within normal limits  LIPASE, BLOOD - Abnormal; Notable for the following:    Lipase 15 (*)    All other components within  normal limits  URINALYSIS COMPLETEWITH MICROSCOPIC ([facility] ONLY) - Abnormal; Notable for the following:    Color, Urine YELLOW (*)    APPearance CLEAR (*)    Ketones, ur TRACE (*)    Hgb urine dipstick 2+ (*)    Protein, ur 30 (*)    Squamous Epithelial / LPF 0-5 (*)    All other components within normal limits  CBC WITH DIFFERENTIAL/PLATELET  PREGNANCY, URINE   ____________________________________________  EKG   ____________________________________________  RADIOLOGY  CT of the abdomen and pelvis without any hydronephrosis or urolithiasis.  Ultrasound of the pelvis without adnexal torsion or abnormality. ____________________________________________   PROCEDURES    ____________________________________________   INITIAL IMPRESSION / ASSESSMENT AND PLAN / ED COURSE  Pertinent labs & imaging results that were available during my care of the patient were reviewed by me and considered in my medical decision making (see chart for details).  ----------------------------------------- 1130 PM on 07/26/2015 -----------------------------------------   Patient pain not relieved with Percocet. We'll re-dose Percocet.  ----------------------------------------- 2:19 PM on 07/26/2015 -----------------------------------------  Patient found to have reassuring ultrasound study however, with positive for Trichomonas on her wet prep. We'll treat for pelvic inflammatory disease. Counseled the patient about these findings. She understands that this is a sexually transmitted disease and that she will need to have further testing for other sexually transmitted diseases including HIV, syphilis and herpes. She understands that she must abstain from all sexual activity until cleared to resume by her OB/GYN which she plans to see in 2 weeks. She understands that she must also let her partner now and that he must also be tested and treated and abstain from sex until cleared to resume by a  provider.  ____________________________________________   FINAL CLINICAL IMPRESSION(S) / ED DIAGNOSES  Acute pelvic inflammatory disease. Initial visit.    Orbie Pyo, MD 07/26/15 [id number]

## 2015-07-26 NOTE — ED Notes (Signed)
Patient transported to CT 

## 2015-07-26 NOTE — ED Notes (Signed)
Pt states LLQ abd pain radiating to her groin and back, pt states she has had a lack of appetite and some vomiting, pt states she has urinary urgency but denies any blood in her urine

## 2015-07-26 NOTE — ED Notes (Signed)
Patient transported to Ultrasound 

## 2015-07-26 NOTE — ED Notes (Signed)
Pt reports LLQ pain and left flank pain x 1 wk

## 2016-02-09 ENCOUNTER — Emergency Department
Admission: EM | Admit: 2016-02-09 | Discharge: 2016-02-09 | Disposition: A | Discharge status: Home / Self Care | Payer: Medicaid Other | Attending: Emergency Medicine | Admitting: Emergency Medicine

## 2016-02-09 ENCOUNTER — Emergency Department: Payer: Medicaid Other

## 2016-02-09 ENCOUNTER — Encounter: Payer: Self-pay | Admitting: Emergency Medicine

## 2016-02-09 DIAGNOSIS — F17210 Nicotine dependence, cigarettes, uncomplicated: Secondary | ICD-10-CM | POA: Diagnosis not present

## 2016-02-09 DIAGNOSIS — Y9289 Other specified places as the place of occurrence of the external cause: Secondary | ICD-10-CM | POA: Insufficient documentation

## 2016-02-09 DIAGNOSIS — S4992XA Unspecified injury of left shoulder and upper arm, initial encounter: Secondary | ICD-10-CM | POA: Diagnosis present

## 2016-02-09 DIAGNOSIS — Z91040 Latex allergy status: Secondary | ICD-10-CM | POA: Diagnosis not present

## 2016-02-09 DIAGNOSIS — Z791 Long term (current) use of non-steroidal anti-inflammatories (NSAID): Secondary | ICD-10-CM | POA: Insufficient documentation

## 2016-02-09 DIAGNOSIS — W01198A Fall on same level from slipping, tripping and stumbling with subsequent striking against other object, initial encounter: Secondary | ICD-10-CM | POA: Insufficient documentation

## 2016-02-09 DIAGNOSIS — S46912A Strain of unspecified muscle, fascia and tendon at shoulder and upper arm level, left arm, initial encounter: Secondary | ICD-10-CM | POA: Diagnosis not present

## 2016-02-09 DIAGNOSIS — Y9389 Activity, other specified: Secondary | ICD-10-CM | POA: Insufficient documentation

## 2016-02-09 DIAGNOSIS — Y998 Other external cause status: Secondary | ICD-10-CM | POA: Diagnosis not present

## 2016-02-09 DIAGNOSIS — Z79899 Other long term (current) drug therapy: Secondary | ICD-10-CM | POA: Insufficient documentation

## 2016-02-09 MED ORDER — KETOROLAC TROMETHAMINE 30 MG/ML IJ SOLN
30.0000 mg | Freq: Once | INTRAMUSCULAR | Status: AC
  Administered 2016-02-09: 30 mg via INTRAVENOUS
  Filled 2016-02-09: qty 1

## 2016-02-09 MED ORDER — METHOCARBAMOL 500 MG PO TABS: 500.0000 mg | ORAL_TABLET | Freq: Four times a day (QID) | ORAL | Status: DC | PRN

## 2016-02-09 MED ORDER — DICLOFENAC SODIUM 75 MG PO TBEC: 75.0000 mg | DELAYED_RELEASE_TABLET | Freq: Two times a day (BID) | ORAL | Status: DC

## 2016-02-09 MED ORDER — DIAZEPAM 5 MG/ML IJ SOLN
5.0000 mg | Freq: Once | INTRAMUSCULAR | Status: AC
Start: 2016-02-09 — End: 2016-02-09
  Administered 2016-02-09: 5 mg via INTRAVENOUS
  Filled 2016-02-09: qty 2

## 2016-02-09 MED ORDER — HYDROCODONE-ACETAMINOPHEN 5-325 MG PO TABS: 1.0000 | ORAL_TABLET | ORAL | Status: DC | PRN

## 2016-02-09 NOTE — ED Provider Notes (Signed)
[facility] Emergency Department Provider Note  ____________________________________________  Time seen: Approximately 12:19 PM  I have reviewed the triage vital signs and the nursing notes.   HISTORY  Chief Complaint Fall    HPI Katherine Murray is a 35 y.o. female who presents for evaluation of left shoulder pain times one day. Patient states that she fell yesterday landing on her left shoulder and her arm bent backwards. Complains of pain radiating from the left lateral aspect of her neck down her left arm. Does have some numbness and tingling according to the patient. The pain as a 10 over 10 at this time. Worsened with movement of her shoulder relieved minimally with rest.   Past Medical History  Diagnosis Date  . A-fib ([place])   . Gallstones   . Kidney stones   . Pneumonia   . Diverticulosis   . Headache     There are no active problems to display for this patient.   Past Surgical History  Procedure Laterality Date  . Cholecystectomy    . Cesarean section    . Tonsillectomy    . Gallbladder surgery      Current Outpatient Rx  Name  Route  Sig  Dispense  Refill  . butalbital-acetaminophen-caffeine (FIORICET) 50-325-40 MG per tablet   Oral   Take 1-2 tablets by mouth every 6 (six) hours as needed for headache.   12 tablet   0   . diclofenac (VOLTAREN) 75 MG EC tablet   Oral   Take 1 tablet (75 mg total) by mouth 2 (two) times daily.   60 tablet   0   . dicyclomine (BENTYL) 20 MG tablet   Oral   Take 1 tablet (20 mg total) by mouth 2 (two) times daily.   20 tablet   0   . doxycycline (VIBRA-TABS) 100 MG tablet   Oral   Take 1 tablet (100 mg total) by mouth 2 (two) times daily.   28 tablet   0   . glucagon (GLUCAGON EMERGENCY) 1 MG injection   Intramuscular   Inject into the muscle.         Marland Kitchen HYDROcodone-acetaminophen (NORCO) 5-325 MG tablet   Oral   Take 1-2 tablets by mouth every 4 (four) hours as needed for moderate  pain.   15 tablet   0   . medroxyPROGESTERone (DEPO-PROVERA) 150 MG/ML injection   Intramuscular   Inject 150 mg into the muscle every 3 (three) months.         . methocarbamol (ROBAXIN) 500 MG tablet   Oral   Take 1 tablet (500 mg total) by mouth every 6 (six) hours as needed for muscle spasms.   30 tablet   0   . metoprolol (LOPRESSOR) 50 MG tablet   Oral   Take 50 mg by mouth 2 (two) times daily.         . ondansetron (ZOFRAN ODT) 4 MG disintegrating tablet   Oral   Take 1 tablet (4 mg total) by mouth every 8 (eight) hours as needed for nausea or vomiting.   20 tablet   0   . pantoprazole (PROTONIX) 40 MG tablet   Oral   Take 1 tablet (40 mg total) by mouth 2 (two) times daily before a meal.   60 tablet   3   . promethazine (PHENERGAN) 25 MG tablet   Oral   Take 1 tablet (25 mg total) by mouth every 6 (six) hours as needed for  nausea or vomiting.   30 tablet   0   . ranitidine (ZANTAC) 150 MG tablet   Oral   Take 1 tablet (150 mg total) by mouth 2 (two) times daily.   60 tablet   0   . sucralfate (CARAFATE) 1 GM/10ML suspension   Oral   Take 10 mLs (1 g total) by mouth 4 (four) times daily -  with meals and at bedtime.   420 mL   0     Allergies Lyrica; Eggs or egg-derived products; and Latex  Family History  Problem Relation Age of Onset  . Colon cancer Paternal Grandmother   . Ulcerative colitis    . Diabetes    . Kidney disease    . Stomach cancer Maternal Grandfather     Social History Social History  Substance Use Topics  . Smoking status: Current Some Day Smoker -- 0.50 packs/day    Types: Cigarettes  . Smokeless tobacco: Never Used     Comment: smokes "once or twice a week"  . Alcohol Use: No    Review of Systems Constitutional: No fever/chills Musculoskeletal:Positive for left shoulder pain Skin: Negative for rash. Neurological: Negative for headaches, focal weakness or numbness.  10-point ROS otherwise  negative.  ____________________________________________   PHYSICAL EXAM:  VITAL SIGNS: ED Triage Vitals  Enc Vitals Group     BP 02/09/16 1128 124/73 mmHg     Pulse Rate 02/09/16 1128 75     Resp 02/09/16 1128 20     Temp 02/09/16 1128 98.1 F (36.7 C)     Temp Source 02/09/16 1128 Oral     SpO2 02/09/16 1128 99 %     Weight 02/09/16 1128 180 lb (81.647 kg)     Height 02/09/16 1128 5\' 5"  (1.651 m)     Head Cir --      Peak Flow --      Pain Score 02/09/16 1128 10     Pain Loc --      Pain Edu? --      Excl. in [place]? --     Constitutional: Alert and oriented. Well appearing and in no acute distress. Musculoskeletal: Left shoulder point tenderness extreme limited range of motion increased pain with abduction and adduction and extension and flexion. Neurovascularly intact. No ecchymosis or bruising noted. Neurologic:  Normal speech and language. No gross focal neurologic deficits are appreciated. No gait instability. Skin:  Skin is warm, dry and intact. No rash noted. Psychiatric: Mood and affect are normal. Speech and behavior are normal.  ____________________________________________   LABS (all labs ordered are listed, but only abnormal results are displayed)  Labs Reviewed - No data to display ____________________________________________  RADIOLOGY  No acute osseous findings. ____________________________________________   PROCEDURES  Procedure(s) performed: None  Critical Care performed: No  ____________________________________________   INITIAL IMPRESSION / ASSESSMENT AND PLAN / ED COURSE  Pertinent labs & imaging results that were available during my care of the patient were reviewed by me and considered in my medical decision making (see chart for details).  Status post fall with acute left shoulder contusion/muscle strain. Given for diclofenac twice a day, Robaxin 500 mg 4 times a day the patient IS a return ER with any worsening  symptomology. ____________________________________________   FINAL CLINICAL IMPRESSION(S) / ED DIAGNOSES  Final diagnoses:  Shoulder strain, left, initial encounter     This chart was dictated using voice recognition software/Dragon. Despite best efforts to proofread, errors can occur which can change the  meaning. Any change was purely unintentional.   Arlyss Repress, PA-C 02/09/16 1631  Earleen Newport, MD 02/10/16 1101

## 2016-02-09 NOTE — ED Notes (Signed)
Pt to ed with c/o fall yesterday,  Pt states she was walking and fell hitting her left shoulder on bed post at home.  Pt states today increased pain with movement of left shoulder.

## 2016-02-09 NOTE — Discharge Instructions (Signed)
Muscle Strain A muscle strain is an injury that occurs when a muscle is stretched beyond its normal length. Usually a small number of muscle fibers are torn when this happens. Muscle strain is rated in degrees. First-degree strains have the least amount of muscle fiber tearing and pain. Second-degree and third-degree strains have increasingly more tearing and pain.  Usually, recovery from muscle strain takes 1-2 weeks. Complete healing takes 5-6 weeks.  CAUSES  Muscle strain happens when a sudden, violent force placed on a muscle stretches it too far. This may occur with lifting, sports, or a fall.  RISK FACTORS Muscle strain is especially common in athletes.  SIGNS AND SYMPTOMS At the site of the muscle strain, there may be:  Pain.  Bruising.  Swelling.  Difficulty using the muscle due to pain or lack of normal function. DIAGNOSIS  Your health care provider will perform a physical exam and ask about your medical history. TREATMENT  Often, the best treatment for a muscle strain is resting, icing, and applying cold compresses to the injured area.  HOME CARE INSTRUCTIONS   Use the PRICE method of treatment to promote muscle healing during the first 2-3 days after your injury. The PRICE method involves:  Protecting the muscle from being injured again.  Restricting your activity and resting the injured body part.  Icing your injury. To do this, put ice in a plastic bag. Place a towel between your skin and the bag. Then, apply the ice and leave it on from 15-20 minutes each hour. After the third day, switch to moist heat packs.  Apply compression to the injured area with a splint or elastic bandage. Be careful not to wrap it too tightly. This may interfere with blood circulation or increase swelling.  Elevate the injured body part above the level of your heart as often as you can.  Only take over-the-counter or prescription medicines for pain, discomfort, or fever as directed by your  health care provider.  Warming up prior to exercise helps to prevent future muscle strains. SEEK MEDICAL CARE IF:   You have increasing pain or swelling in the injured area.  You have numbness, tingling, or a significant loss of strength in the injured area. MAKE SURE YOU:   Understand these instructions.  Will watch your condition.  Will get help right away if you are not doing well or get worse.   This information is not intended to replace advice given to you by your health care provider. Make sure you discuss any questions you have with your health care provider.   Document Released: 11/05/2005 Document Revised: 08/26/2013 Document Reviewed: 06/04/2013 Elsevier Interactive Patient Education 2016 [place].  Cryotherapy Cryotherapy means treatment with cold. Ice or gel packs can be used to reduce both pain and swelling. Ice is the most helpful within the first 24 to 48 hours after an injury or flare-up from overusing a muscle or joint. Sprains, strains, spasms, burning pain, shooting pain, and aches can all be eased with ice. Ice can also be used when recovering from surgery. Ice is effective, has very few side effects, and is safe for most people to use. PRECAUTIONS  Ice is not a safe treatment option for people with:  Raynaud phenomenon. This is a condition affecting small blood vessels in the extremities. Exposure to cold may cause your problems to return.  Cold hypersensitivity. There are many forms of cold hypersensitivity, including:  Cold urticaria. Red, itchy hives appear on the skin when the  tissues begin to warm after being iced.  Cold erythema. This is a red, itchy rash caused by exposure to cold.  Cold hemoglobinuria. Red blood cells break down when the tissues begin to warm after being iced. The hemoglobin that carry oxygen are passed into the urine because they cannot combine with blood proteins fast enough.  Numbness or altered sensitivity in the area being  iced. If you have any of the following conditions, do not use ice until you have discussed cryotherapy with your caregiver:  Heart conditions, such as arrhythmia, angina, or chronic heart disease.  High blood pressure.  Healing wounds or open skin in the area being iced.  Current infections.  Rheumatoid arthritis.  Poor circulation.  Diabetes. Ice slows the blood flow in the region it is applied. This is beneficial when trying to stop inflamed tissues from spreading irritating chemicals to surrounding tissues. However, if you expose your skin to cold temperatures for too long or without the proper protection, you can damage your skin or nerves. Watch for signs of skin damage due to cold. HOME CARE INSTRUCTIONS Follow these tips to use ice and cold packs safely.  Place a dry or damp towel between the ice and skin. A damp towel will cool the skin more quickly, so you may need to shorten the time that the ice is used.  For a more rapid response, add gentle compression to the ice.  Ice for no more than 10 to 20 minutes at a time. The bonier the area you are icing, the less time it will take to get the benefits of ice.  Check your skin after 5 minutes to make sure there are no signs of a poor response to cold or skin damage.  Rest 20 minutes or more between uses.  Once your skin is numb, you can end your treatment. You can test numbness by very lightly touching your skin. The touch should be so light that you do not see the skin dimple from the pressure of your fingertip. When using ice, most people will feel these normal sensations in this order: cold, burning, aching, and numbness.  Do not use ice on someone who cannot communicate their responses to pain, such as small children or people with dementia. HOW TO MAKE AN ICE PACK Ice packs are the most common way to use ice therapy. Other methods include ice massage, ice baths, and cryosprays. Muscle creams that cause a cold, tingly  feeling do not offer the same benefits that ice offers and should not be used as a substitute unless recommended by your caregiver. To make an ice pack, do one of the following:  Place crushed ice or a bag of frozen vegetables in a sealable plastic bag. Squeeze out the excess air. Place this bag inside another plastic bag. Slide the bag into a pillowcase or place a damp towel between your skin and the bag.  Mix 3 parts water with 1 part rubbing alcohol. Freeze the mixture in a sealable plastic bag. When you remove the mixture from the freezer, it will be slushy. Squeeze out the excess air. Place this bag inside another plastic bag. Slide the bag into a pillowcase or place a damp towel between your skin and the bag. SEEK MEDICAL CARE IF:  You develop white spots on your skin. This may give the skin a blotchy (mottled) appearance.  Your skin turns blue or pale.  Your skin becomes waxy or hard.  Your swelling gets worse. MAKE SURE  YOU:   Understand these instructions.  Will watch your condition.  Will get help right away if you are not doing well or get worse.   This information is not intended to replace advice given to you by your health care provider. Make sure you discuss any questions you have with your health care provider.   Document Released: 07/02/2011 Document Revised: 11/26/2014 Document Reviewed: 07/02/2011 Elsevier Interactive Patient Education [employer].

## 2016-02-09 NOTE — ED Notes (Signed)
States she fell yesterday   Landed on left side  Having pain to left post shoulder area   Min ROM noted d/t pain  Positive pulses and senstation

## 2016-04-19 ENCOUNTER — Encounter: Payer: Self-pay | Admitting: Emergency Medicine

## 2016-04-19 ENCOUNTER — Emergency Department: Payer: Medicaid Other

## 2016-04-19 ENCOUNTER — Emergency Department
Admission: EM | Admit: 2016-04-19 | Discharge: 2016-04-20 | Disposition: A | Discharge status: Home / Self Care | Payer: Medicaid Other | Attending: Emergency Medicine | Admitting: Emergency Medicine

## 2016-04-19 DIAGNOSIS — Z79899 Other long term (current) drug therapy: Secondary | ICD-10-CM | POA: Insufficient documentation

## 2016-04-19 DIAGNOSIS — Z792 Long term (current) use of antibiotics: Secondary | ICD-10-CM | POA: Insufficient documentation

## 2016-04-19 DIAGNOSIS — F17210 Nicotine dependence, cigarettes, uncomplicated: Secondary | ICD-10-CM | POA: Diagnosis not present

## 2016-04-19 DIAGNOSIS — I4891 Unspecified atrial fibrillation: Secondary | ICD-10-CM | POA: Diagnosis not present

## 2016-04-19 DIAGNOSIS — R109 Unspecified abdominal pain: Secondary | ICD-10-CM

## 2016-04-19 DIAGNOSIS — Z791 Long term (current) use of non-steroidal anti-inflammatories (NSAID): Secondary | ICD-10-CM | POA: Diagnosis not present

## 2016-04-19 LAB — COMPREHENSIVE METABOLIC PANEL
ALBUMIN: 4.4 g/dL (ref 3.5–5.0)
ALT: 32 U/L (ref 14–54)
AST: 32 U/L (ref 15–41)
Alkaline Phosphatase: 42 U/L (ref 38–126)
Anion gap: 5 (ref 5–15)
BILIRUBIN TOTAL: 0.3 mg/dL (ref 0.3–1.2)
BUN: 10 mg/dL (ref 6–20)
CO2: 28 mmol/L (ref 22–32)
CREATININE: 0.68 mg/dL (ref 0.44–1.00)
Calcium: 9.5 mg/dL (ref 8.9–10.3)
Chloride: 105 mmol/L (ref 101–111)
GFR calc Af Amer: >60 mL/min (ref >60)
GLUCOSE: 96 mg/dL (ref 65–99)
Potassium: 3.8 mmol/L (ref 3.5–5.1)
Sodium: 138 mmol/L (ref 135–145)
TOTAL PROTEIN: 7 g/dL (ref 6.5–8.1)

## 2016-04-19 LAB — URINALYSIS COMPLETE WITH MICROSCOPIC (ARMC ONLY)
BACTERIA UA: NONE SEEN
BILIRUBIN URINE: NEGATIVE
GLUCOSE, UA: NEGATIVE mg/dL
KETONES UR: NEGATIVE mg/dL
LEUKOCYTES UA: NEGATIVE
NITRITE: NEGATIVE
PH: 6 (ref 5.0–8.0)
Protein, ur: NEGATIVE mg/dL
Specific Gravity, Urine: 1.005 (ref 1.005–1.030)

## 2016-04-19 LAB — CBC
HEMATOCRIT: 37.1 % (ref 35.0–47.0)
Hemoglobin: 12.9 g/dL (ref 12.0–16.0)
MCH: 32.1 pg (ref 26.0–34.0)
MCHC: 34.6 g/dL (ref 32.0–36.0)
MCV: 92.8 fL (ref 80.0–100.0)
PLATELETS: 241 10*3/uL (ref 150–440)
RBC: 4 MIL/uL (ref 3.80–5.20)
RDW: 13.5 % (ref 11.5–14.5)
WBC: 7.3 10*3/uL (ref 3.6–11.0)

## 2016-04-19 LAB — POCT PREGNANCY, URINE: Preg Test, Ur: NEGATIVE

## 2016-04-19 LAB — LIPASE, BLOOD: Lipase: 34 U/L (ref 11–51)

## 2016-04-19 MED ORDER — IOPAMIDOL (ISOVUE-300) INJECTION 61%
100.0000 mL | Freq: Once | INTRAVENOUS | Status: AC | PRN
  Administered 2016-04-19: 100 mL via INTRAVENOUS

## 2016-04-19 MED ORDER — ONDANSETRON 8 MG PO TBDP: 8.0000 mg | ORAL_TABLET | Freq: Three times a day (TID) | ORAL | Status: DC | PRN

## 2016-04-19 MED ORDER — ONDANSETRON HCL 4 MG/2ML IJ SOLN
4.0000 mg | Freq: Once | INTRAMUSCULAR | Status: AC
  Administered 2016-04-19: 4 mg via INTRAVENOUS
  Filled 2016-04-19: qty 2

## 2016-04-19 MED ORDER — HYDROMORPHONE HCL 1 MG/ML IJ SOLN
1.0000 mg | Freq: Once | INTRAMUSCULAR | Status: AC
  Administered 2016-04-19: 1 mg via INTRAVENOUS
  Filled 2016-04-19: qty 1

## 2016-04-19 MED ORDER — SODIUM CHLORIDE 0.9 % IV BOLUS (SEPSIS)
1000.0000 mL | Freq: Once | INTRAVENOUS | Status: AC
  Administered 2016-04-19: 1000 mL via INTRAVENOUS

## 2016-04-19 MED ORDER — DIATRIZOATE MEGLUMINE & SODIUM 66-10 % PO SOLN
15.0000 mL | Freq: Once | ORAL | Status: AC
  Administered 2016-04-19: 15 mL via ORAL

## 2016-04-19 MED ORDER — OXYCODONE-ACETAMINOPHEN 5-325 MG PO TABS: 1.0000 | ORAL_TABLET | Freq: Four times a day (QID) | ORAL | Status: DC | PRN

## 2016-04-19 NOTE — Discharge Instructions (Signed)
You were prescribed a medication that is potentially sedating. Do not drink alcohol, drive or participate in any other potentially dangerous activities while taking this medication as it may make you sleepy. Do not take this medication with any other sedating medications, either prescription or over-the-counter. If you were prescribed Percocet or Vicodin, do not take these with acetaminophen (Tylenol) as it is already contained within these medications. °  °Opioid pain medications (or "narcotics") can be habit forming.  Use it as little as possible to achieve adequate pain control.  Do not use or use it with extreme caution if you have a history of opiate abuse or dependence.  If you are on a pain contract with your primary care doctor or a pain specialist, be sure to let them know you were prescribed this medication today from the [facility].  This medication is intended for your use only - do not give any to anyone else and keep it in a secure place where nobody else, especially children and pets, have access to it.  It will also cause or worsen constipation, so you may want to consider taking an over-the-counter stool softener while you are taking this medication. ° °Abdominal Pain, Adult °Many things can cause abdominal pain. Usually, abdominal pain is not caused by a disease and will improve without treatment. It can often be observed and treated at home. Your health care provider will do a physical exam and possibly order blood tests and X-rays to help determine the seriousness of your pain. However, in many cases, more time must pass before a clear cause of the pain can be found. Before that point, your health care provider may not know if you need more testing or further treatment. °HOME CARE INSTRUCTIONS °Monitor your abdominal pain for any changes. The following actions may help to alleviate any discomfort you are experiencing: °· Only take over-the-counter or prescription  medicines as directed by your health care provider. °· Do not take laxatives unless directed to do so by your health care provider. °· Try a clear liquid diet (broth, tea, or water) as directed by your health care provider. Slowly move to a bland diet as tolerated. °SEEK MEDICAL CARE IF: °· You have unexplained abdominal pain. °· You have abdominal pain associated with nausea or diarrhea. °· You have pain when you urinate or have a bowel movement. °· You experience abdominal pain that wakes you in the night. °· You have abdominal pain that is worsened or improved by eating food. °· You have abdominal pain that is worsened with eating fatty foods. °· You have a fever. °SEEK IMMEDIATE MEDICAL CARE IF: °· Your pain does not go away within 2 hours. °· You keep throwing up (vomiting). °· Your pain is felt only in portions of the abdomen, such as the right side or the left lower portion of the abdomen. °· You pass bloody or black tarry stools. °MAKE SURE YOU: °· Understand these instructions. °· Will watch your condition. °· Will get help right away if you are not doing well or get worse. °  °This information is not intended to replace advice given to you by your health care provider. Make sure you discuss any questions you have with your health care provider. °  °Document Released: 08/15/2005 Document Revised: 07/27/2015 Document Reviewed: 07/15/2013 °Elsevier Interactive Patient Education ©2016 Elsevier Inc. ° °

## 2016-04-19 NOTE — ED Notes (Addendum)
Pt presents to ED with c/o ride sided abdominal pain with nausea/vomiting/diarrhea, onset 2 days. Pt reports noticed pin/bloody urine.

## 2016-04-19 NOTE — ED Provider Notes (Signed)
[facility] Emergency Department Provider Note  ____________________________________________  Time seen: 8:30 PM  I have reviewed the triage vital signs and the nursing notes.   HISTORY  Chief Complaint Abdominal Pain    HPI Katherine Murray is a 35 y.o. female who complains of right-sided abdominal pain with nausea vomiting diarrhea for the past 2 days. She saw her primary care doctor who did an ultrasound in the clinic and told her she had some sort of a stone and wanted her to get an echo on Wednesday and then see a Psychologist, sport and exercise. No change in bowel habits, no hematemesis. No fever. Abdominal pain is severe, colicky. Worse with eating. No alleviating factors. Nonradiating.     Past Medical History  Diagnosis Date  . A-fib ([place])   . Gallstones   . Kidney stones   . Pneumonia   . Diverticulosis   . Headache      There are no active problems to display for this patient.    Past Surgical History  Procedure Laterality Date  . Cholecystectomy    . Cesarean section    . Tonsillectomy    . Gallbladder surgery       Current Outpatient Rx  Name  Route  Sig  Dispense  Refill  . butalbital-acetaminophen-caffeine (FIORICET) 50-325-40 MG per tablet   Oral   Take 1-2 tablets by mouth every 6 (six) hours as needed for headache.   12 tablet   0   . diclofenac (VOLTAREN) 75 MG EC tablet   Oral   Take 1 tablet (75 mg total) by mouth 2 (two) times daily.   60 tablet   0   . dicyclomine (BENTYL) 20 MG tablet   Oral   Take 1 tablet (20 mg total) by mouth 2 (two) times daily.   20 tablet   0   . doxycycline (VIBRA-TABS) 100 MG tablet   Oral   Take 1 tablet (100 mg total) by mouth 2 (two) times daily.   28 tablet   0   . glucagon (GLUCAGON EMERGENCY) 1 MG injection   Intramuscular   Inject into the muscle.         Marland Kitchen HYDROcodone-acetaminophen (NORCO) 5-325 MG tablet   Oral   Take 1-2 tablets by mouth every 4 (four) hours as needed for moderate  pain.   15 tablet   0   . medroxyPROGESTERone (DEPO-PROVERA) 150 MG/ML injection   Intramuscular   Inject 150 mg into the muscle every 3 (three) months.         . methocarbamol (ROBAXIN) 500 MG tablet   Oral   Take 1 tablet (500 mg total) by mouth every 6 (six) hours as needed for muscle spasms.   30 tablet   0   . metoprolol (LOPRESSOR) 50 MG tablet   Oral   Take 50 mg by mouth 2 (two) times daily.         . ondansetron (ZOFRAN ODT) 8 MG disintegrating tablet   Oral   Take 1 tablet (8 mg total) by mouth every 8 (eight) hours as needed for nausea or vomiting.   20 tablet   0   . oxyCODONE-acetaminophen (ROXICET) 5-325 MG tablet   Oral   Take 1 tablet by mouth every 6 (six) hours as needed for severe pain.   10 tablet   0   . pantoprazole (PROTONIX) 40 MG tablet   Oral   Take 1 tablet (40 mg total) by mouth 2 (two) times  daily before a meal.   60 tablet   3   . promethazine (PHENERGAN) 25 MG tablet   Oral   Take 1 tablet (25 mg total) by mouth every 6 (six) hours as needed for nausea or vomiting.   30 tablet   0   . ranitidine (ZANTAC) 150 MG tablet   Oral   Take 1 tablet (150 mg total) by mouth 2 (two) times daily.   60 tablet   0   . sucralfate (CARAFATE) 1 GM/10ML suspension   Oral   Take 10 mLs (1 g total) by mouth 4 (four) times daily -  with meals and at bedtime.   420 mL   0      Allergies Lyrica; Eggs or egg-derived products; and Latex   Family History  Problem Relation Age of Onset  . Colon cancer Paternal Grandmother   . Ulcerative colitis    . Diabetes    . Kidney disease    . Stomach cancer Maternal Grandfather     Social History Social History  Substance Use Topics  . Smoking status: Current Some Day Smoker -- 0.50 packs/day    Types: Cigarettes  . Smokeless tobacco: Never Used     Comment: smokes "once or twice a week"  . Alcohol Use: No    Review of Systems  Constitutional:   No fever or chills.  Eyes:   No vision  changes.  ENT:   No sore throat. No rhinorrhea. Cardiovascular:   No chest pain. Respiratory:   No dyspnea or cough. Gastrointestinal:   Positive abdominal pain with vomiting and diarrhea.  Genitourinary:   Negative for dysuria or difficulty urinating. Musculoskeletal:   Negative for focal pain or swelling Neurological:   Negative for headaches 10-point ROS otherwise negative.  ____________________________________________   PHYSICAL EXAM:  VITAL SIGNS: ED Triage Vitals  Enc Vitals Group     BP 04/19/16 2012 119/79 mmHg     Pulse Rate 04/19/16 2012 98     Resp 04/19/16 2012 18     Temp 04/19/16 2012 98.1 F (36.7 C)     Temp Source 04/19/16 2012 Oral     SpO2 04/19/16 2012 100 %     Weight 04/19/16 2012 186 lb (84.369 kg)     Height 04/19/16 2012 5\' 5"  (1.651 m)     Head Cir --      Peak Flow --      Pain Score 04/19/16 2013 10     Pain Loc --      Pain Edu? --      Excl. in [place]? --     Vital signs reviewed, nursing assessments reviewed.   Constitutional:   Alert and oriented. Well appearing and in no distress. Eyes:   No scleral icterus. No conjunctival pallor. PERRL. EOMI.  No nystagmus. ENT   Head:   Normocephalic and atraumatic.   Nose:   No congestion/rhinnorhea. No septal hematoma   Mouth/Throat:   MMM, no pharyngeal erythema. No peritonsillar mass.    Neck:   No stridor. No SubQ emphysema. No meningismus. Hematological/Lymphatic/Immunilogical:   No cervical lymphadenopathy. Cardiovascular:   RRR. Symmetric bilateral radial and DP pulses.  No murmurs.  Respiratory:   Normal respiratory effort without tachypnea nor retractions. Breath sounds are clear and equal bilaterally. No wheezes/rales/rhonchi. Gastrointestinal:   Soft With diffuse right-sided tenderness in the right upper mid and lower quadrants.. Non distended. There is no CVA tenderness.  No rebound or rigidity. Positive guarding. Positive  Rovsing sign. Positive obturator sign. Negative heel  tap. Genitourinary:   deferred Musculoskeletal:   Nontender with normal range of motion in all extremities. No joint effusions.  No lower extremity tenderness.  No edema. Neurologic:   Normal speech and language.  CN 2-10 normal. Motor grossly intact. No gross focal neurologic deficits are appreciated.  Skin:    Skin is warm, dry and intact. No rash noted.  No petechiae, purpura, or bullae.  ____________________________________________    LABS (pertinent positives/negatives) (all labs ordered are listed, but only abnormal results are displayed) Labs Reviewed  URINALYSIS COMPLETEWITH MICROSCOPIC ([place]) - Abnormal; Notable for the following:    Color, Urine STRAW (*)    APPearance CLEAR (*)    Hgb urine dipstick 2+ (*)    Squamous Epithelial / LPF 0-5 (*)    All other components within normal limits  LIPASE, BLOOD  COMPREHENSIVE METABOLIC PANEL  CBC  POC URINE PREG, ED  POCT PREGNANCY, URINE   ____________________________________________   EKG    ____________________________________________    RADIOLOGY  CT abdomen pelvis negative  ____________________________________________   PROCEDURES   ____________________________________________   INITIAL IMPRESSION / ASSESSMENT AND PLAN / ED COURSE  Pertinent labs & imaging results that were available during my care of the patient were reviewed by me and considered in my medical decision making (see chart for details).  Gen. well-appearing no acute distress. No vomiting in the emergency department. Vital signs normal. Labs negative, CT negative. A serious abdominal pain of unknown cause. She is tolerating oral intake and well-appearing. Vital signs stable. We'll discharge home, symptom relief with Percocet and Zofran, follow up with primary care. Continue outpatient management.     ____________________________________________   FINAL CLINICAL IMPRESSION(S) / ED DIAGNOSES  Final diagnoses:  Right-sided  abdominal pain of unknown cause       Portions of this note were generated with dragon dictation software. Dictation errors may occur despite best attempts at proofreading.   Selia Mew, MD 04/19/16 2340

## 2016-08-01 ENCOUNTER — Telehealth: Payer: Self-pay | Admitting: Family Medicine

## 2016-08-01 ENCOUNTER — Encounter: Payer: Self-pay | Admitting: Emergency Medicine

## 2016-08-01 ENCOUNTER — Encounter: Payer: Self-pay | Admitting: Primary Care

## 2016-08-01 ENCOUNTER — Emergency Department
Admission: EM | Admit: 2016-08-01 | Discharge: 2016-08-01 | Disposition: A | Discharge status: Home / Self Care | Payer: 59 | Attending: Emergency Medicine | Admitting: Emergency Medicine

## 2016-08-01 ENCOUNTER — Ambulatory Visit (INDEPENDENT_AMBULATORY_CARE_PROVIDER_SITE_OTHER): Payer: 59 | Admitting: Primary Care

## 2016-08-01 VITALS — BP 102/70 | HR 109 | Temp 98.2°F | Ht 65.0 in | Wt 204.8 lb

## 2016-08-01 DIAGNOSIS — R000 Tachycardia, unspecified: Secondary | ICD-10-CM | POA: Insufficient documentation

## 2016-08-01 DIAGNOSIS — L989 Disorder of the skin and subcutaneous tissue, unspecified: Secondary | ICD-10-CM | POA: Insufficient documentation

## 2016-08-01 DIAGNOSIS — Z3042 Encounter for surveillance of injectable contraceptive: Secondary | ICD-10-CM | POA: Insufficient documentation

## 2016-08-01 DIAGNOSIS — R112 Nausea with vomiting, unspecified: Secondary | ICD-10-CM | POA: Diagnosis not present

## 2016-08-01 DIAGNOSIS — R1013 Epigastric pain: Secondary | ICD-10-CM | POA: Diagnosis not present

## 2016-08-01 DIAGNOSIS — R1011 Right upper quadrant pain: Secondary | ICD-10-CM | POA: Diagnosis not present

## 2016-08-01 DIAGNOSIS — G43001 Migraine without aura, not intractable, with status migrainosus: Secondary | ICD-10-CM | POA: Diagnosis not present

## 2016-08-01 DIAGNOSIS — R197 Diarrhea, unspecified: Secondary | ICD-10-CM | POA: Insufficient documentation

## 2016-08-01 DIAGNOSIS — F17210 Nicotine dependence, cigarettes, uncomplicated: Secondary | ICD-10-CM | POA: Insufficient documentation

## 2016-08-01 DIAGNOSIS — G43909 Migraine, unspecified, not intractable, without status migrainosus: Secondary | ICD-10-CM | POA: Insufficient documentation

## 2016-08-01 DIAGNOSIS — R1010 Upper abdominal pain, unspecified: Secondary | ICD-10-CM

## 2016-08-01 DIAGNOSIS — Z79899 Other long term (current) drug therapy: Secondary | ICD-10-CM | POA: Insufficient documentation

## 2016-08-01 DIAGNOSIS — R1012 Left upper quadrant pain: Secondary | ICD-10-CM | POA: Diagnosis not present

## 2016-08-01 HISTORY — DX: Tachycardia, unspecified: R00.0

## 2016-08-01 LAB — COMPREHENSIVE METABOLIC PANEL
ALK PHOS: 37 U/L — AB (ref 38–126)
ALT: 52 U/L (ref 14–54)
AST: 38 U/L (ref 15–41)
Albumin: 4.7 g/dL (ref 3.5–5.0)
Anion gap: 5 (ref 5–15)
BUN: 11 mg/dL (ref 6–20)
CALCIUM: 9.6 mg/dL (ref 8.9–10.3)
CHLORIDE: 108 mmol/L (ref 101–111)
CO2: 26 mmol/L (ref 22–32)
CREATININE: 0.83 mg/dL (ref 0.44–1.00)
GFR calc Af Amer: >60 mL/min (ref >60)
Glucose, Bld: 90 mg/dL (ref 65–99)
Potassium: 3.4 mmol/L — ABNORMAL LOW (ref 3.5–5.1)
Sodium: 139 mmol/L (ref 135–145)
Total Bilirubin: 0.3 mg/dL (ref 0.3–1.2)
Total Protein: 7.5 g/dL (ref 6.5–8.1)

## 2016-08-01 LAB — CBC
HCT: 41.9 % (ref 35.0–47.0)
Hemoglobin: 14.8 g/dL (ref 12.0–16.0)
MCH: 33.3 pg (ref 26.0–34.0)
MCHC: 35.3 g/dL (ref 32.0–36.0)
MCV: 94.4 fL (ref 80.0–100.0)
PLATELETS: 288 10*3/uL (ref 150–440)
RBC: 4.44 MIL/uL (ref 3.80–5.20)
RDW: 13.1 % (ref 11.5–14.5)
WBC: 7.1 10*3/uL (ref 3.6–11.0)

## 2016-08-01 LAB — LIPASE, BLOOD: LIPASE: 30 U/L (ref 11–51)

## 2016-08-01 MED ORDER — CICLOPIROX OLAMINE 0.77 % EX CREA: TOPICAL_CREAM | Freq: Two times a day (BID) | CUTANEOUS | 0 refills | Status: DC

## 2016-08-01 MED ORDER — ONDANSETRON 4 MG PO TBDP
ORAL_TABLET | ORAL | Status: AC
  Filled 2016-08-01: qty 1

## 2016-08-01 MED ORDER — MEDROXYPROGESTERONE ACETATE 150 MG/ML IM SUSY
PREFILLED_SYRINGE | Freq: Once | INTRAMUSCULAR | Status: AC
  Administered 2016-08-01: 16:00:00 via INTRAMUSCULAR

## 2016-08-01 MED ORDER — MEDROXYPROGESTERONE ACETATE 150 MG/ML IM SUSP: 150.0000 mg | Freq: Once | INTRAMUSCULAR | Status: DC

## 2016-08-01 MED ORDER — ONDANSETRON HCL 4 MG PO TABS
4.0000 mg | ORAL_TABLET | Freq: Once | ORAL | Status: DC
  Filled 2016-08-01: qty 1

## 2016-08-01 MED ORDER — GI COCKTAIL ~~LOC~~: 30.0000 mL | Freq: Once | ORAL | Status: DC

## 2016-08-01 MED ORDER — ONDANSETRON HCL 4 MG PO TABS
ORAL_TABLET | ORAL | Status: AC
  Filled 2016-08-01: qty 1

## 2016-08-01 MED ORDER — METOPROLOL TARTRATE 50 MG PO TABS
50.0000 mg | ORAL_TABLET | Freq: Two times a day (BID) | ORAL | 3 refills | Status: DC
Start: 2016-08-01 — End: 2016-11-22

## 2016-08-01 MED ORDER — ONDANSETRON 4 MG PO TBDP
4.0000 mg | ORAL_TABLET | Freq: Once | ORAL | Status: AC | PRN
  Administered 2016-08-01: 4 mg via ORAL

## 2016-08-01 MED ORDER — GI COCKTAIL ~~LOC~~
ORAL | Status: AC
  Filled 2016-08-01: qty 30

## 2016-08-01 NOTE — ED Triage Notes (Signed)
Pt reports RUQ pain with N/V that radiates into her back. Pt went to PCP and was told to come get evaluated for pancreatitis.

## 2016-08-01 NOTE — ED Notes (Signed)
When this RN went to take pt medications that are ordered, pt is not in room. This RN checked bathrooms close by, pt not in bathrooms. Dr. Clearnce Hasten notifed, will wait to see if pt comes back to room.

## 2016-08-01 NOTE — Patient Instructions (Signed)
You received a Depo Medrol Injection today. Schedule a nurse visit for subsequent injections.  I sent refills of Metoprolol to your pharmacy.   You will be contacted regarding your referral to Dermatology.  Please let us know if you have not heard back within one week.   Apply the Cicloprox cream twice daily to affected area for the next 2 weeks.   Please schedule a physical with me within the next 3-6 months. You may also schedule a lab only appointment 3-4 days prior. We will discuss your lab results in detail during your physical.  It was a pleasure to meet you today! Please don't hesitate to call me with any questions. Welcome to [facility]!

## 2016-08-01 NOTE — ED Notes (Signed)
Pt states RUQ abdominal pain going around to back since yesterday morning. States N&V since then too. States when she drinks/eats she throws up 15 minutes later. Has had gallbladder removed. No hx of kidney stones.

## 2016-08-01 NOTE — Assessment & Plan Note (Signed)
Managed on Depo-Provera injections since 2012. Urine pregnancy test negative today. Injection provided today.

## 2016-08-01 NOTE — ED Notes (Signed)
No body or belongings in room.

## 2016-08-01 NOTE — ED Notes (Signed)
Pt still not in room, no personal belongings found.

## 2016-08-01 NOTE — Assessment & Plan Note (Signed)
Diagnosed numerous years ago and is managed on Lopressor 50 mg twice a day. Endorses normal cardiac workup in the past. Refill provided today.

## 2016-08-01 NOTE — Progress Notes (Signed)
Subjective:    Patient ID: Katherine Murray, female    DOB: [birth year], 35 y.o.   MRN: [id number]  HPI  Katherine Murray is a 35 year old female who presents today to establish care and discuss the problems mentioned below. Will obtain old records.  1) Skin Leison: Located to the posterior right third digit (anatomical position) that has been present for the past 2 months. Over the past several weeks she's noticed an increase in size, swelling, and discomfort to the site. Denies recent injury or trauma, itching. She's taking advil and applied Aveno cream without improvement. She is a current smoker and smokes 1 cigarette daily. Prior smoker of 10 years. She's also noticed faitgue, decrease in appetite. History of melanoma x 4.   2) Surveillance of Depo Provera Injections: Has been receiving Depo-Provera injections since 2012. Her last injection was slightly over 3 months ago. She does experience menstrual spotting with Depo injection occasionally. She is requesting renewal of Depo-Provera injections.  3) Migraines: Long history of migranes for the past 8 years. She currently follows with Neurology at [facility] and follows every 6 months to 1 year. She is managed on  Fioricet as needed for which she takes once weekly on average. She will start with Advil gels when she expresses headaches and then we'll proceed to Fioricet if needed. Recent CT head per neurology unremarkable. Overall she feels well managed.  4) Tachycardia: Currently managed on metoprolol 50 mg BID for which she has been taking for the past several years. Without her medication she will experience tachycardia with palpitations. She endorses normal cardiac workup in the past. She is needing a refill of her medications today.  Review of Systems  Constitutional: Negative for fever and unexpected weight change.  Respiratory: Negative for cough.   Cardiovascular: Negative for chest pain and palpitations.  Genitourinary: Negative for menstrual  problem.  Skin: Positive for color change and rash. Negative for wound.  Neurological:       Headaches well managed.       Past Medical History:  Diagnosis Date  . A-fib ([place])   . Diverticulosis   . Gallstones   . Headache   . Kidney stones   . Pneumonia      Social History   Social History  . Marital status: Single    Spouse name: N/A  . Number of children: N/A  . Years of education: N/A   Occupational History  . therapist [employer]   Social History Main Topics  . Smoking status: Current Some Day Smoker    Packs/day: 0.50    Types: Cigarettes  . Smokeless tobacco: Never Used     Comment: smokes "once or twice a week"  . Alcohol use No  . Drug use: No  . Sexual activity: Yes    Birth control/ protection: Injection   Other Topics Concern  . Not on file   Social History Narrative  . No narrative on file    Past Surgical History:  Procedure Laterality Date  . CESAREAN SECTION    . CHOLECYSTECTOMY    . GALLBLADDER SURGERY    . TONSILLECTOMY      Family History  Problem Relation Age of Onset  . Colon cancer Paternal Grandmother   . Ulcerative colitis    . Diabetes    . Kidney disease    . Stomach cancer Maternal Grandfather     Allergies  Allergen Reactions  . Lyrica [Pregabalin] Swelling  .  Eggs Or Egg-Derived Products Rash and Swelling  . Latex Rash    No current outpatient prescriptions on file prior to visit.   No current facility-administered medications on file prior to visit.     BP 102/70   Pulse (!) 109   Temp 98.2 F (36.8 C) (Oral)   Ht 5\' 5"  (1.651 m)   Wt 204 lb 12.8 oz (92.9 kg)   SpO2 98%   BMI 34.08 kg/m    Objective:   Physical Exam  Constitutional: She appears well-nourished.  Neck: Neck supple.  Cardiovascular: Normal rate and regular rhythm.   Pulmonary/Chest: Effort normal and breath sounds normal.  Skin: Skin is warm and dry.  1 cm circumferential lesion located to base of right third digit  posteriorly (anatomical position). Mild erythema, tenderness, no open wound.  Psychiatric: She has a normal mood and affect.          Assessment & Plan:

## 2016-08-01 NOTE — Assessment & Plan Note (Signed)
Odd lesion located to right third digit. Appears to be ringworm but cannot exclude malignancy. Will treat with Ciclopirox cream twice daily for several weeks; referral to dermatology placed for further evaluation given history of melanoma.

## 2016-08-01 NOTE — Progress Notes (Signed)
Pre visit review using our clinic review tool, if applicable. No additional management support is needed unless otherwise documented below in the visit note. 

## 2016-08-01 NOTE — Telephone Encounter (Signed)
Pt scheduled a cpx 08/21/16 she wants to get her labs done @ [facility].  Need orders in system

## 2016-08-01 NOTE — ED Provider Notes (Signed)
[facility] Emergency Department Provider Note   ____________________________________________   First MD Initiated Contact with Patient 08/01/16 1953     (approximate)  I have reviewed the triage vital signs and the nursing notes.   HISTORY  Chief Complaint Abdominal Pain   HPI Katherine Murray is a 35 y.o. female with a history of gallstones as well as a cholecystectomy in 2012 was presenting with right upper quadrant abdominal pain radiating through to her back over the past day. She says that the pain is sharp and moderate to severe. She says it is associated with nausea and vomiting. She says that every time she eats or drinks she vomits about 15 minutes afterward.  Says that she was given Zofran out front in triage which initially helped but now she is feeling like the nausea is returning. Also with nonbloody diarrhea today with greater than 4 episodes. Patient denies any known sick contacts.    Past Medical History:  Diagnosis Date  . A-fib ([place])   . Diverticulosis   . Gallstones   . Headache   . Kidney stones   . Pneumonia     Patient Active Problem List   Diagnosis Date Noted  . Skin abnormality 08/01/2016  . Tachycardia 08/01/2016  . Encounter for Depo-Provera contraception 08/01/2016  . Migraines 08/01/2016    Past Surgical History:  Procedure Laterality Date  . CESAREAN SECTION    . CHOLECYSTECTOMY    . GALLBLADDER SURGERY    . TONSILLECTOMY      Prior to Admission medications   Medication Sig Start Date End Date Taking? Authorizing Provider  butalbital-acetaminophen-caffeine (FIORICET, ESGIC) 50-325-40 MG tablet Take 2 tablets by mouth 2 (two) times daily as needed for headache.    Historical Provider, MD  ciclopirox (LOPROX) 0.77 % cream Apply topically 2 (two) times daily. 08/01/16   Pleas Koch, NP  metoprolol (LOPRESSOR) 50 MG tablet Take 1 tablet (50 mg total) by mouth 2 (two) times daily. 08/01/16   Pleas Koch,  NP    Allergies Lyrica [pregabalin]; Eggs or egg-derived products; and Latex  Family History  Problem Relation Age of Onset  . Colon cancer Paternal Grandmother   . Ulcerative colitis    . Diabetes    . Kidney disease    . Stomach cancer Maternal Grandfather     Social History Social History  Substance Use Topics  . Smoking status: Current Some Day Smoker    Packs/day: 0.50    Types: Cigarettes  . Smokeless tobacco: Never Used     Comment: smokes "once or twice a week"  . Alcohol use No    Review of Systems Constitutional: No fever/chills Eyes: No visual changes. ENT: No sore throat. Cardiovascular: Denies chest pain. Respiratory: Denies shortness of breath. Gastrointestinal: .  No constipation. Genitourinary: Negative for dysuria. Musculoskeletal: Negative for back pain. Skin: Negative for rash. Neurological: Negative for headaches, focal weakness or numbness.  10-point ROS otherwise negative.  ____________________________________________   PHYSICAL EXAM:  VITAL SIGNS: ED Triage Vitals  Enc Vitals Group     BP 08/01/16 1831 118/77     Pulse Rate 08/01/16 1831 98     Resp 08/01/16 1831 18     Temp 08/01/16 1831 98.6 F (37 C)     Temp Source 08/01/16 1831 Oral     SpO2 08/01/16 1831 99 %     Weight 08/01/16 1832 202 lb (91.6 kg)     Height 08/01/16 1832 5\' 5"  (  1.651 m)     Head Circumference --      Peak Flow --      Pain Score 08/01/16 1841 9     Pain Loc --      Pain Edu? --      Excl. in [place]? --     Constitutional: Alert and oriented. Well appearing and in no acute distress. Eyes: Conjunctivae are normal. PERRL. EOMI. Head: Atraumatic. Nose: No congestion/rhinnorhea. Mouth/Throat: Mucous membranes are moist.  Neck: No stridor.   Cardiovascular: Normal rate, regular rhythm. Grossly normal heart sounds.   Respiratory: Normal respiratory effort.  No retractions. Lungs CTAB. Gastrointestinal: Soft with tenderness to the right upper quadrant with  a negative Murphy sign. Also with epigastric and left upper quadrant tenderness palpation. No lower abdominal tenderness. No distention. No CVA tenderness. Musculoskeletal: No lower extremity tenderness nor edema.  No joint effusions. Neurologic:  Normal speech and language. No gross focal neurologic deficits are appreciated. No gait instability. Skin:  Skin is warm, dry and intact. No rash noted. Psychiatric: Mood and affect are normal. Speech and behavior are normal.  ____________________________________________   LABS (all labs ordered are listed, but only abnormal results are displayed)  Labs Reviewed  COMPREHENSIVE METABOLIC PANEL - Abnormal; Notable for the following:       Result Value   Potassium 3.4 (*)    Alkaline Phosphatase 37 (*)    All other components within normal limits  LIPASE, BLOOD  CBC  URINALYSIS COMPLETEWITH MICROSCOPIC ([facility] ONLY)  POC URINE PREG, ED   ____________________________________________  EKG   ____________________________________________  RADIOLOGY   ____________________________________________   PROCEDURES  Procedure(s) performed:   Procedures  Critical Care performed:   ____________________________________________   INITIAL IMPRESSION / ASSESSMENT AND PLAN / ED COURSE  Pertinent labs & imaging results that were available during my care of the patient were reviewed by me and considered in my medical decision making (see chart for details).  ----------------------------------------- 9:01 PM on 08/01/2016 -----------------------------------------  I reviewed the patient's labs as well and she had similar episodes of beginning of the summer where she visited this emergency department as well as [facility]. She had a CAT scan at that time which did not reveal any acute pathology. She said that her symptoms for a period of time were relieved with antacids but seemed to be returning at this time. I discussed with her trying additional  doses Zofran as well as GI cocktail which seemed to relieve her symptoms last time she was at [facility]. However, the patient then eloped without receiving further testing or medication. I discussed with the patient prior to her leaving that if the medicines did not work and we may need to pursue imaging. She was aware of this plan. She was clinically sober and had capacity to make her medical decisions.  Clinical Course     ____________________________________________   FINAL CLINICAL IMPRESSION(S) / ED DIAGNOSES  Upper abdominal pain with nausea vomiting and diarrhea.    NEW MEDICATIONS STARTED DURING THIS VISIT:  New Prescriptions   No medications on file     Note:  This document was prepared using Dragon voice recognition software and may include unintentional dictation errors.    Orbie Pyo, MD 08/01/16 2102

## 2016-08-01 NOTE — Assessment & Plan Note (Signed)
Long history of, follows with [facility] neurology. Managed on Fioricet as needed, uses sparingly. Overall feels well managed. Will obtain UDS and controlled substance contract when refills are needed.

## 2016-08-03 MED ORDER — MEDROXYPROGESTERONE ACETATE 150 MG/ML IM SUSP
150.0000 mg | Freq: Once | INTRAMUSCULAR | Status: AC
  Administered 2016-08-03: 150 mg via INTRAMUSCULAR

## 2016-08-03 MED ORDER — MEDROXYPROGESTERONE ACETATE 150 MG/ML IM SUSY
150.0000 mg | PREFILLED_SYRINGE | Freq: Once | INTRAMUSCULAR | Status: AC
  Administered 2016-08-01: 150 mg via INTRAMUSCULAR

## 2016-08-03 NOTE — Addendum Note (Signed)
Addended by: Jacqualin Combes on: 08/03/2016 04:21 PM   Modules accepted: Orders

## 2016-08-03 NOTE — Addendum Note (Signed)
Addended by: Jacqualin Combes on: 08/03/2016 04:26 PM   Modules accepted: Orders

## 2016-08-21 ENCOUNTER — Encounter: Payer: 59 | Admitting: Primary Care

## 2016-08-21 DIAGNOSIS — Z0289 Encounter for other administrative examinations: Secondary | ICD-10-CM

## 2016-08-24 ENCOUNTER — Encounter: Payer: Self-pay | Admitting: Primary Care

## 2016-09-16 ENCOUNTER — Encounter: Payer: Self-pay | Admitting: Emergency Medicine

## 2016-09-16 ENCOUNTER — Emergency Department: Payer: 59

## 2016-09-16 ENCOUNTER — Emergency Department
Admission: EM | Admit: 2016-09-16 | Discharge: 2016-09-16 | Disposition: A | Discharge status: Home / Self Care | Payer: 59 | Attending: Student in an Organized Health Care Education/Training Program | Admitting: Student in an Organized Health Care Education/Training Program

## 2016-09-16 DIAGNOSIS — N83291 Other ovarian cyst, right side: Secondary | ICD-10-CM | POA: Diagnosis not present

## 2016-09-16 DIAGNOSIS — N898 Other specified noninflammatory disorders of vagina: Secondary | ICD-10-CM | POA: Insufficient documentation

## 2016-09-16 DIAGNOSIS — N83201 Unspecified ovarian cyst, right side: Secondary | ICD-10-CM | POA: Diagnosis not present

## 2016-09-16 DIAGNOSIS — R1030 Lower abdominal pain, unspecified: Secondary | ICD-10-CM | POA: Diagnosis not present

## 2016-09-16 DIAGNOSIS — R1031 Right lower quadrant pain: Secondary | ICD-10-CM | POA: Diagnosis not present

## 2016-09-16 DIAGNOSIS — Z79899 Other long term (current) drug therapy: Secondary | ICD-10-CM | POA: Diagnosis not present

## 2016-09-16 DIAGNOSIS — F17210 Nicotine dependence, cigarettes, uncomplicated: Secondary | ICD-10-CM | POA: Insufficient documentation

## 2016-09-16 DIAGNOSIS — Z85828 Personal history of other malignant neoplasm of skin: Secondary | ICD-10-CM | POA: Insufficient documentation

## 2016-09-16 DIAGNOSIS — N83209 Unspecified ovarian cyst, unspecified side: Secondary | ICD-10-CM

## 2016-09-16 HISTORY — DX: Unspecified ovarian cyst, unspecified side: N83.209

## 2016-09-16 HISTORY — DX: Malignant (primary) neoplasm, unspecified: C80.1

## 2016-09-16 LAB — COMPREHENSIVE METABOLIC PANEL
ALBUMIN: 4 g/dL (ref 3.5–5.0)
ALT: 21 U/L (ref 14–54)
AST: 21 U/L (ref 15–41)
Alkaline Phosphatase: 46 U/L (ref 38–126)
Anion gap: 8 (ref 5–15)
BUN: 9 mg/dL (ref 6–20)
CHLORIDE: 106 mmol/L (ref 101–111)
CO2: 22 mmol/L (ref 22–32)
CREATININE: 0.68 mg/dL (ref 0.44–1.00)
Calcium: 9 mg/dL (ref 8.9–10.3)
GFR calc non Af Amer: >60 mL/min (ref >60)
Glucose, Bld: 94 mg/dL (ref 65–99)
Potassium: 3.5 mmol/L (ref 3.5–5.1)
SODIUM: 136 mmol/L (ref 135–145)
Total Bilirubin: 0.8 mg/dL (ref 0.3–1.2)
Total Protein: 7.2 g/dL (ref 6.5–8.1)

## 2016-09-16 LAB — CBC WITH DIFFERENTIAL/PLATELET
BASOS ABS: 0 10*3/uL (ref 0–0.1)
BASOS PCT: 0 %
EOS ABS: 0.1 10*3/uL (ref 0–0.7)
EOS PCT: 1 %
HCT: 40.8 % (ref 35.0–47.0)
Hemoglobin: 14.1 g/dL (ref 12.0–16.0)
Lymphocytes Relative: 18 %
Lymphs Abs: 1.8 10*3/uL (ref 1.0–3.6)
MCH: 33 pg (ref 26.0–34.0)
MCHC: 34.6 g/dL (ref 32.0–36.0)
MCV: 95.5 fL (ref 80.0–100.0)
Monocytes Absolute: 0.5 10*3/uL (ref 0.2–0.9)
Monocytes Relative: 5 %
Neutro Abs: 7.8 10*3/uL — ABNORMAL HIGH (ref 1.4–6.5)
Neutrophils Relative %: 76 %
PLATELETS: 241 10*3/uL (ref 150–440)
RBC: 4.27 MIL/uL (ref 3.80–5.20)
RDW: 12.8 % (ref 11.5–14.5)
WBC: 10.3 10*3/uL (ref 3.6–11.0)

## 2016-09-16 LAB — WET PREP, GENITAL
Sperm: NONE SEEN
Trich, Wet Prep: NONE SEEN
YEAST WET PREP: NONE SEEN

## 2016-09-16 LAB — LIPASE, BLOOD: Lipase: 18 U/L (ref 11–51)

## 2016-09-16 LAB — CHLAMYDIA/NGC RT PCR (ARMC ONLY)
CHLAMYDIA TR: NOT DETECTED
N gonorrhoeae: DETECTED — AB

## 2016-09-16 LAB — HCG, QUANTITATIVE, PREGNANCY

## 2016-09-16 MED ORDER — SODIUM CHLORIDE 0.9 % IV BOLUS (SEPSIS)
1000.0000 mL | Freq: Once | INTRAVENOUS | Status: AC
  Administered 2016-09-16: 1000 mL via INTRAVENOUS

## 2016-09-16 MED ORDER — HYDROMORPHONE HCL 1 MG/ML IJ SOLN
1.0000 mg | Freq: Once | INTRAMUSCULAR | Status: AC
  Administered 2016-09-16: 1 mg via INTRAVENOUS
  Filled 2016-09-16: qty 1

## 2016-09-16 MED ORDER — METRONIDAZOLE 0.75 % VA GEL
1.0000 | Freq: Two times a day (BID) | VAGINAL | 0 refills | Status: AC
Start: 2016-09-16 — End: 2016-09-21

## 2016-09-16 MED ORDER — CEFTRIAXONE SODIUM 250 MG IJ SOLR
250.0000 mg | Freq: Once | INTRAMUSCULAR | Status: AC
  Administered 2016-09-16: 250 mg via INTRAMUSCULAR
  Filled 2016-09-16: qty 250

## 2016-09-16 MED ORDER — HYDROMORPHONE HCL 1 MG/ML IJ SOLN
0.5000 mg | Freq: Once | INTRAMUSCULAR | Status: AC
  Administered 2016-09-16: 0.5 mg via INTRAVENOUS
  Filled 2016-09-16: qty 1

## 2016-09-16 MED ORDER — PROMETHAZINE HCL 25 MG PO TABS
25.0000 mg | ORAL_TABLET | Freq: Once | ORAL | Status: AC
  Administered 2016-09-16: 25 mg via ORAL
  Filled 2016-09-16: qty 1

## 2016-09-16 MED ORDER — LIDOCAINE HCL (PF) 1 % IJ SOLN
INTRAMUSCULAR | Status: AC
  Administered 2016-09-16: 5 mL
  Filled 2016-09-16: qty 5

## 2016-09-16 MED ORDER — IOPAMIDOL (ISOVUE-300) INJECTION 61%
30.0000 mL | Freq: Once | INTRAVENOUS | Status: AC | PRN
  Administered 2016-09-16: 30 mL via ORAL

## 2016-09-16 MED ORDER — HYDROMORPHONE HCL 1 MG/ML IJ SOLN
0.5000 mg | Freq: Once | INTRAMUSCULAR | Status: DC
  Filled 2016-09-16: qty 1

## 2016-09-16 MED ORDER — DOXYCYCLINE HYCLATE 50 MG PO CAPS
50.0000 mg | ORAL_CAPSULE | Freq: Two times a day (BID) | ORAL | 0 refills | Status: AC
Start: 2016-09-16 — End: 2016-09-30

## 2016-09-16 MED ORDER — NAPROXEN 500 MG PO TABS: 500.0000 mg | ORAL_TABLET | Freq: Two times a day (BID) | ORAL | 0 refills | Status: AC

## 2016-09-16 MED ORDER — IOPAMIDOL (ISOVUE-300) INJECTION 61%
100.0000 mL | Freq: Once | INTRAVENOUS | Status: AC | PRN
  Administered 2016-09-16: 100 mL via INTRAVENOUS

## 2016-09-16 MED ORDER — HYDROCODONE-ACETAMINOPHEN 5-325 MG PO TABS
1.0000 | ORAL_TABLET | Freq: Once | ORAL | Status: AC
  Administered 2016-09-16: 1 via ORAL
  Filled 2016-09-16: qty 1

## 2016-09-16 MED ORDER — PROMETHAZINE HCL 25 MG/ML IJ SOLN
12.5000 mg | Freq: Once | INTRAMUSCULAR | Status: AC
  Administered 2016-09-16: 12.5 mg via INTRAVENOUS
  Filled 2016-09-16: qty 1

## 2016-09-16 MED ORDER — FENTANYL CITRATE (PF) 100 MCG/2ML IJ SOLN
100.0000 ug | INTRAMUSCULAR | Status: DC | PRN
  Administered 2016-09-16: 100 ug via INTRAVENOUS
  Filled 2016-09-16: qty 2

## 2016-09-16 MED ORDER — DOXYCYCLINE HYCLATE 100 MG PO TABS
100.0000 mg | ORAL_TABLET | Freq: Once | ORAL | Status: AC
  Administered 2016-09-16: 100 mg via ORAL
  Filled 2016-09-16: qty 1

## 2016-09-16 MED ORDER — AZITHROMYCIN 1 G PO PACK
1.0000 g | PACK | Freq: Once | ORAL | Status: AC
  Administered 2016-09-16: 1 g via ORAL
  Filled 2016-09-16: qty 1

## 2016-09-16 MED ORDER — HYDROCODONE-ACETAMINOPHEN 5-325 MG PO TABS: 1.0000 | ORAL_TABLET | ORAL | 0 refills | Status: DC | PRN

## 2016-09-16 MED ORDER — PROMETHAZINE HCL 12.5 MG PO TABS: 12.5000 mg | ORAL_TABLET | Freq: Four times a day (QID) | ORAL | 0 refills | Status: DC | PRN

## 2016-09-16 MED ORDER — IBUPROFEN 600 MG PO TABS
600.0000 mg | ORAL_TABLET | Freq: Once | ORAL | Status: AC
  Administered 2016-09-16: 600 mg via ORAL
  Filled 2016-09-16: qty 1

## 2016-09-16 NOTE — Discharge Instructions (Signed)

## 2016-09-16 NOTE — ED Notes (Addendum)
Hydromorphone discrepancy occurred in the pyxis. The original order was not given. New order placed to ensure correct dose given. Pyxis drawer was verified with charge nurse.

## 2016-09-16 NOTE — ED Triage Notes (Signed)
Pt arrived to ED by EMS with LLQ and RLQ abdominal pain that started yesterday. Pt states she attempted to walk outside today and almost passed out. Pt is grimacing and guarding her right side. Pt is also dyphoretic.

## 2016-09-16 NOTE — ED Notes (Signed)
Patient transported to CT 

## 2016-09-16 NOTE — ED Notes (Signed)
Patient transported to Ultrasound 

## 2016-09-16 NOTE — ED Provider Notes (Addendum)
[facility] Emergency Department Provider Note    First MD Initiated Contact with Patient 09/16/16 1300     (approximate)  I have reviewed the triage vital signs and the nursing notes.   HISTORY  Chief Complaint Abdominal Pain    HPI Katherine Murray is a 35 y.o. female G1P1 with a history of diverticulosis and kidney stones presents with acute right lower quadrant abdominal pain that started yesterday in the periumbilical left upper quadrant region and now radiated to the right lower quadrant. Patient states that she woke up this morning with severe pain and trouble standing up. She is able to encourage herself to go eat breakfast and had a small bite he had sudden worsening of symptoms. She then tried to walk outside and had near-syncopal episode where the mother witnessed her become diaphoretic and pale. States that there is no chance that she is pregnant as she is on Depakote. Denies any vaginal bleeding. Feels nauseated. Has had chills but no measured fevers.   Past Medical History:  Diagnosis Date  . A-fib ([place])   . Cancer ([place])   . Diverticulosis   . Gallstones   . Headache   . Kidney stones   . Mitral regurgitation    Mild  . Pneumonia   . Tricuspid regurgitation    Trace    Patient Active Problem List   Diagnosis Date Noted  . Skin abnormality 08/01/2016  . Tachycardia 08/01/2016  . Encounter for Depo-Provera contraception 08/01/2016  . Migraines 08/01/2016    Past Surgical History:  Procedure Laterality Date  . CESAREAN SECTION    . CHOLECYSTECTOMY    . GALLBLADDER SURGERY    . TONSILLECTOMY      Prior to Admission medications   Medication Sig Start Date End Date Taking? Authorizing Provider  metoprolol (LOPRESSOR) 50 MG tablet Take 1 tablet (50 mg total) by mouth 2 (two) times daily. 08/01/16  Yes Pleas Koch, NP  butalbital-acetaminophen-caffeine (FIORICET, ESGIC) [phone] MG tablet Take 2 tablets by mouth 2 (two) times  daily as needed for headache.    Historical Provider, MD  doxycycline (VIBRAMYCIN) 50 MG capsule Take 1 capsule (50 mg total) by mouth 2 (two) times daily. 09/16/16 09/30/16  Merlyn Lot, MD  HYDROcodone-acetaminophen (NORCO) 5-325 MG tablet Take 1 tablet by mouth every 4 (four) hours as needed for moderate pain. 09/16/16   Merlyn Lot, MD  metroNIDAZOLE (METROGEL VAGINAL) 0.75 % vaginal gel Place 1 Applicatorful vaginally 2 (two) times daily. 09/16/16 09/21/16  Merlyn Lot, MD  naproxen (NAPROSYN) 500 MG tablet Take 1 tablet (500 mg total) by mouth 2 (two) times daily with a meal. 09/16/16 09/26/16  Merlyn Lot, MD  promethazine (PHENERGAN) 12.5 MG tablet Take 1 tablet (12.5 mg total) by mouth every 6 (six) hours as needed for nausea or vomiting. 09/16/16   Merlyn Lot, MD    Allergies Lyrica [pregabalin]; Eggs or egg-derived products; and Latex  Family History  Problem Relation Age of Onset  . Colon cancer Paternal Grandmother   . Ulcerative colitis    . Diabetes    . Kidney disease    . Stomach cancer Maternal Grandfather     Social History Social History  Substance Use Topics  . Smoking status: Current Some Day Smoker    Packs/day: 0.50    Types: Cigarettes  . Smokeless tobacco: Never Used     Comment: smokes "once or twice a week"  . Alcohol use No    Review  of Systems Patient denies headaches, rhinorrhea, blurry vision, numbness, shortness of breath, chest pain, edema, cough, abdominal pain, nausea, vomiting, diarrhea, dysuria, fevers, rashes or hallucinations unless otherwise stated above in HPI. ____________________________________________   PHYSICAL EXAM:  VITAL SIGNS: Vitals:   09/16/16 1915 09/16/16 1930  BP:  96/63  Pulse: 94 89  Resp: (!) 24 16  Temp:      Constitutional: Alert and oriented. Acutely ill appearing  Eyes: Conjunctivae are normal. PERRL. EOMI. Head: Atraumatic. Nose: No congestion/rhinnorhea. Mouth/Throat: Mucous  membranes are moist.  Oropharynx non-erythematous. Neck: No stridor. Painless ROM. No cervical spine tenderness to palpation Hematological/Lymphatic/Immunilogical: No cervical lymphadenopathy. Cardiovascular: tachycardic, regular rhythm. Grossly normal heart sounds.  Good peripheral circulation. Respiratory: Normal respiratory effort.  No retractions. Lungs CTAB. Gastrointestinal: Soft but with guarding of RLQ.  + rovsigs sign. No distention. No abdominal bruits. No CVA tenderness. Gu: with purulent discharge from cervical oss with right adnexal ttp.  Cervix appear healthy and non-friable.  Musculoskeletal: No lower extremity tenderness nor edema.  No joint effusions. Neurologic:  Normal speech and language. No gross focal neurologic deficits are appreciated. No gait instability. Skin:  Skin is warm, dry and intact. No rash noted. Psychiatric: Mood and affect are normal. Speech and behavior are normal.  ____________________________________________   LABS (all labs ordered are listed, but only abnormal results are displayed)  Results for orders placed or performed during the hospital encounter of 09/16/16 (from the past 24 hour(s))  CBC with Differential/Platelet     Status: Abnormal   Collection Time: 09/16/16  1:02 PM  Result Value Ref Range   WBC 10.3 3.6 - 11.0 K/uL   RBC 4.27 3.80 - 5.20 MIL/uL   Hemoglobin 14.1 12.0 - 16.0 g/dL   HCT 40.8 35.0 - 47.0 %   MCV 95.5 80.0 - 100.0 fL   MCH 33.0 26.0 - 34.0 pg   MCHC 34.6 32.0 - 36.0 g/dL   RDW 12.8 11.5 - 14.5 %   Platelets 241 150 - 440 K/uL   Neutrophils Relative % 76 %   Neutro Abs 7.8 (H) 1.4 - 6.5 K/uL   Lymphocytes Relative 18 %   Lymphs Abs 1.8 1.0 - 3.6 K/uL   Monocytes Relative 5 %   Monocytes Absolute 0.5 0.2 - 0.9 K/uL   Eosinophils Relative 1 %   Eosinophils Absolute 0.1 0 - 0.7 K/uL   Basophils Relative 0 %   Basophils Absolute 0.0 0 - 0.1 K/uL  Comprehensive metabolic panel     Status: None   Collection Time:  09/16/16  1:02 PM  Result Value Ref Range   Sodium 136 135 - 145 mmol/L   Potassium 3.5 3.5 - 5.1 mmol/L   Chloride 106 101 - 111 mmol/L   CO2 22 22 - 32 mmol/L   Glucose, Bld 94 65 - 99 mg/dL   BUN 9 6 - 20 mg/dL   Creatinine, Ser 0.68 0.44 - 1.00 mg/dL   Calcium 9.0 8.9 - 10.3 mg/dL   Total Protein 7.2 6.5 - 8.1 g/dL   Albumin 4.0 3.5 - 5.0 g/dL   AST 21 15 - 41 U/L   ALT 21 14 - 54 U/L   Alkaline Phosphatase 46 38 - 126 U/L   Total Bilirubin 0.8 0.3 - 1.2 mg/dL   GFR calc non Af Amer >60 >60 mL/min   GFR calc Af Amer >60 >60 mL/min   Anion gap 8 5 - 15  Lipase, blood     Status: None  Collection Time: 09/16/16  1:02 PM  Result Value Ref Range   Lipase 18 11 - 51 U/L  hCG, quantitative, pregnancy     Status: None   Collection Time: 09/16/16  1:21 PM  Result Value Ref Range   hCG, Beta Chain, Quant, S <1 <5 mIU/mL  Wet prep, genital     Status: Abnormal   Collection Time: 09/16/16  7:07 PM  Result Value Ref Range   Yeast Wet Prep HPF POC NONE SEEN NONE SEEN   Trich, Wet Prep NONE SEEN NONE SEEN   Clue Cells Wet Prep HPF POC PRESENT (A) NONE SEEN   WBC, Wet Prep HPF POC MANY (A) NONE SEEN   Sperm NONE SEEN    ____________________________________________ ______________________________  RADIOLOGY  I personally reviewed all radiographic images ordered to evaluate for the above acute complaints and reviewed radiology reports and findings.  These findings were personally discussed with the patient.  Please see medical record for radiology report.  ____________________________________________   PROCEDURES  Procedure(s) performed: none    Critical Care performed: no ____________________________________________   INITIAL IMPRESSION / ASSESSMENT AND PLAN / ED COURSE  Pertinent labs & imaging results that were available during my care of the patient were reviewed by me and considered in my medical decision making (see chart for details).  DDX: apy, diverticulitis,  sbo, colitis, msk strain, uti, ectopic  Katherine Murray is a 35 y.o. who presents to the ED with acute onset right lower quadrant pain that initially started in the mid abdominal region. Patient is afebrile currently but borderline hypotensive and tachycardic. Bedside ultrasound shows no free fluid in the abdomen. Ectopic is less likely though still on the differential. Will order CT imaging to further characterize.  The patient will be placed on continuous pulse oximetry and telemetry for monitoring.  Laboratory evaluation will be sent to evaluate for the above complaints.     Clinical Course  Comment By Time  Patient reassessed. Discussed findings of ultrasound and CT scans. Patient states her pain is improved. Had some discomfort on ultrasound. We'll perform pelvic exam to rule out any signs of infection or PID. Merlyn Lot, MD 10/29 1850  Based on her exam I will treat empirically for PID. She has a reassuring abdominal exam without any peritonitis and ultrasound with no evidence of ovarian edema and good Doppler flows do not feel this is clinically consistent with ovarian torsion. Merlyn Lot, MD 10/29 1932  I discussed strict return precautions particularly those signs or symptoms which may indicate ovarian torsion. I spoke with Dr. Glennon Mac of OB/GYN who agrees to see patient in his clinic.  Have discussed with the patient and available family all diagnostics and treatments performed thus far and all questions were answered to the best of my ability. The patient demonstrates understanding and agreement with plan.  Merlyn Lot, MD 10/29 1952     ____________________________________________   FINAL CLINICAL IMPRESSION(S) / ED DIAGNOSES  Final diagnoses:  RLQ abdominal pain  Right lower quadrant abdominal pain  Cyst of right ovary      NEW MEDICATIONS STARTED DURING THIS VISIT:  New Prescriptions   DOXYCYCLINE (VIBRAMYCIN) 50 MG CAPSULE    Take 1 capsule (50 mg total)  by mouth 2 (two) times daily.   HYDROCODONE-ACETAMINOPHEN (NORCO) 5-325 MG TABLET    Take 1 tablet by mouth every 4 (four) hours as needed for moderate pain.   METRONIDAZOLE (METROGEL VAGINAL) 0.75 % VAGINAL GEL    Place 1 Applicatorful  vaginally 2 (two) times daily.   NAPROXEN (NAPROSYN) 500 MG TABLET    Take 1 tablet (500 mg total) by mouth 2 (two) times daily with a meal.   PROMETHAZINE (PHENERGAN) 12.5 MG TABLET    Take 1 tablet (12.5 mg total) by mouth every 6 (six) hours as needed for nausea or vomiting.     Note:  This document was prepared using Dragon voice recognition software and may include unintentional dictation errors.    Merlyn Lot, MD 09/16/16 [place], MD 09/16/16 [phone]

## 2016-09-17 ENCOUNTER — Telehealth: Payer: Self-pay | Admitting: Emergency Medicine

## 2016-09-17 NOTE — Telephone Encounter (Addendum)
Called patient to give std test restults-positve gonorrhea.  Patient was treated in the ED. I left a message asking patient to call me.   Patient called me back.  Explained dx and need for partner treatment.

## 2016-09-18 ENCOUNTER — Encounter: Payer: Self-pay | Admitting: Primary Care

## 2016-09-18 ENCOUNTER — Ambulatory Visit (INDEPENDENT_AMBULATORY_CARE_PROVIDER_SITE_OTHER): Payer: 59 | Admitting: Primary Care

## 2016-09-18 VITALS — BP 118/74 | HR 92 | Temp 98.4°F | Ht 65.0 in | Wt 214.0 lb

## 2016-09-18 DIAGNOSIS — N83201 Unspecified ovarian cyst, right side: Secondary | ICD-10-CM

## 2016-09-18 MED ORDER — PROMETHAZINE HCL 12.5 MG PO TABS: 12.5000 mg | ORAL_TABLET | Freq: Four times a day (QID) | ORAL | 0 refills | Status: DC | PRN

## 2016-09-18 MED ORDER — HYDROCODONE-ACETAMINOPHEN 5-325 MG PO TABS: 1.0000 | ORAL_TABLET | Freq: Four times a day (QID) | ORAL | 0 refills | Status: DC | PRN

## 2016-09-18 NOTE — Patient Instructions (Addendum)
Stop by the front desk and speak with either Rosaria Ferries or Ebony Hail regarding your referral to GYN.  It was a pleasure to see you today!

## 2016-09-18 NOTE — Progress Notes (Signed)
Pre visit review using our clinic review tool, if applicable. No additional management support is needed unless otherwise documented below in the visit note. 

## 2016-09-18 NOTE — Progress Notes (Signed)
Subjective:    Patient ID: Katherine Murray, female    DOB: Oct 30, 1981, 35 y.o.   MRN: [id number]  HPI  Katherine Murray is a 35 year old female who presents today for emergency department follow up.   She presented to [facility] ED on 10/29 with a chief complaint of acute RLQ abdominal pain with initial pain to the left upper periumbilical region. She underwent treatment in the ED including CT abdomen/pelvis with evidence of dominant follicle/cyst to the right ovary. She also underwent transvaginal and pelvic ultrasound without evidence of ovarian torsion but visualization of 4.4 cm right ovarian follicle. She also underwent lab testing (positive for gonorrhea, negative pregnancy test, CBC, CMP, Lipase). She was treated for PID with prescriptions for Doxycycline, Norco, Metronidazole Gel, Naproxen, and Phenergan. She was recommended to follow up with her GYN for further evaluation. She was discharged later that day from the Emergency Department.  Since her ED visit she's not yet followed with GYN. She was recommend to have the right ovarian cyst surgically removed. She continues to experience pain to the RLQ. She is complaint to her antibiotics. She is needing a refill of her phenergan and Norco.  2) Obesity: Weight gain of 12 pounds in the last 6 weeks. She is currently managed on Depo-Provera for which she's taken for the past 5 years. She denies changes in her diet.  Her diet currently consists of: Breakfast: Oatmeal, sometimes skips Lunch: Skips Dinner: Vegetable, meat, occasional fast food Snacks: Occasional chips Desserts: None Beverages: Non calorie flavored water  Review of Systems  Constitutional: Positive for unexpected weight change. Negative for fatigue and fever.  Gastrointestinal: Positive for abdominal pain and nausea. Negative for vomiting.  Genitourinary: Positive for pelvic pain. Negative for dysuria, flank pain and vaginal discharge.       Past Medical History:  Diagnosis Date    . A-fib ([place])   . Cancer ([place])   . Diverticulosis   . Gallstones   . Headache   . Kidney stones   . Mitral regurgitation    Mild  . Pneumonia   . Tricuspid regurgitation    Trace     Social History   Social History  . Marital status: Single    Spouse name: N/A  . Number of children: N/A  . 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 Smoking status: Current Some Day Smoker    Packs/day: 0.50    Types: Cigarettes  . Smokeless tobacco: Never Used     Comment: smokes "once or twice a week"  . Alcohol use No  . Drug use: No  . Sexual activity: Yes    Birth control/ protection: Injection   Other Topics Concern  . Not on file   Social History Narrative  . No narrative on file    Past Surgical History:  Procedure Laterality Date  . CESAREAN SECTION    . CHOLECYSTECTOMY    . GALLBLADDER SURGERY    . TONSILLECTOMY      Family History  Problem Relation Age of Onset  . Colon cancer Paternal Grandmother   . Ulcerative colitis    . Diabetes    . Kidney disease    . Stomach cancer Maternal Grandfather     Allergies  Allergen Reactions  . Lyrica [Pregabalin] Swelling  . Eggs Or Egg-Derived Products Rash and Swelling  . Latex Rash    Current Outpatient  Prescriptions on File Prior to Visit  Medication Sig Dispense Refill  . butalbital-acetaminophen-caffeine (FIORICET, ESGIC) 50-325-40 MG tablet Take 2 tablets by mouth 2 (two) times daily as needed for headache.    . doxycycline (VIBRAMYCIN) 50 MG capsule Take 1 capsule (50 mg total) by mouth 2 (two) times daily. 28 capsule 0  . metoprolol (LOPRESSOR) 50 MG tablet Take 1 tablet (50 mg total) by mouth 2 (two) times daily. 90 tablet 3  . metroNIDAZOLE (METROGEL VAGINAL) 0.75 % vaginal gel Place 1 Applicatorful vaginally 2 (two) times daily. 100 g 0  . naproxen (NAPROSYN) 500 MG tablet Take 1 tablet (500 mg total) by mouth 2 (two) times  daily with a meal. 20 tablet 0   No current facility-administered medications on file prior to visit.     BP 118/74   Pulse 92   Temp 98.4 F (36.9 C) (Oral)   Ht 5\' 5"  (1.651 m)   Wt 214 lb (97.1 kg)   SpO2 98%   BMI 35.61 kg/m    Objective:   Physical Exam  Constitutional: She appears well-nourished. She does not appear ill.  Neck: Neck supple.  Cardiovascular: Normal rate and regular rhythm.   Pulmonary/Chest: Effort normal and breath sounds normal.  Abdominal: There is tenderness in the right lower quadrant and periumbilical area.  Skin: Skin is warm and dry.          Assessment & Plan:  Ovarian Cyst:  Located to right ovary measuring 4.4 cm.  Captured on CT and also through ultrasound. Overall stable today. Is compliant to antibiotics. Exam today with moderate RLQ and peri-umbillical tenderness. Does not appear sickly. Stable for outpatient treatment. Urgent GYN referral placed.  Strict ED precautions provided.  All ED notes, imaging, and labs reviewed.  Sheral Flow, NP

## 2016-09-19 ENCOUNTER — Ambulatory Visit (INDEPENDENT_AMBULATORY_CARE_PROVIDER_SITE_OTHER): Payer: 59 | Admitting: Obstetrics and Gynecology

## 2016-09-19 ENCOUNTER — Encounter: Payer: Self-pay | Admitting: Obstetrics and Gynecology

## 2016-09-19 VITALS — BP 104/71 | HR 87 | Ht 65.0 in | Wt 218.4 lb

## 2016-09-19 DIAGNOSIS — N83201 Unspecified ovarian cyst, right side: Secondary | ICD-10-CM | POA: Diagnosis not present

## 2016-09-19 DIAGNOSIS — N939 Abnormal uterine and vaginal bleeding, unspecified: Secondary | ICD-10-CM

## 2016-09-19 DIAGNOSIS — R102 Pelvic and perineal pain: Secondary | ICD-10-CM

## 2016-09-19 NOTE — Patient Instructions (Signed)
1. Laparoscopy with right ovarian cystectomy and peritoneal biopsies is scheduled for Monday 2. Return in 1 week after surgery for postop check 3. Continue with Norco for pain management as written 4. Preoperative appointment with anesthesia will be scheduled   Diagnostic Laparoscopy A diagnostic laparoscopy is a procedure to diagnose diseases in the abdomen. During the procedure, a thin, lighted, pencil-sized instrument called a laparoscope is inserted into the abdomen through an incision. The laparoscope allows your health care provider to look at the organs inside your body. LET [facility] CARE PROVIDER KNOW ABOUT:  Any allergies you have.  All medicines you are taking, including vitamins, herbs, eye drops, creams, and over-the-counter medicines.  Previous problems you or members of your family have had with the use of anesthetics.  Any blood disorders you have.  Previous surgeries you have had.  Medical conditions you have. RISKS AND COMPLICATIONS  Generally, this is a safe procedure. However, problems can occur, which may include:  Infection.  Bleeding.  Damage to other organs.  Allergic reaction to the anesthetics used during the procedure. BEFORE THE PROCEDURE  Do not eat or drink anything after midnight on the night before the procedure or as directed by your health care provider.  Ask your health care provider about:  Changing or stopping your regular medicines.  Taking medicines such as aspirin and ibuprofen. These medicines can thin your blood. Do not take these medicines before your procedure if your health care provider instructs you not to.  Plan to have someone take you home after the procedure. PROCEDURE  You may be given a medicine to help you relax (sedative).  You will be given a medicine to make you sleep (general anesthetic).  Your abdomen will be inflated with a gas. This will make your organs easier to see.  Small incisions will be made in  your abdomen.  A laparoscope and other small instruments will be inserted into the abdomen through the incisions.  A tissue sample may be removed from an organ in the abdomen for examination.  The instruments will be removed from the abdomen.  The gas will be released.  The incisions will be closed with stitches (sutures). AFTER THE PROCEDURE  Your blood pressure, heart rate, breathing rate, and blood oxygen level will be monitored often until the medicines you were given have worn off.   This information is not intended to replace advice given to you by your health care provider. Make sure you discuss any questions you have with your health care provider.   Document Released: 02/11/2001 Document Revised: 07/27/2015 Document Reviewed: 06/18/2014 Elsevier Interactive Patient Education [employer].

## 2016-09-19 NOTE — H&P (Signed)
Subjective:    Patient is a 35 y.o. G2P1038female scheduled for Laparoscopy with right ovarian cystectomy and peritoneal biopsies on 09/24/2016.  Recent history of new onset acute LV pain 1 week ago. Pain began in lower back with subsequent radiation to the front and then right lower quadrant. Pain was severe enough to prevent patient from getting out of bed and was associated with fatigue and lightheadedness. Emergency room evaluation demonstrated a 4.4 cm right ovarian cyst without evidence of torsion. A 2.2 cm left ovarian cyst was also seen. Patient has been using Norco for pain management; pain is currently characterized as 7/10 in intensity. The pain is sharp, stabbing, and now constant. The pain is made worse with movement especially going from sitting to standing positions.  In association with this onset of pain, the patient developed abnormal uterine bleeding. She has been on long-term Depo-Provera therapy with amenorrhea state until the last week. Patient does have history of dysmenorrhea in the past.    Pertinent Gynecological History: Menses: New onset abnormal bleeding on Depo-Provera Bleeding: dysfunctional uterine bleeding Contraception: Depo-Provera injections Last mammogram: N/A Date: N/A Last pap: Unknown     Menstrual History: OB History    Gravida Para Term Preterm AB Living   2 1 1     1    SAB TAB Ectopic Multiple Live Births           1      Menarche age: na  No LMP recorded. Patient has had an injection.    Past Medical History:  Diagnosis Date  . A-fib ([place])   . Cancer ([place])   . Diverticulosis   . Gallstones   . Headache   . Kidney stones   . Mitral regurgitation    Mild  . Ovarian cyst 09/16/2016   right measuring 4.4 cm  . Pneumonia   . Tricuspid regurgitation    Trace    Past Surgical History:  Procedure Laterality Date  . CESAREAN SECTION    . CHOLECYSTECTOMY    . GALLBLADDER SURGERY    . TONSILLECTOMY      OB History  Gravida Para  Term Preterm AB Living  2 1 1     1   SAB TAB Ectopic Multiple Live Births          1    # Outcome Date GA Lbr Len/2nd Weight Sex Delivery Anes PTL Lv  2 Term 2012   4 lb 1.8 oz (1.864 kg) F CS-LTranv   LIV  1 Gravida               Social History   Social History  . Marital status: Single    Spouse name: N/A  . Number of children: N/A  . 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 Smoking status: Current Some Day Smoker    Packs/day: 0.50    Types: Cigarettes  . Smokeless tobacco: Never Used     Comment: smokes "once or twice a week"  . Alcohol use No  . Drug use: No  . Sexual activity: Yes    Birth control/ protection: Injection   Other Topics Concern  . Not on file   Social History Narrative  . No narrative on file    Family History  Problem Relation Age of Onset  . Colon cancer Paternal Grandmother   . Diabetes Paternal Grandmother   . Ulcerative colitis    .  Kidney disease    . Stomach cancer Maternal Grandfather   . Diabetes Mother   . Diabetes Father   . Ovarian cancer Maternal Grandmother   . Breast cancer Neg Hx      (Not in a hospital admission)  Allergies  Allergen Reactions  . Lyrica [Pregabalin] Swelling  . Eggs Or Egg-Derived Products Rash and Swelling  . Latex Rash    Review of Systems Constitutional: No recent fever/chills/sweats Respiratory: No recent cough/bronchitis Cardiovascular: No chest pain Gastrointestinal: No recent nausea/vomiting/diarrhea Genitourinary: No UTI symptoms Hematologic/lymphatic:No history of coagulopathy or recent blood thinner use    Objective:    BP 104/71   Pulse 87   Ht 5\' 5"  (1.651 m)   Wt 218 lb 6.4 oz (99.1 kg)   BMI 36.34 kg/m   General:   Normal  Skin:   normal  HEENT:  Normal  Neck:  Supple without Adenopathy or Thyromegaly  Lungs:   Heart:              Breasts:   Abdomen:  Pelvis:  M/S   Extremeties:   Neuro:    clear to auscultation bilaterally   Normal without murmur   Not Examined   soft, non-tender; bowel sounds normal; no masses,  no organomegaly   Exam deferred to OR  No CVAT  Warm/Dry   Normal       09/19/2016 Pelvic: External genitalia-normal BUS-normal Vagina-menstrual blood in the vault Cervix-no gross lesions; 1/4 cervical motion tenderness Uterus-midplane, mobile, tender 2/4 Normal size and shape Adnexa-bilateral tenderness 2/4 without palpable mass Rectovaginal-normal external exam Assessment:    1. 4.4 cm right ovarian cyst 2. Acute onset pelvic pain 3. New onset abnormal uterine bleeding on Depo-Provera therapy Plan:  Laparoscopy with right ovarian cystectomy and peritoneal biopsies  Preop counseling: Patient is to undergo laparoscopy with right ovarian cystectomy and peritoneal biopsies on 09/24/2016. She is understanding of the planned procedure and is aware of and is accepting of all surgical risks which include but are not limited to bleeding, infection, pelvic organ injury with need for repair, blood clot disorders, anesthesia risks, etc. All questions have been answered. Informed consent is given. Patient is ready and willing to proceed with surgery as scheduled.  Brayton Mars, MD  Note: This dictation was prepared with Dragon dictation along with smaller phrase technology. Any transcriptional errors that result from this process are unintentional.

## 2016-09-19 NOTE — Progress Notes (Signed)
GYN ENCOUNTER NOTE  Subjective:       Katherine Murray is a 35 y.o. G91P1001 female is here for gynecologic evaluation of the following issues:  1. ER follow-up.   2. Right ovarian cyst 75.57 cm  35 year old white female para 1001, using Depo-Provera for contraception, presents in referral from the emergency room for follow-up of acute onset pelvic pain. Patient developed low back pain and right lower quadrant pain approximately 1 week ago. The pain progressed to involve the mid abdomen and subsequently right lower quadrant. The pain was severe where the patient had difficulty getting out of bed. She experienced lightheadedness, weakness, and was subsequently taken to the emergency room. Emergency room workup demonstrated a 4.4 cm right ovarian cyst and a septated 2.2 cm left ovarian cyst. She was treated with Norco upon discharge from the ER. Patient states that her pain has persisted with severity of pain being 7/10; it is sharp, stabbing, constant, made worse with movement especially getting up and down at work area bowel function is normal. Bladder function is normal. Patient has typically maintained amenorrhea state on Depo-Provera; however, she began bleeding several days ago.   Gynecologic History No LMP recorded. Patient has had an injection. Contraception: Depo-Provera injections Last Pap: Unknown Last mammogram: N/A  Obstetric History OB History  Gravida Para Term Preterm AB Living  2 1 1     1   SAB TAB Ectopic Multiple Live Births          1    # Outcome Date GA Lbr Len/2nd Weight Sex Delivery Anes PTL Lv  2 Term 2012   4 lb 1.8 oz (1.864 kg) F CS-LTranv   LIV  1 Gravida               Past Medical History:  Diagnosis Date  . A-fib ([place])   . Cancer ([place])   . Diverticulosis   . Gallstones   . Headache   . Kidney stones   . Mitral regurgitation    Mild  . Ovarian cyst 09/16/2016   right measuring 4.4 cm  . Pneumonia   . Tricuspid regurgitation    Trace    Past  Surgical History:  Procedure Laterality Date  . CESAREAN SECTION    . CHOLECYSTECTOMY    . GALLBLADDER SURGERY    . TONSILLECTOMY      Current Outpatient Prescriptions on File Prior to Visit  Medication Sig Dispense Refill  . butalbital-acetaminophen-caffeine (FIORICET, ESGIC) 50-325-40 MG tablet Take 2 tablets by mouth 2 (two) times daily as needed for headache.    . doxycycline (VIBRAMYCIN) 50 MG capsule Take 1 capsule (50 mg total) by mouth 2 (two) times daily. 28 capsule 0  . HYDROcodone-acetaminophen (NORCO) 5-325 MG tablet Take 1-2 tablets by mouth every 6 (six) hours as needed for moderate pain or severe pain. 30 tablet 0  . metoprolol (LOPRESSOR) 50 MG tablet Take 1 tablet (50 mg total) by mouth 2 (two) times daily. 90 tablet 3  . metroNIDAZOLE (METROGEL VAGINAL) 0.75 % vaginal gel Place 1 Applicatorful vaginally 2 (two) times daily. 100 g 0  . naproxen (NAPROSYN) 500 MG tablet Take 1 tablet (500 mg total) by mouth 2 (two) times daily with a meal. 20 tablet 0  . promethazine (PHENERGAN) 12.5 MG tablet Take 1 tablet (12.5 mg total) by mouth every 6 (six) hours as needed for nausea or vomiting. 20 tablet 0   No current facility-administered medications on file prior to visit.  Allergies  Allergen Reactions  . Lyrica [Pregabalin] Swelling  . Eggs Or Egg-Derived Products Rash and Swelling  . Latex Rash    Social History   Social History  . Marital status: Single    Spouse name: N/A  . Number of children: N/A  . 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 Smoking status: Current Some Day Smoker    Packs/day: 0.50    Types: Cigarettes  . Smokeless tobacco: Never Used     Comment: smokes "once or twice a week"  . Alcohol use No  . Drug use: No  . Sexual activity: Yes    Birth control/ protection: Injection   Other Topics Concern  . Not on file   Social History Narrative  . No narrative  on file    Family History  Problem Relation Age of Onset  . Colon cancer Paternal Grandmother   . Diabetes Paternal Grandmother   . Ulcerative colitis    . Kidney disease    . Stomach cancer Maternal Grandfather   . Diabetes Mother   . Diabetes Father   . Ovarian cancer Maternal Grandmother   . Breast cancer Neg Hx     The following portions of the patient's history were reviewed and updated as appropriate: allergies, current medications, past family history, past medical history, past social history, past surgical history and problem list.  Review of Systems Review of Systems - Per history of present illness  Objective:   BP 104/71   Pulse 87   Ht 5\' 5"  (1.651 m)   Wt 218 lb 6.4 oz (99.1 kg)   BMI 36.34 kg/m  CONSTITUTIONAL: Well-developed, well-nourished female in no acute distress.  HENT:  Normocephalic, atraumatic.  NECK: Normal range of motion, supple, no masses.  Normal thyroid.  SKIN: Skin is warm and dry. No rash noted. Not diaphoretic. No erythema. No pallor. [place]: Alert and oriented to person, place, and time. PSYCHIATRIC: Normal mood and affect. Normal behavior. Normal judgment and thought content. CARDIOVASCULAR:Not Examined RESPIRATORY: Not Examined BREASTS: Not Examined ABDOMEN: Soft, non distended; 2/4 right lower quadrant tender; no percussion tenderness.  No Organomegaly. PELVIC:  External Genitalia: Normal  BUS: Normal  Vagina: Normal; menstrual blood in vaginal vault  Cervix: Normal; no gross lesions; 1/4 cervical motion tenderness  Uterus: Mid plane, normal size, tender 2/4, mobile  Adnexa: Bilateral tenderness 2/4 without palpable mass  RV: Normal external exam  Bladder: Nontender MUSCULOSKELETAL: Normal range of motion. No tenderness.  No cyanosis, clubbing, or edema.     Assessment:   1. Pelvic pain in female  2. Cyst of right ovary, 4.4 cm  3. Abnormal uterine bleeding (AUB)      Plan:   1. Continue with Norco for  analgesia 2. Recommend laparoscopy with right ovarian cystectomy and peritoneal biopsies. Surgery is scheduled for Monday, 09/24/2016 3. Preoperative anesthesia workup will be obtained. 4. Return in 1 week postop for evaluation  Brayton Mars, MD  Note: This dictation was prepared with Dragon dictation along with smaller phrase technology. Any transcriptional errors that result from this process are unintentional.

## 2016-09-20 ENCOUNTER — Encounter
Admission: RE | Admit: 2016-09-20 | Discharge: 2016-09-20 | Disposition: A | Discharge status: Home / Self Care | Payer: 59 | Source: Ambulatory Visit | Attending: Obstetrics and Gynecology | Admitting: Obstetrics and Gynecology

## 2016-09-20 DIAGNOSIS — N83201 Unspecified ovarian cyst, right side: Secondary | ICD-10-CM | POA: Insufficient documentation

## 2016-09-20 DIAGNOSIS — Z01812 Encounter for preprocedural laboratory examination: Secondary | ICD-10-CM | POA: Insufficient documentation

## 2016-09-20 DIAGNOSIS — Z0289 Encounter for other administrative examinations: Secondary | ICD-10-CM

## 2016-09-20 DIAGNOSIS — J158 Pneumonia due to other specified bacteria: Secondary | ICD-10-CM | POA: Diagnosis not present

## 2016-09-20 DIAGNOSIS — R000 Tachycardia, unspecified: Secondary | ICD-10-CM | POA: Insufficient documentation

## 2016-09-20 DIAGNOSIS — Z01810 Encounter for preprocedural cardiovascular examination: Secondary | ICD-10-CM | POA: Diagnosis not present

## 2016-09-20 HISTORY — DX: Lower abdominal pain, unspecified: R10.30

## 2016-09-20 HISTORY — DX: Acute vaginitis: N76.0

## 2016-09-20 HISTORY — DX: Tachycardia, unspecified: R00.0

## 2016-09-20 LAB — RAPID HIV SCREEN (HIV 1/2 AB+AG)
HIV 1/2 ANTIBODIES: NONREACTIVE
HIV-1 P24 Antigen - HIV24: NONREACTIVE

## 2016-09-20 LAB — CBC WITH DIFFERENTIAL/PLATELET
BASOS PCT: 1 %
Basophils Absolute: 0 10*3/uL (ref 0–0.1)
Eosinophils Absolute: 0.1 10*3/uL (ref 0–0.7)
Eosinophils Relative: 2 %
HEMATOCRIT: 34.3 % — AB (ref 35.0–47.0)
HEMOGLOBIN: 12 g/dL (ref 12.0–16.0)
LYMPHS ABS: 3.3 10*3/uL (ref 1.0–3.6)
Lymphocytes Relative: 51 %
MCH: 33.1 pg (ref 26.0–34.0)
MCHC: 35.1 g/dL (ref 32.0–36.0)
MCV: 94.5 fL (ref 80.0–100.0)
MONO ABS: 0.3 10*3/uL (ref 0.2–0.9)
MONOS PCT: 5 %
NEUTROS ABS: 2.6 10*3/uL (ref 1.4–6.5)
NEUTROS PCT: 41 %
Platelets: 281 10*3/uL (ref 150–440)
RBC: 3.63 MIL/uL — ABNORMAL LOW (ref 3.80–5.20)
RDW: 12.8 % (ref 11.5–14.5)
WBC: 6.4 10*3/uL (ref 3.6–11.0)

## 2016-09-20 LAB — TYPE AND SCREEN
ABO/RH(D): A POS
ANTIBODY SCREEN: NEGATIVE

## 2016-09-20 NOTE — Patient Instructions (Signed)
  Your procedure is scheduled on: 09/24/16 Mon Report to Same Day Surgery 2nd floor medical mall To find out your arrival time please call [phone] between 1PM - 3PM on 09/21/16 Fri Remember: Instructions that are not followed completely may result in serious medical risk, up to and including death, or upon the discretion of your surgeon and anesthesiologist your surgery may need to be rescheduled.    _x___ 1. Do not eat food or drink liquids after midnight. No gum chewing or hard candies.     __x__ 2. No Alcohol for 24 hours before or after surgery.   __x__3. No Smoking for 24 prior to surgery.   ____  4. Bring all medications with you on the day of surgery if instructed.    __x__ 5. Notify your doctor if there is any change in your medical condition     (cold, fever, infections).     Do not wear jewelry, make-up, hairpins, clips or nail polish.  Do not wear lotions, powders, or perfumes. You may wear deodorant.  Do not shave 48 hours prior to surgery. Men may shave face and neck.  Do not bring valuables to the hospital.    [facility] is not responsible for any belongings or valuables.               Contacts, dentures or bridgework may not be worn into surgery.  Leave your suitcase in the car. After surgery it may be brought to your room.  For patients admitted to the hospital, discharge time is determined by your treatment team.   Patients discharged the day of surgery will not be allowed to drive home.    Please read over the following fact sheets that you were given:   [facility] Preparing for Surgery and or MRSA Information   _x___ Take these medicines the morning of surgery with A SIP OF WATER:    1. metoprolol (LOPRESSOR) 50 MG tablet  2.HYDROcodone-acetaminophen (NORCO) 5-325 MG tablet  3.  4.  5.  6.  ____Fleets enema or Magnesium Citrate as directed.   _x___ Use CHG Soap or sage wipes as directed on instruction sheet   ____ Use inhalers on the day of  surgery and bring to hospital day of surgery  ____ Stop metformin 2 days prior to surgery    ____ Take 1/2 of usual insulin dose the night before surgery and none on the morning of           surgery.   ____ Stop aspirin or coumadin, or plavix  x__ Stop Anti-inflammatories such as Advil, Aleve, Ibuprofen, Motrin, Naproxen,          Naprosyn, Goodies powders or aspirin products. Ok to take Tylenol.   ____ Stop supplements until after surgery.    ____ Bring C-Pap to the hospital.

## 2016-09-21 LAB — RPR: RPR: NONREACTIVE

## 2016-09-24 ENCOUNTER — Ambulatory Visit: Payer: 59 | Admitting: Anesthesiology

## 2016-09-24 ENCOUNTER — Ambulatory Visit
Admission: RE | Admit: 2016-09-24 | Discharge: 2016-09-24 | Disposition: A | Discharge status: Home / Self Care | Payer: 59 | Source: Ambulatory Visit | Attending: Obstetrics and Gynecology | Admitting: Obstetrics and Gynecology

## 2016-09-24 ENCOUNTER — Encounter
Admission: RE | Disposition: A | Discharge status: Home / Self Care | Payer: Self-pay | Source: Ambulatory Visit | Attending: Obstetrics and Gynecology

## 2016-09-24 DIAGNOSIS — Z91012 Allergy to eggs: Secondary | ICD-10-CM | POA: Diagnosis not present

## 2016-09-24 DIAGNOSIS — N83209 Unspecified ovarian cyst, unspecified side: Secondary | ICD-10-CM | POA: Diagnosis not present

## 2016-09-24 DIAGNOSIS — N83291 Other ovarian cyst, right side: Secondary | ICD-10-CM | POA: Diagnosis not present

## 2016-09-24 DIAGNOSIS — I4891 Unspecified atrial fibrillation: Secondary | ICD-10-CM | POA: Insufficient documentation

## 2016-09-24 DIAGNOSIS — I340 Nonrheumatic mitral (valve) insufficiency: Secondary | ICD-10-CM | POA: Diagnosis not present

## 2016-09-24 DIAGNOSIS — N946 Dysmenorrhea, unspecified: Secondary | ICD-10-CM | POA: Insufficient documentation

## 2016-09-24 DIAGNOSIS — Z859 Personal history of malignant neoplasm, unspecified: Secondary | ICD-10-CM | POA: Insufficient documentation

## 2016-09-24 DIAGNOSIS — Z8379 Family history of other diseases of the digestive system: Secondary | ICD-10-CM | POA: Diagnosis not present

## 2016-09-24 DIAGNOSIS — Z8041 Family history of malignant neoplasm of ovary: Secondary | ICD-10-CM | POA: Insufficient documentation

## 2016-09-24 DIAGNOSIS — R102 Pelvic and perineal pain: Secondary | ICD-10-CM | POA: Diagnosis not present

## 2016-09-24 DIAGNOSIS — N801 Endometriosis of ovary: Secondary | ICD-10-CM | POA: Insufficient documentation

## 2016-09-24 DIAGNOSIS — Z888 Allergy status to other drugs, medicaments and biological substances status: Secondary | ICD-10-CM | POA: Insufficient documentation

## 2016-09-24 DIAGNOSIS — N938 Other specified abnormal uterine and vaginal bleeding: Secondary | ICD-10-CM | POA: Insufficient documentation

## 2016-09-24 DIAGNOSIS — Z800 Family history of malignant neoplasm of digestive organs: Secondary | ICD-10-CM | POA: Diagnosis not present

## 2016-09-24 DIAGNOSIS — N83292 Other ovarian cyst, left side: Secondary | ICD-10-CM | POA: Diagnosis not present

## 2016-09-24 DIAGNOSIS — F17210 Nicotine dependence, cigarettes, uncomplicated: Secondary | ICD-10-CM | POA: Insufficient documentation

## 2016-09-24 DIAGNOSIS — N939 Abnormal uterine and vaginal bleeding, unspecified: Secondary | ICD-10-CM | POA: Diagnosis not present

## 2016-09-24 DIAGNOSIS — N83201 Unspecified ovarian cyst, right side: Secondary | ICD-10-CM

## 2016-09-24 DIAGNOSIS — N803 Endometriosis of pelvic peritoneum: Secondary | ICD-10-CM | POA: Insufficient documentation

## 2016-09-24 DIAGNOSIS — Z91040 Latex allergy status: Secondary | ICD-10-CM | POA: Insufficient documentation

## 2016-09-24 DIAGNOSIS — Z833 Family history of diabetes mellitus: Secondary | ICD-10-CM | POA: Insufficient documentation

## 2016-09-24 DIAGNOSIS — N736 Female pelvic peritoneal adhesions (postinfective): Secondary | ICD-10-CM | POA: Diagnosis not present

## 2016-09-24 DIAGNOSIS — N838 Other noninflammatory disorders of ovary, fallopian tube and broad ligament: Secondary | ICD-10-CM | POA: Diagnosis not present

## 2016-09-24 DIAGNOSIS — N809 Endometriosis, unspecified: Secondary | ICD-10-CM | POA: Diagnosis not present

## 2016-09-24 DIAGNOSIS — Z9889 Other specified postprocedural states: Secondary | ICD-10-CM

## 2016-09-24 HISTORY — PX: LAPAROSCOPIC OVARIAN CYSTECTOMY: SHX6248

## 2016-09-24 LAB — POCT PREGNANCY, URINE: Preg Test, Ur: NEGATIVE

## 2016-09-24 LAB — ABO/RH: ABO/RH(D): A POS

## 2016-09-24 SURGERY — EXCISION, CYST, OVARY, LAPAROSCOPIC
Anesthesia: General | Site: Abdomen | Laterality: Right | Wound class: Clean Contaminated

## 2016-09-24 MED ORDER — ONDANSETRON HCL 4 MG/2ML IJ SOLN: 4.0000 mg | Freq: Once | INTRAMUSCULAR | Status: DC | PRN

## 2016-09-24 MED ORDER — EPHEDRINE SULFATE 50 MG/ML IJ SOLN
INTRAMUSCULAR | Status: DC | PRN
  Administered 2016-09-24: 10 mg via INTRAVENOUS
  Administered 2016-09-24: 5 mg via INTRAVENOUS
  Administered 2016-09-24: 7.5 mg via INTRAVENOUS

## 2016-09-24 MED ORDER — MIDAZOLAM HCL 2 MG/2ML IJ SOLN
INTRAMUSCULAR | Status: DC | PRN
  Administered 2016-09-24: 2 mg via INTRAVENOUS

## 2016-09-24 MED ORDER — OXYCODONE-ACETAMINOPHEN 5-325 MG PO TABS
1.0000 | ORAL_TABLET | ORAL | Status: DC | PRN
  Administered 2016-09-24: 1 via ORAL

## 2016-09-24 MED ORDER — PHENYLEPHRINE HCL 10 MG/ML IJ SOLN
INTRAMUSCULAR | Status: DC | PRN
  Administered 2016-09-24: 200 ug via INTRAVENOUS

## 2016-09-24 MED ORDER — FAMOTIDINE 20 MG PO TABS
ORAL_TABLET | ORAL | Status: AC
  Administered 2016-09-24: 20 mg via ORAL
  Filled 2016-09-24: qty 1

## 2016-09-24 MED ORDER — FENTANYL CITRATE (PF) 100 MCG/2ML IJ SOLN
INTRAMUSCULAR | Status: AC
  Administered 2016-09-24: 25 ug via INTRAVENOUS
  Filled 2016-09-24: qty 2

## 2016-09-24 MED ORDER — ROCURONIUM BROMIDE 100 MG/10ML IV SOLN
INTRAVENOUS | Status: DC | PRN
  Administered 2016-09-24: 15 mg via INTRAVENOUS
  Administered 2016-09-24: 5 mg via INTRAVENOUS
  Administered 2016-09-24 (×2): 15 mg via INTRAVENOUS

## 2016-09-24 MED ORDER — LACTATED RINGERS IV SOLN: INTRAVENOUS | Status: DC

## 2016-09-24 MED ORDER — SUGAMMADEX SODIUM 200 MG/2ML IV SOLN
INTRAVENOUS | Status: DC | PRN
  Administered 2016-09-24: 200 mg via INTRAVENOUS

## 2016-09-24 MED ORDER — FENTANYL CITRATE (PF) 100 MCG/2ML IJ SOLN
INTRAMUSCULAR | Status: AC
  Filled 2016-09-24: qty 2

## 2016-09-24 MED ORDER — PROPOFOL 10 MG/ML IV BOLUS
INTRAVENOUS | Status: DC | PRN
  Administered 2016-09-24: 150 mg via INTRAVENOUS

## 2016-09-24 MED ORDER — SUCCINYLCHOLINE CHLORIDE 20 MG/ML IJ SOLN
INTRAMUSCULAR | Status: DC | PRN
  Administered 2016-09-24: 90 mg via INTRAVENOUS

## 2016-09-24 MED ORDER — OXYCODONE-ACETAMINOPHEN 5-325 MG PO TABS
ORAL_TABLET | ORAL | Status: AC
  Filled 2016-09-24: qty 1

## 2016-09-24 MED ORDER — FENTANYL CITRATE (PF) 100 MCG/2ML IJ SOLN
INTRAMUSCULAR | Status: DC | PRN
  Administered 2016-09-24 (×2): 25 ug via INTRAVENOUS
  Administered 2016-09-24: 100 ug via INTRAVENOUS
  Administered 2016-09-24 (×2): 25 ug via INTRAVENOUS
  Administered 2016-09-24: 50 ug via INTRAVENOUS

## 2016-09-24 MED ORDER — BUPIVACAINE-EPINEPHRINE (PF) 0.5% -1:200000 IJ SOLN
INTRAMUSCULAR | Status: AC
  Filled 2016-09-24: qty 30

## 2016-09-24 MED ORDER — FENTANYL CITRATE (PF) 100 MCG/2ML IJ SOLN
25.0000 ug | INTRAMUSCULAR | Status: AC | PRN
  Administered 2016-09-24 (×6): 25 ug via INTRAVENOUS

## 2016-09-24 MED ORDER — DEXAMETHASONE SODIUM PHOSPHATE 10 MG/ML IJ SOLN
INTRAMUSCULAR | Status: DC | PRN
  Administered 2016-09-24: 5 mg via INTRAVENOUS

## 2016-09-24 MED ORDER — ONDANSETRON HCL 4 MG/2ML IJ SOLN
INTRAMUSCULAR | Status: DC | PRN
  Administered 2016-09-24: 4 mg via INTRAVENOUS

## 2016-09-24 MED ORDER — FAMOTIDINE 20 MG PO TABS
20.0000 mg | ORAL_TABLET | Freq: Once | ORAL | Status: AC
  Administered 2016-09-24: 20 mg via ORAL

## 2016-09-24 MED ORDER — OXYCODONE-ACETAMINOPHEN 5-325 MG PO TABS: 1.0000 | ORAL_TABLET | ORAL | 0 refills | Status: DC | PRN

## 2016-09-24 MED ORDER — LACTATED RINGERS IV SOLN
INTRAVENOUS | Status: DC
  Administered 2016-09-24 (×2): via INTRAVENOUS

## 2016-09-24 SURGICAL SUPPLY — 36 items
ADH SKN CLS APL DERMABOND .7 (GAUZE/BANDAGES/DRESSINGS) ×1
BLADE SURG SZ11 CARB STEEL (BLADE) ×2 IMPLANT
CANISTER SUCT 1200ML W/VALVE (MISCELLANEOUS) ×2 IMPLANT
CATH ROBINSON RED A/P 16FR (CATHETERS) ×2 IMPLANT
CHLORAPREP W/TINT 26ML (MISCELLANEOUS) ×2 IMPLANT
DERMABOND ADVANCED (GAUZE/BANDAGES/DRESSINGS) ×1
DERMABOND ADVANCED .7 DNX12 (GAUZE/BANDAGES/DRESSINGS) ×1 IMPLANT
DRSG TELFA 3X8 NADH (GAUZE/BANDAGES/DRESSINGS) ×2 IMPLANT
GLOVE BIO SURGEON STRL SZ8 (GLOVE) ×2 IMPLANT
GLOVE INDICATOR 8.0 STRL GRN (GLOVE) ×2 IMPLANT
GOWN STRL REUS W/ TWL LRG LVL3 (GOWN DISPOSABLE) ×1 IMPLANT
GOWN STRL REUS W/ TWL XL LVL3 (GOWN DISPOSABLE) ×1 IMPLANT
GOWN STRL REUS W/TWL LRG LVL3 (GOWN DISPOSABLE) ×2
GOWN STRL REUS W/TWL XL LVL3 (GOWN DISPOSABLE) ×2
IRRIGATION STRYKERFLOW (MISCELLANEOUS) ×1 IMPLANT
IRRIGATOR STRYKERFLOW (MISCELLANEOUS) ×2
IV LACTATED RINGERS 1000ML (IV SOLUTION) ×1 IMPLANT
KIT PINK PAD W/HEAD ARE REST (MISCELLANEOUS) ×2
KIT PINK PAD W/HEAD ARM REST (MISCELLANEOUS) ×1 IMPLANT
KIT RM TURNOVER CYSTO AR (KITS) ×2 IMPLANT
LABEL OR SOLS (LABEL) IMPLANT
NS IRRIG 1000ML POUR BTL (IV SOLUTION) ×2 IMPLANT
NS IRRIG 500ML POUR BTL (IV SOLUTION) ×2 IMPLANT
PACK GYN LAPAROSCOPIC (MISCELLANEOUS) ×2 IMPLANT
PAD DRESSING TELFA 3X8 NADH (GAUZE/BANDAGES/DRESSINGS) IMPLANT
PAD OB MATERNITY 4.3X12.25 (PERSONAL CARE ITEMS) ×2 IMPLANT
PAD PREP 24X41 OB/GYN DISP (PERSONAL CARE ITEMS) ×2 IMPLANT
POUCH ENDO CATCH 10MM SPEC (MISCELLANEOUS) IMPLANT
SCISSORS METZENBAUM CVD 33 (INSTRUMENTS) ×1 IMPLANT
SHEARS HARMONIC ACE PLUS 36CM (ENDOMECHANICALS) ×1 IMPLANT
SLEEVE ENDOPATH XCEL 5M (ENDOMECHANICALS) ×4 IMPLANT
SUT PLAIN 4 0 FS 2 27 (SUTURE) ×2 IMPLANT
SUT VIC AB 0 CT2 27 (SUTURE) ×2 IMPLANT
SUT VIC AB 0 UR5 27 (SUTURE) ×2 IMPLANT
TROCAR XCEL NON-BLD 5MMX100MML (ENDOMECHANICALS) ×2 IMPLANT
TUBING INSUFFLATOR HI FLOW (MISCELLANEOUS) ×2 IMPLANT

## 2016-09-24 NOTE — H&P (View-Only) (Signed)
Subjective:    Patient is a 35 y.o. G2P1057female scheduled for Laparoscopy with right ovarian cystectomy and peritoneal biopsies on 09/24/2016.  Recent history of new onset acute LV pain 1 week ago. Pain began in lower back with subsequent radiation to the front and then right lower quadrant. Pain was severe enough to prevent patient from getting out of bed and was associated with fatigue and lightheadedness. Emergency room evaluation demonstrated a 4.4 cm right ovarian cyst without evidence of torsion. A 2.2 cm left ovarian cyst was also seen. Patient has been using Norco for pain management; pain is currently characterized as 7/10 in intensity. The pain is sharp, stabbing, and now constant. The pain is made worse with movement especially going from sitting to standing positions.  In association with this onset of pain, the patient developed abnormal uterine bleeding. She has been on long-term Depo-Provera therapy with amenorrhea state until the last week. Patient does have history of dysmenorrhea in the past.    Pertinent Gynecological History: Menses: New onset abnormal bleeding on Depo-Provera Bleeding: dysfunctional uterine bleeding Contraception: Depo-Provera injections Last mammogram: N/A Date: N/A Last pap: Unknown     Menstrual History: OB History    Gravida Para Term Preterm AB Living   2 1 1     1    SAB TAB Ectopic Multiple Live Births           1      Menarche age: na  No LMP recorded. Patient has had an injection.    Past Medical History:  Diagnosis Date  . A-fib ([place])   . Cancer ([place])   . Diverticulosis   . Gallstones   . Headache   . Kidney stones   . Mitral regurgitation    Mild  . Ovarian cyst 09/16/2016   right measuring 4.4 cm  . Pneumonia   . Tricuspid regurgitation    Trace    Past Surgical History:  Procedure Laterality Date  . CESAREAN SECTION    . CHOLECYSTECTOMY    . GALLBLADDER SURGERY    . TONSILLECTOMY      OB History  Gravida Para  Term Preterm AB Living  2 1 1     1   SAB TAB Ectopic Multiple Live Births          1    # Outcome Date GA Lbr Len/2nd Weight Sex Delivery Anes PTL Lv  2 Term 2012   4 lb 1.8 oz (1.864 kg) F CS-LTranv   LIV  1 Gravida               Social History   Social History  . Marital status: Single    Spouse name: N/A  . Number of children: N/A  . Years of education: N/A   Occupational History  . therapist [employer]   Social History Main Topics  . Smoking status: Current Some Day Smoker    Packs/day: 0.50    Types: Cigarettes  . Smokeless tobacco: Never Used     Comment: smokes "once or twice a week"  . Alcohol use No  . Drug use: No  . Sexual activity: Yes    Birth control/ protection: Injection   Other Topics Concern  . Not on file   Social History Narrative  . No narrative on file    Family History  Problem Relation Age of Onset  . Colon cancer Paternal Grandmother   . Diabetes Paternal Grandmother   . Ulcerative colitis    .  Kidney disease    . Stomach cancer Maternal Grandfather   . Diabetes Mother   . Diabetes Father   . Ovarian cancer Maternal Grandmother   . Breast cancer Neg Hx      (Not in a hospital admission)  Allergies  Allergen Reactions  . Lyrica [Pregabalin] Swelling  . Eggs Or Egg-Derived Products Rash and Swelling  . Latex Rash    Review of Systems Constitutional: No recent fever/chills/sweats Respiratory: No recent cough/bronchitis Cardiovascular: No chest pain Gastrointestinal: No recent nausea/vomiting/diarrhea Genitourinary: No UTI symptoms Hematologic/lymphatic:No history of coagulopathy or recent blood thinner use    Objective:    BP 104/71   Pulse 87   Ht 5\' 5"  (1.651 m)   Wt 218 lb 6.4 oz (99.1 kg)   BMI 36.34 kg/m   General:   Normal  Skin:   normal  HEENT:  Normal  Neck:  Supple without Adenopathy or Thyromegaly  Lungs:   Heart:              Breasts:   Abdomen:  Pelvis:  M/S   Extremeties:   Neuro:    clear to auscultation bilaterally   Normal without murmur   Not Examined   soft, non-tender; bowel sounds normal; no masses,  no organomegaly   Exam deferred to OR  No CVAT  Warm/Dry   Normal       09/19/2016 Pelvic: External genitalia-normal BUS-normal Vagina-menstrual blood in the vault Cervix-no gross lesions; 1/4 cervical motion tenderness Uterus-midplane, mobile, tender 2/4 Normal size and shape Adnexa-bilateral tenderness 2/4 without palpable mass Rectovaginal-normal external exam Assessment:    1. 4.4 cm right ovarian cyst 2. Acute onset pelvic pain 3. New onset abnormal uterine bleeding on Depo-Provera therapy Plan:  Laparoscopy with right ovarian cystectomy and peritoneal biopsies  Preop counseling: Patient is to undergo laparoscopy with right ovarian cystectomy and peritoneal biopsies on 09/24/2016. She is understanding of the planned procedure and is aware of and is accepting of all surgical risks which include but are not limited to bleeding, infection, pelvic organ injury with need for repair, blood clot disorders, anesthesia risks, etc. All questions have been answered. Informed consent is given. Patient is ready and willing to proceed with surgery as scheduled.  Brayton Mars, MD  Note: This dictation was prepared with Dragon dictation along with smaller phrase technology. Any transcriptional errors that result from this process are unintentional.

## 2016-09-24 NOTE — Op Note (Signed)
OPERATIVE NOTE:  CEOLA HUGLEY PROCEDURE DATE: 09/24/2016    PREOPERATIVE DIAGNOSIS:  1. Right ovarian cyst, 4.4 cm 2. Abnormal uterine bleeding 3. Pelvic pain, right lower quadrant  POSTOPERATIVE DIAGNOSIS:  1. Right ovarian cyst, 4.4 cm 2. Abnormal uterine bleeding 3. Pelvic pain, right lower quadrant 4. Pelvic adhesive disease 5. Endometriosis  PROCEDURE:  1. Laparoscopy with right ovarian cystectomy, adhesiolysis, and excision and fulguration of endometriosis   SURGEON:  Brayton Mars, MD ASSISTANTS: None ANESTHESIA: General INDICATIONS: 35 y.o. G2P1001  with newly identified right ovarian cyst, 4.4 cm, symptomatic, referred from the emergency room for further management. History of physical suspicious for symptomatic endometriosis   FINDINGS: Right adnexal adhesions involving bowel and right tube and ovary; right ovarian cyst, excised. Left simple ovarian cyst, managed with cystotomy and production of clear fluid Left tube and ovary normal Cul-de-sac pseudo-fenestrations consistent with endometriosis, excised and cauterized with Kleppinger bipolar forceps Left ovarian fossa powder burn implants consistent with endometriosis, excised and cauterized with Kleppinger bipolar forceps Normal appearing appendix Right upper quadrant scarring likely secondary from previous cholecystectomy    I/O's: Total I/O In: 1000 [I.V.:1000] Out: 100 [Urine:100] COUNTS:  YES SPECIMENS:  Multiple peritoneal biopsies including:  Cul-de-sac peritoneum  Right ovary  Right ovarian cyst  Left ovary  Left ovarian fossa peritoneum  ANTIBIOTIC PROPHYLAXIS:N/A COMPLICATIONS: None immediate  PROCEDURE IN DETAIL:Patient was brought to the operating room and placed in the supine position. General endotracheal anesthesia was induced without difficulty. She was placed in the dorsal lithotomy position using the bumblebee stirrups. A ChloraPrep and Hibiclens abdominal, perineal, intravaginal  prep and drape was performed in standard fashion. Timeout was completed. Silastic catheter was used to drain 100 cc of urine from the bladder. Hulka tenaculum was placed on the cervix to facilitate uterine manipulation. Subumbilical vertical incision 5 mm in length was made. The Optiview laparoscope trocar system was used to place a 5 mm port directly into the abdominal pelvic cavity without evidence of bowel or vascular injury. 2 other 5 mm ports were placed in the right and left lower quadrants respectively under direct visualization. Using graspers, Harmonic scalpel, and scissors, the adhesiolysis was performed to restore normal anatomy of the right adnexa. The right ovary was biopsied. This caused decompression of the ovarian cyst. The ovarian cyst wall was then excised using the graspers and Harmonic scalpel. The cul-de-sac pseudo-fenestrations localized on the right side were then biopsied with biopsy biopsy forceps, followed by cauterization with Kleppinger bipolar forceps. The left ovarian cyst was identified and drilled with the Harmonic scalpel producing 5 cc of clear serosanguineous fluid. The left ovarian fossa endometriosis implant was then excised with biopsy forceps followed by cauterization with Kleppinger bipolar forceps. Good hemostasis was obtained. The pelvis was copiously irrigated. All irrigant fluid was aspirated. Once satisfied with hemostasis and complete survey of the abdomen and pelvis, procedure was terminated with all instrumentation being removed from the abdominal pelvic cavity. Pneumoperitoneum was released. Incisions were then closed with Dermabond glue. Patient was then awakened mobilized and taken to recovery room in satisfactory condition. EBL-less than 10 cc   Hassell Done A. Zipporah Plants, MD, [facility]

## 2016-09-24 NOTE — Discharge Instructions (Signed)
AMBULATORY SURGERY  DISCHARGE INSTRUCTIONS   1) The drugs that you were given will stay in your system until tomorrow so for the next 24 hours you should not:  A) Drive an automobile B) Make any legal decisions C) Drink any alcoholic beverage   2) You may resume regular meals tomorrow.  Today it is better to start with liquids and gradually work up to solid foods.  You may eat anything you prefer, but it is better to start with liquids, then soup and crackers, and gradually work up to solid foods.   3) Please notify your doctor immediately if you have any unusual bleeding, trouble breathing, redness and pain at the surgery site, drainage, fever, or pain not relieved by medication.    4) Additional Instructions:        Please contact your physician with any problems or Same Day Surgery at [phone], Monday through Friday 6 am to 4 pm, or [place] at [facility] number at [phone].  Laparoscopic Lysis of Abdominal Adhesions, Care After Refer to this sheet in the next few weeks. These instructions provide you with information about caring for yourself after your procedure. Your health care provider may also give you more specific instructions. Your treatment has been planned according to current medical practices, but problems sometimes occur. Call your health care provider if you have any problems or questions after your procedure. WHAT TO EXPECT AFTER THE PROCEDURE After your procedure, it is common to have some pain around the incision. HOME CARE INSTRUCTIONS  Take medicines only as directed by your health care provider.  You may need to start by eating only a little at a time. Start with liquids. As your appetite improves, gradually return to eating solid foods.  Do not lift anything that is heavier than 10 lb (4.5 kg) for four weeks.  Return to your normal activities as directed by your health care provider. Ask your health care provider what activities are  safe for you.  There are many different ways to close and cover an incision, including stitches (sutures), skin glue, and adhesive strips. Follow instructions from your health care provider about:  Incision care.  Bandage (dressing) changes and removal.  Incision closure removal.  Check your incisions every day for signs of infection. Watch for:  Redness, swelling, or pain.  Fluid , blood, or pus. SEEK MEDICAL CARE IF:  You develop a fever or chills.  You have redness, swelling, or pain at the site of your incisions.  You have fluid, blood, or pus coming from your incisions.  There is a bad smell coming from your incisions or the dressing.  Your pain gets worse.  You have a cough.  You have nausea and vomiting that does not go away after 3 hours. SEEK IMMEDIATE MEDICAL CARE IF:  You develop severe pain in your abdomen or your chest.  You develop shortness of breath.  You develop nausea or vomiting that is severe or keeps coming back.   This information is not intended to replace advice given to you by your health care provider. Make sure you discuss any questions you have with your health care provider.   Document Released: 03/22/2015 Document Reviewed: 03/22/2015 Elsevier Interactive Patient Education [employer].   Endometriosis Endometriosis is a condition in which the tissue that lines the uterus (endometrium) grows outside of its normal location. The tissue may grow in many locations close to the uterus, but it commonly grows on the ovaries, fallopian  tubes, vagina, or bowel. Because the uterus expels, or sheds, its lining every menstrual cycle, there is bleeding wherever the endometrial tissue is located. This can cause pain because blood is irritating to tissues not normally exposed to it.  CAUSES  The cause of endometriosis is not known.  SIGNS AND SYMPTOMS  Often, there are no symptoms. When symptoms are present, they can vary with the location of the  displaced tissue. Various symptoms can occur at different times. Although symptoms occur mainly during a woman's menstrual period, they can also occur midcycle and usually stop with menopause. Some people may go months with no symptoms at all. Symptoms may include:   Back or abdominal pain.   Heavier bleeding during periods.   Pain during intercourse.   Painful bowel movements.   Infertility. DIAGNOSIS  Your health care provider will do a physical exam and ask about your symptoms. Various tests may be done, such as:   Blood tests and urine tests. These are done to help rule out other problems.   Ultrasound. This test is done to look for abnormal tissue.   An X-ray of the lower bowel (barium enema).  Laparoscopy. In this procedure, a thin, lighted tube with a tiny camera on the end (laparoscope) is inserted into your abdomen. This helps your health care provider look for abnormal tissue to confirm the diagnosis. The health care provider may also remove a small piece of tissue (biopsy) from any abnormal tissue found. This tissue sample can then be sent to a lab so it can be looked at under a microscope. TREATMENT  Treatment will vary and may include:   Medicines to relieve pain. Nonsteroidal anti-inflammatory drugs (NSAIDs) are a type of pain medicine that can help to relieve the pain caused by endometriosis.  Hormonal therapy. When using hormonal therapy, periods are eliminated. This eliminates the monthly exposure to blood by the displaced endometrial tissue.   Surgery. Surgery may sometimes be done to remove the abnormal endometrial tissue. In severe cases, surgery may be done to remove the fallopian tubes, uterus, and ovaries (hysterectomy). HOME CARE INSTRUCTIONS   Take all medicines as directed by your health care provider. Do not take aspirin because it may increase bleeding when you are not on hormonal therapy.   Avoid activities that produce pain, including sexual  activity. SEEK MEDICAL CARE IF:  You have pelvic pain before, after, or during your periods.  You have pelvic pain between periods that gets worse during your period.  You have pelvic pain during or after sex.  You have pelvic pain with bowel movements or urination, especially during your period.  You have problems getting pregnant.  You have a fever. SEEK IMMEDIATE MEDICAL CARE IF:   Your pain is severe and is not responding to pain medicine.   You have severe nausea and vomiting, or you cannot keep foods down.   You have pain that is limited to the right lower part of your abdomen.   You have swelling or increasing pain in your abdomen.   You see blood in your stool.  MAKE SURE YOU:   Understand these instructions.  Will watch your condition.  Will get help right away if you are not doing well or get worse.   This information is not intended to replace advice given to you by your health care provider. Make sure you discuss any questions you have with your health care provider.   Document Released: 11/02/2000 Document Revised: 11/26/2014 Document Reviewed: 07/03/2013  Chartered certified accountant Patient Education [employer].

## 2016-09-24 NOTE — Anesthesia Preprocedure Evaluation (Addendum)
Anesthesia Evaluation  Patient identified by MRN, date of birth, ID band Patient awake    Reviewed: Allergy & Precautions, NPO status , Patient's Chart, lab work & pertinent test results, reviewed documented beta blocker date and time   Airway Mallampati: III  TM Distance: >3 FB     Dental  (+) Chipped   Pulmonary neg sleep apnea, pneumonia, resolved, Current Smoker,           Cardiovascular      Neuro/Psych  Headaches,    GI/Hepatic   Endo/Other    Renal/GU Renal InsufficiencyRenal disease     Musculoskeletal   Abdominal   Peds  Hematology   Anesthesia Other Findings Hx of tachycardia. EKG ok. Not really allergic to eggs as she can eat cakes, pastries etc. Occasional fast heart beats responds well to B-blockers. U-preg negative.  Reproductive/Obstetrics                           Anesthesia Physical Anesthesia Plan  ASA: III  Anesthesia Plan: General   Post-op Pain Management:    Induction: Intravenous  Airway Management Planned: Oral ETT  Additional Equipment:   Intra-op Plan:   Post-operative Plan:   Informed Consent: I have reviewed the patients History and Physical, chart, labs and discussed the procedure including the risks, benefits and alternatives for the proposed anesthesia with the patient or authorized representative who has indicated his/her understanding and acceptance.     Plan Discussed with: CRNA  Anesthesia Plan Comments:         Anesthesia Quick Evaluation

## 2016-09-24 NOTE — Progress Notes (Signed)
Pt states pain is now a 3 and wishes to go home

## 2016-09-24 NOTE — Progress Notes (Signed)
Pt up to bathroom to void   Small amount drainage noted on peri pad

## 2016-09-24 NOTE — Anesthesia Procedure Notes (Signed)
Procedure Name: Intubation Date/Time: 09/24/2016 12:46 PM Performed by: Kennon Holter Pre-anesthesia Checklist: Patient identified, Patient being monitored, Timeout performed, Emergency Drugs available and Suction available Patient Re-evaluated:Patient Re-evaluated prior to inductionOxygen Delivery Method: Circle system utilized Preoxygenation: Pre-oxygenation with 100% oxygen Intubation Type: IV induction Ventilation: Mask ventilation without difficulty Laryngoscope Size: Mac and 3 Grade View: Grade I Tube type: Oral Tube size: 7.0 mm Number of attempts: 1 Airway Equipment and Method: Stylet Placement Confirmation: ETT inserted through vocal cords under direct vision,  positive ETCO2 and breath sounds checked- equal and bilateral Secured at: 20 cm Tube secured with: Tape Dental Injury: Teeth and Oropharynx as per pre-operative assessment

## 2016-09-24 NOTE — Transfer of Care (Signed)
Immediate Anesthesia Transfer of Care Note  Patient: Katherine Murray  Procedure(s) Performed: Procedure(s): LAPAROSCOPIC RIGHT OVARIAN CYSTECTOMY, Excision of ovarian cyst, Excision of endometeriosis, (Right)  Patient Location: PACU  Anesthesia Type:General  Level of Consciousness: awake, alert  and oriented  Airway & Oxygen Therapy: Patient Spontanous Breathing and Patient connected to face mask oxygen  Post-op Assessment: Report given to RN, Post -op Vital signs reviewed and stable and Patient moving all extremities X 4  Post vital signs: Reviewed and stable  Last Vitals:  Vitals:   09/24/16 1118  BP: 117/78  Pulse: 72  Resp: 16  Temp: 36.4 C    Last Pain:  Vitals:   09/24/16 1118  TempSrc: Tympanic  PainSc: 8          Complications: No apparent anesthesia complications

## 2016-09-24 NOTE — Interval H&P Note (Signed)
History and Physical Interval Note:  09/24/2016 11:53 AM  Katherine Murray  has presented today for surgery, with the diagnosis of PELVIC PAIN, CYST OF RIGHT OVARY, ABNORMAL UTERINE BLEEDING  The various methods of treatment have been discussed with the patient and family. After consideration of risks, benefits and other options for treatment, the patient has consented to  Procedure(s): LAPAROSCOPIC RIGHT OVARIAN CYSTECTOMY (Right) and Peritoneal Biopsies as a surgical intervention .  The patient's history has been reviewed, patient examined, no change in status, stable for surgery.  I have reviewed the patient's chart and labs.  Questions were answered to the patient's satisfaction.     Hassell Done A Defrancesco

## 2016-09-25 NOTE — Anesthesia Postprocedure Evaluation (Signed)
Anesthesia Post Note  Patient: Katherine Murray  Procedure(s) Performed: Procedure(s) (LRB): LAPAROSCOPIC RIGHT OVARIAN CYSTECTOMY, Excision of ovarian cyst, Excision of endometeriosis, (Right)  Patient location during evaluation: PACU Anesthesia Type: General Level of consciousness: awake and alert Pain management: pain level controlled Vital Signs Assessment: post-procedure vital signs reviewed and stable Respiratory status: spontaneous breathing, nonlabored ventilation, respiratory function stable and patient connected to nasal cannula oxygen Cardiovascular status: blood pressure returned to baseline and stable Postop Assessment: no signs of nausea or vomiting Anesthetic complications: no    Last Vitals:  Vitals:   09/24/16 1513 09/24/16 1527  BP: 102/79 117/61  Pulse: 79 85  Resp: 16 16  Temp: 36.5 C 36.2 C    Last Pain:  Vitals:   09/24/16 1600  TempSrc:   PainSc: [place]

## 2016-09-26 ENCOUNTER — Encounter: Payer: Self-pay | Admitting: Obstetrics and Gynecology

## 2016-09-26 ENCOUNTER — Telehealth: Payer: Self-pay | Admitting: Obstetrics and Gynecology

## 2016-09-26 ENCOUNTER — Ambulatory Visit (INDEPENDENT_AMBULATORY_CARE_PROVIDER_SITE_OTHER): Payer: 59 | Admitting: Obstetrics and Gynecology

## 2016-09-26 VITALS — BP 103/68 | HR 89 | Temp 98.2°F | Wt 220.7 lb

## 2016-09-26 DIAGNOSIS — Z9889 Other specified postprocedural states: Secondary | ICD-10-CM

## 2016-09-26 DIAGNOSIS — Z09 Encounter for follow-up examination after completed treatment for conditions other than malignant neoplasm: Secondary | ICD-10-CM

## 2016-09-26 LAB — SURGICAL PATHOLOGY

## 2016-09-26 NOTE — Patient Instructions (Signed)
1. Keep incision was clean and dry 2. Follow-up as needed if incisional bleeding occurs or if incisional pain worsens 3. Keep regular postop appointment as scheduled next Tuesday

## 2016-09-26 NOTE — Progress Notes (Signed)
Chief complaint: 1. Incisional bleeding  Patient presents for incision check 2 days after laparoscopy. Patient developed incisional bleeding last night. No fever chills or sweats. Concern was for possible incisional infection.  Pathology from surgery is pending  OBJECTIVE: BP 103/68   Pulse 89   Temp 98.2 F (36.8 C) (Oral)   Wt 220 lb 11.2 oz (100.1 kg)   BMI 36.73 kg/m  Pleasant female in no acute distress Abdomen: Soft, nontender without peritoneal signs; laparoscopy incisions are well approximated and covered with Dermabond glue; there is. Incisional ecchymoses present without evidence of hematoma. No induration.  ASSESSMENT: 1. Postop incision check notable for. Incisional ecchymoses without hematoma or active vaginal bleeding  PLAN: 1. Reassurance given 2. Keep postop appointment as scheduled for next Tuesday 3. Return as needed if incisional bleeding recurs or if abdominal pain worsens.  Brayton Mars, MD  Note: This dictation was prepared with Dragon dictation along with smaller phrase technology. Any transcriptional errors that result from this process are unintentional.

## 2016-09-27 ENCOUNTER — Ambulatory Visit: Payer: 59

## 2016-09-27 ENCOUNTER — Encounter: Payer: Self-pay | Admitting: Obstetrics and Gynecology

## 2016-09-27 ENCOUNTER — Telehealth: Payer: Self-pay | Admitting: Obstetrics and Gynecology

## 2016-09-27 MED ORDER — OXYCODONE-ACETAMINOPHEN 5-325 MG PO TABS: 1.0000 | ORAL_TABLET | ORAL | 0 refills | Status: DC | PRN

## 2016-09-27 NOTE — Telephone Encounter (Signed)
Left message that rx has been left at front desk for pick up for oxycodone.

## 2016-09-27 NOTE — Telephone Encounter (Signed)
Katherine Murray said Dr. Tennis Must told her to call back and let him know if she needed a refill on her pain medication. She does needs refill (oxycodone) 5-325mg    1-2 every 4 hrs.

## 2016-09-28 ENCOUNTER — Telehealth: Payer: Self-pay | Admitting: Primary Care

## 2016-09-28 ENCOUNTER — Encounter: Payer: Self-pay | Admitting: Primary Care

## 2016-09-28 DIAGNOSIS — Z0000 Encounter for general adult medical examination without abnormal findings: Secondary | ICD-10-CM

## 2016-09-28 DIAGNOSIS — E559 Vitamin D deficiency, unspecified: Secondary | ICD-10-CM

## 2016-09-28 NOTE — Telephone Encounter (Signed)
Patient called.  Patient said she had a physical scheduled on Wednesday for a physical, but the appointment wasn't in [place].  I scheduled patient for a physical on 10/04/16.  Patient is asking for her lab orders to be put in the computer and she'll have them done at [facility] when she goes back for post op.

## 2016-09-28 NOTE — Telephone Encounter (Signed)
Noted  Labs ordered

## 2016-10-02 ENCOUNTER — Ambulatory Visit (INDEPENDENT_AMBULATORY_CARE_PROVIDER_SITE_OTHER): Payer: 59 | Admitting: Obstetrics and Gynecology

## 2016-10-02 ENCOUNTER — Other Ambulatory Visit
Admission: RE | Admit: 2016-10-02 | Discharge: 2016-10-02 | Disposition: A | Discharge status: Home / Self Care | Payer: 59 | Source: Ambulatory Visit | Attending: Internal Medicine | Admitting: Internal Medicine

## 2016-10-02 ENCOUNTER — Encounter: Payer: Self-pay | Admitting: Obstetrics and Gynecology

## 2016-10-02 VITALS — BP 109/73 | HR 73 | Ht 65.0 in | Wt 215.3 lb

## 2016-10-02 DIAGNOSIS — Z9889 Other specified postprocedural states: Secondary | ICD-10-CM

## 2016-10-02 DIAGNOSIS — E559 Vitamin D deficiency, unspecified: Secondary | ICD-10-CM | POA: Insufficient documentation

## 2016-10-02 DIAGNOSIS — N809 Endometriosis, unspecified: Secondary | ICD-10-CM

## 2016-10-02 DIAGNOSIS — R102 Pelvic and perineal pain: Secondary | ICD-10-CM

## 2016-10-02 DIAGNOSIS — Z09 Encounter for follow-up examination after completed treatment for conditions other than malignant neoplasm: Secondary | ICD-10-CM

## 2016-10-02 DIAGNOSIS — Z0000 Encounter for general adult medical examination without abnormal findings: Secondary | ICD-10-CM | POA: Insufficient documentation

## 2016-10-02 LAB — LIPID PANEL
CHOLESTEROL: 219 mg/dL — AB (ref 0–200)
HDL: 42 mg/dL (ref >40)
LDL Cholesterol: 127 mg/dL — ABNORMAL HIGH (ref 0–99)
TRIGLYCERIDES: 252 mg/dL — AB (ref <150)
Total CHOL/HDL Ratio: 5.2 RATIO
VLDL: 50 mg/dL — ABNORMAL HIGH (ref 0–40)

## 2016-10-02 LAB — TSH: TSH: 1.161 u[IU]/mL (ref 0.350–4.500)

## 2016-10-02 NOTE — Patient Instructions (Signed)
1. Patient to call regarding further follow-up and management of endometriosis after discussing options with family 2. Medical management options include:  Depo-Lupron  Danazol 3. Surgical options:  Hysterectomy with removal of one or both ovaries

## 2016-10-02 NOTE — Progress Notes (Signed)
Chief complaint: 1. One week postop check 2. Status post laparoscopy with right ovarian cystectomy, adhesiolysis, and excision and fulguration of endometriosis  Patient has done well since surgery. Bowel and bladder function is normal. She is currently using Percocet one tablet every 5 hours for pain. Incision sites are sore.  Findings from surgery were reviewed:  FINDINGS: Right adnexal adhesions involving bowel and right tube and ovary; right ovarian cyst, excised. Left simple ovarian cyst, managed with cystotomy and production of clear fluid Left tube and ovary normal Cul-de-sac pseudo-fenestrations consistent with endometriosis, excised and cauterized with Kleppinger bipolar forceps Left ovarian fossa powder burn implants consistent with endometriosis, excised and cauterized with Kleppinger bipolar forceps Normal appearing appendix Right upper quadrant scarring likely secondary from previous cholecystectomy   Biopsies from surgery were reviewed:  DIAGNOSIS:  A. CUL-DE-SAC; BIOPSY:  - ENDOMETRIOSIS.   B. RIGHT OVARY; BIOPSY:  - BENIGN EPITHELIAL INCLUSION CYST, ADHESIONS, AND REACTIVE CHANGES.   C. RIGHT OVARIAN CYST WALL; OVARIAN CYSTECTOMY:  - BENIGN EPITHELIAL INCLUSION CYST, ADHESIONS, AND REACTIVE CHANGES.   D. LEFT OVARY; BIOPSY:  - ADHESIONS, MESOTHELIAL LINED CYST, AND HEMORRHAGE.   E. LEFT OVARIAN FOSSA; BIOPSY:  - ENDOMETRIOSIS.    OBJECTIVE: BP 109/73   Pulse 73   Ht 5\' 5"  (1.651 m)   Wt 215 lb 4.8 oz (97.7 kg)   BMI 35.83 kg/m  Physical exam: Abdomen-soft, nontender; laparoscopy incision sites are well approximated. Minimal ecchymoses is noted.  ASSESSMENT: 1. Normal 1 week postop check status post laparoscopy with excision and fulguration of endometriosis, adhesiolysis, and right ovarian cystectomy  PLAN: 1. Patient is to call regarding further management of endometriosis after discussing options with family. 2. Options: Medical management-Depo-Lupron  or danazol versus Surgical management- hysterectomy with removal of one or both ovaries  Brayton Mars, MD  Note: This dictation was prepared with Dragon dictation along with smaller phrase technology. Any transcriptional errors that result from this process are unintentional.

## 2016-10-03 ENCOUNTER — Telehealth: Payer: Self-pay

## 2016-10-03 ENCOUNTER — Ambulatory Visit: Payer: 59

## 2016-10-03 ENCOUNTER — Encounter: Payer: Self-pay | Admitting: Obstetrics and Gynecology

## 2016-10-03 LAB — VITAMIN D 25 HYDROXY (VIT D DEFICIENCY, FRACTURES): VIT D 25 HYDROXY: 30.3 ng/mL (ref 30.0–100.0)

## 2016-10-03 MED ORDER — OXYCODONE-ACETAMINOPHEN 5-325 MG PO TABS: 1.0000 | ORAL_TABLET | ORAL | 0 refills | Status: DC | PRN

## 2016-10-03 NOTE — Telephone Encounter (Signed)
Pt is ready to proceed with surgery. Hysterectomy +/- bso vs. Uso. She would like her surgery in 11/2016. Will you please schedule. Pt aware she will get a phone call in 7-10 business days. Thanks.

## 2016-10-04 ENCOUNTER — Ambulatory Visit (INDEPENDENT_AMBULATORY_CARE_PROVIDER_SITE_OTHER): Payer: 59 | Admitting: Primary Care

## 2016-10-04 ENCOUNTER — Encounter: Payer: Self-pay | Admitting: Primary Care

## 2016-10-04 ENCOUNTER — Encounter: Payer: Self-pay | Admitting: Radiology

## 2016-10-04 VITALS — BP 110/76 | HR 83 | Temp 97.9°F | Ht 65.0 in | Wt 216.8 lb

## 2016-10-04 DIAGNOSIS — E782 Mixed hyperlipidemia: Secondary | ICD-10-CM | POA: Insufficient documentation

## 2016-10-04 DIAGNOSIS — Z0001 Encounter for general adult medical examination with abnormal findings: Secondary | ICD-10-CM

## 2016-10-04 DIAGNOSIS — G43901 Migraine, unspecified, not intractable, with status migrainosus: Secondary | ICD-10-CM

## 2016-10-04 DIAGNOSIS — Z0000 Encounter for general adult medical examination without abnormal findings: Secondary | ICD-10-CM | POA: Insufficient documentation

## 2016-10-04 DIAGNOSIS — E785 Hyperlipidemia, unspecified: Secondary | ICD-10-CM | POA: Insufficient documentation

## 2016-10-04 MED ORDER — BUTALBITAL-APAP-CAFFEINE 50-325-40 MG PO TABS: 1.0000 | ORAL_TABLET | Freq: Two times a day (BID) | ORAL | 0 refills | Status: DC | PRN

## 2016-10-04 NOTE — Patient Instructions (Signed)
Try Melatonin 5mg  tablets for difficulty sleeping. Take 1 tablet by mouth 1 hour prior to bedtime. You may increase up to 2 tablets if needed.  It's importance to improve your diet by reducing consumption of fast food, fried food, processed snack foods, sugary drinks. Increase consumption of fresh vegetables and fruits, whole grains, water.  Ensure you are drinking 64 ounces of water daily.  Start exercising. You should be getting 150 minutes of moderate intensity exercise weekly.  Stop by the lab to sign the contract and complete the urine drug screen.  Follow up in 1 year for repeat physical or sooner if needed.  It was a pleasure to see you today!

## 2016-10-04 NOTE — Progress Notes (Signed)
Subjective:    Patient ID: Katherine Murray, female    DOB: 22-May-1981, 35 y.o.   MRN: [id number]  HPI  Katherine Murray is a 35 year old female who presents today for complete physical.  Immunizations: -Tetanus: Completed in April 2017 -Influenza: Completed in October 2017   Diet: She endorses a healthy diet Breakfast: Skips, fruit Lunch: Grilled chicken, pork chops, green beans, left overs Dinner: Meat, vegetables, breads Snacks: Pretzels, veggies Desserts: None Beverages: Water  Exercise: She does not currently exercise. Eye exam: Due in November 2017 Dental exam: Completes annually Pap Smear: Completed in October 2017, normal.  1) Anxiety/Insomnia: Over the past 3 weeks she's been experiencing symptoms of anxiety, stress, worry, irritability, and difficulty sleeping. She cannot sleep at night as her mind races at bedtime. She also wakes up during the night several times with difficulty falling back asleep. She has no history of anxiety disorder or depression. She's not taken anything OTC for insomnia. She recently underwent surgery for right ovarian cyst and is due to return back to work next week.   Review of Systems  Constitutional: Negative for unexpected weight change.  HENT: Negative for rhinorrhea.   Respiratory: Negative for cough and shortness of breath.   Cardiovascular: Negative for chest pain.  Gastrointestinal: Negative for constipation and diarrhea.  Genitourinary: Negative for difficulty urinating and menstrual problem.       Mild pain to right groin from recent surgery.   Musculoskeletal: Negative for arthralgias and myalgias.  Skin: Negative for rash.  Allergic/Immunologic: Negative for environmental allergies.  Neurological: Negative for dizziness, numbness and headaches.  Psychiatric/Behavioral: Positive for sleep disturbance. The patient is nervous/anxious.        Past Medical History:  Diagnosis Date  . A-fib ([place])   . Cancer ([place])    skin  .  Diverticulosis   . Gallstones   . Headache   . Heart rate fast   . Kidney stones   . Lower abdominal pain    Right  . Mitral regurgitation    Mild  . Ovarian cyst 09/16/2016   right measuring 4.4 cm  . Pneumonia   . Tricuspid regurgitation    Trace  . Vaginal infection      Social History   Social History  . Marital status: Single    Spouse name: N/A  . Number of children: N/A  . Years of education: N/A   Occupational History  . therapist [employer]   Social History Main Topics  . Smoking status: Current Some Day Smoker    Packs/day: 0.25    Years: 7.00    Types: Cigarettes  . Smokeless tobacco: Never Used     Comment: smokes "once or twice a week"  . Alcohol use Yes     Comment: rare  . Drug use: No  . Sexual activity: Yes    Birth control/ protection: Injection   Other Topics Concern  . Not on file   Social History Narrative  . No narrative on file    Past Surgical History:  Procedure Laterality Date  . CESAREAN SECTION    . CHOLECYSTECTOMY    . GALLBLADDER SURGERY    . LAPAROSCOPIC OVARIAN CYSTECTOMY Right 09/24/2016   Procedure: LAPAROSCOPIC RIGHT OVARIAN CYSTECTOMY, Excision of ovarian cyst, Excision of endometeriosis,;  Surgeon: Brayton Mars, MD;  Location: [facility] ORS;  Service: Gynecology;  Laterality: Right;  . TONSILLECTOMY      Family History  Problem Relation Age of  Onset  . Colon cancer Paternal Grandmother   . Diabetes Paternal Grandmother   . Ulcerative colitis    . Kidney disease    . Stomach cancer Maternal Grandfather   . Diabetes Mother   . Diabetes Father   . Ovarian cancer Maternal Grandmother   . Breast cancer Neg Hx     Allergies  Allergen Reactions  . Lyrica [Pregabalin] Swelling  . Eggs Or Egg-Derived Products Rash and Swelling  . Latex Rash    Current Outpatient Prescriptions on File Prior to Visit  Medication Sig Dispense Refill  . metoprolol (LOPRESSOR) 50 MG tablet Take 1 tablet (50 mg  total) by mouth 2 (two) times daily. 90 tablet 3  . oxyCODONE-acetaminophen (ROXICET) 5-325 MG tablet Take 1-2 tablets by mouth every 4 (four) hours as needed for moderate pain or severe pain. 30 tablet 0  . promethazine (PHENERGAN) 12.5 MG tablet Take 1 tablet (12.5 mg total) by mouth every 6 (six) hours as needed for nausea or vomiting. 20 tablet 0   No current facility-administered medications on file prior to visit.     BP 110/76   Pulse 83   Temp 97.9 F (36.6 C) (Oral)   Ht 5\' 5"  (1.651 m)   Wt 216 lb 12.8 oz (98.3 kg)   SpO2 98%   BMI 36.08 kg/m    Objective:   Physical Exam  Constitutional: She is oriented to person, place, and time. She appears well-nourished.  HENT:  Right Ear: Tympanic membrane and ear canal normal.  Left Ear: Tympanic membrane and ear canal normal.  Nose: Nose normal.  Mouth/Throat: Oropharynx is clear and moist.  Eyes: Conjunctivae and EOM are normal. Pupils are equal, round, and reactive to light.  Neck: Neck supple. No thyromegaly present.  Cardiovascular: Normal rate and regular rhythm.   No murmur heard. Pulmonary/Chest: Effort normal and breath sounds normal. She has no rales.  Abdominal: Soft. Bowel sounds are normal. There is tenderness in the right lower quadrant and suprapubic area.  Musculoskeletal: Normal range of motion.  Lymphadenopathy:    She has no cervical adenopathy.  Neurological: She is alert and oriented to person, place, and time. She has normal reflexes. No cranial nerve deficit.  Skin: Skin is warm and dry. No rash noted.  Mild bruising, healing well to right abdomen.  Psychiatric: She has a normal mood and affect.          Assessment & Plan:

## 2016-10-04 NOTE — Progress Notes (Signed)
Pre visit review using our clinic review tool, if applicable. No additional management support is needed unless otherwise documented below in the visit note. 

## 2016-10-04 NOTE — Assessment & Plan Note (Signed)
TC and trigs above goal. Discussed the importance of a healthy diet and regular exercise in order for weight loss to reduce cholesterol.  Start Fish Oil Daily. Repeat labs in 1 year.

## 2016-10-04 NOTE — Assessment & Plan Note (Signed)
Well managed on PRN Fioricet.  Refill provided today. UDS and controlled substance contract obtained. Discussed to use this medication sparingly.

## 2016-10-04 NOTE — Assessment & Plan Note (Signed)
Immunizations UTD, Pap UTD. Discussed the importance of a healthy diet and regular exercise in order for weight loss, and to reduce the risk of other medical diseases. Exam unremarkable. Recovering well from recent surgery. Labs with hyperlipidemia. Discussed healthy diet and exercise for treatment. Fish Oil daily. Follow up in 1 year for annual exam.

## 2016-10-05 ENCOUNTER — Encounter: Payer: Self-pay | Admitting: Primary Care

## 2016-10-05 DIAGNOSIS — Z79899 Other long term (current) drug therapy: Secondary | ICD-10-CM | POA: Diagnosis not present

## 2016-10-08 ENCOUNTER — Encounter: Payer: Self-pay | Admitting: Primary Care

## 2016-10-09 ENCOUNTER — Encounter: Payer: Self-pay | Admitting: Obstetrics and Gynecology

## 2016-10-15 ENCOUNTER — Other Ambulatory Visit: Payer: Self-pay | Admitting: Obstetrics and Gynecology

## 2016-10-16 NOTE — Telephone Encounter (Signed)
error 

## 2016-10-17 ENCOUNTER — Other Ambulatory Visit: Payer: Self-pay | Admitting: Obstetrics and Gynecology

## 2016-10-17 MED ORDER — OXYCODONE-ACETAMINOPHEN 5-325 MG PO TABS: 1.0000 | ORAL_TABLET | ORAL | 0 refills | Status: DC | PRN

## 2016-10-25 ENCOUNTER — Other Ambulatory Visit: Payer: Self-pay | Admitting: Primary Care

## 2016-10-25 ENCOUNTER — Other Ambulatory Visit: Payer: Self-pay | Admitting: Obstetrics and Gynecology

## 2016-10-25 ENCOUNTER — Telehealth: Payer: Self-pay | Admitting: Obstetrics and Gynecology

## 2016-10-25 DIAGNOSIS — N83201 Unspecified ovarian cyst, right side: Secondary | ICD-10-CM

## 2016-10-25 MED ORDER — OXYCODONE-ACETAMINOPHEN 5-325 MG PO TABS: 1.0000 | ORAL_TABLET | ORAL | 0 refills | Status: DC | PRN

## 2016-10-25 NOTE — Telephone Encounter (Signed)
Pt called and she stated that she saw on mychart that dr de was going to refill her medication but she had not gotten a phone call from you and she stated she normally does to let her know its ready to be picked up, she was wanting to see if that RX will be ready for her to come by today and get and she wanted a call from you when it was ready.

## 2016-10-25 NOTE — Telephone Encounter (Signed)
Ok to refill? Electronically refill request for   promethazine (PHENERGAN) 12.5 MG tablet  Last prescribed on 09/18/2016. Last seen on 10/04/2016.

## 2016-10-25 NOTE — Telephone Encounter (Signed)
Responded to pt thru my chart- Pt came by office in the last 15 minuted and picked up rx. TB to give rx to pt.

## 2016-10-31 ENCOUNTER — Other Ambulatory Visit: Payer: Self-pay | Admitting: Obstetrics and Gynecology

## 2016-11-01 ENCOUNTER — Other Ambulatory Visit: Payer: Self-pay | Admitting: Obstetrics and Gynecology

## 2016-11-01 MED ORDER — OXYCODONE-ACETAMINOPHEN 5-325 MG PO TABS: 1.0000 | ORAL_TABLET | ORAL | 0 refills | Status: DC | PRN

## 2016-11-07 ENCOUNTER — Other Ambulatory Visit: Payer: Self-pay | Admitting: Obstetrics and Gynecology

## 2016-11-08 MED ORDER — OXYCODONE-ACETAMINOPHEN 5-325 MG PO TABS: 1.0000 | ORAL_TABLET | ORAL | 0 refills | Status: DC | PRN

## 2016-11-13 ENCOUNTER — Other Ambulatory Visit: Payer: Self-pay | Admitting: Obstetrics and Gynecology

## 2016-11-14 ENCOUNTER — Other Ambulatory Visit: Payer: Self-pay | Admitting: Obstetrics and Gynecology

## 2016-11-14 MED ORDER — OXYCODONE-ACETAMINOPHEN 5-325 MG PO TABS: 1.0000 | ORAL_TABLET | ORAL | 0 refills | Status: DC | PRN

## 2016-11-19 ENCOUNTER — Other Ambulatory Visit: Payer: Self-pay | Admitting: Obstetrics and Gynecology

## 2016-11-20 ENCOUNTER — Other Ambulatory Visit: Payer: Self-pay | Admitting: Obstetrics and Gynecology

## 2016-11-20 MED ORDER — OXYCODONE-ACETAMINOPHEN 5-325 MG PO TABS: 1.0000 | ORAL_TABLET | ORAL | 0 refills | Status: DC | PRN

## 2016-11-22 ENCOUNTER — Other Ambulatory Visit: Payer: Self-pay | Admitting: Obstetrics and Gynecology

## 2016-11-22 ENCOUNTER — Other Ambulatory Visit: Payer: Self-pay | Admitting: Primary Care

## 2016-11-22 ENCOUNTER — Encounter: Payer: Self-pay | Admitting: Obstetrics and Gynecology

## 2016-11-22 DIAGNOSIS — R000 Tachycardia, unspecified: Secondary | ICD-10-CM

## 2016-11-26 ENCOUNTER — Other Ambulatory Visit: Payer: Self-pay | Admitting: Obstetrics and Gynecology

## 2016-11-26 ENCOUNTER — Other Ambulatory Visit: Payer: Self-pay | Admitting: Primary Care

## 2016-11-26 DIAGNOSIS — G43901 Migraine, unspecified, not intractable, with status migrainosus: Secondary | ICD-10-CM

## 2016-11-26 MED ORDER — BUTALBITAL-APAP-CAFFEINE 50-325-40 MG PO TABS: 1.0000 | ORAL_TABLET | Freq: Two times a day (BID) | ORAL | 0 refills | Status: DC | PRN

## 2016-11-26 MED ORDER — OXYCODONE-ACETAMINOPHEN 5-325 MG PO TABS: 1.0000 | ORAL_TABLET | ORAL | 0 refills | Status: DC | PRN

## 2016-11-27 NOTE — Telephone Encounter (Signed)
Left refill on voice mail at pharmacy  

## 2016-11-29 ENCOUNTER — Other Ambulatory Visit: Payer: Self-pay | Admitting: Obstetrics and Gynecology

## 2016-12-03 ENCOUNTER — Other Ambulatory Visit: Payer: Self-pay | Admitting: Obstetrics and Gynecology

## 2016-12-03 MED ORDER — OXYCODONE-ACETAMINOPHEN 5-325 MG PO TABS: 1.0000 | ORAL_TABLET | ORAL | 0 refills | Status: DC | PRN

## 2016-12-07 ENCOUNTER — Other Ambulatory Visit: Payer: Self-pay | Admitting: Obstetrics and Gynecology

## 2016-12-07 MED ORDER — OXYCODONE-ACETAMINOPHEN 5-325 MG PO TABS: 1.0000 | ORAL_TABLET | ORAL | 0 refills | Status: DC | PRN

## 2016-12-07 NOTE — Telephone Encounter (Signed)
Pt aware per my chart rx is ready for p/u.

## 2016-12-11 ENCOUNTER — Encounter: Payer: Self-pay | Admitting: Internal Medicine

## 2016-12-11 ENCOUNTER — Ambulatory Visit (INDEPENDENT_AMBULATORY_CARE_PROVIDER_SITE_OTHER): Payer: 59 | Admitting: Internal Medicine

## 2016-12-11 VITALS — BP 114/72 | HR 110 | Temp 98.4°F | Wt 223.0 lb

## 2016-12-11 DIAGNOSIS — J329 Chronic sinusitis, unspecified: Secondary | ICD-10-CM

## 2016-12-11 DIAGNOSIS — B9789 Other viral agents as the cause of diseases classified elsewhere: Secondary | ICD-10-CM | POA: Diagnosis not present

## 2016-12-11 MED ORDER — HYDROCODONE-HOMATROPINE 5-1.5 MG/5ML PO SYRP: 5.0000 mL | ORAL_SOLUTION | Freq: Three times a day (TID) | ORAL | 0 refills | Status: DC | PRN

## 2016-12-11 NOTE — Patient Instructions (Signed)
Upper Respiratory Infection, Adult Most upper respiratory infections (URIs) are caused by a virus. A URI affects the nose, throat, and upper air passages. The most common type of URI is often called "the common cold." Follow these instructions at home:  Take medicines only as told by your doctor.  Gargle warm saltwater or take cough drops to comfort your throat as told by your doctor.  Use a warm mist humidifier or inhale steam from a shower to increase air moisture. This may make it easier to breathe.  Drink enough fluid to keep your pee (urine) clear or pale yellow.  Eat soups and other clear broths.  Have a healthy diet.  Rest as needed.  Go back to work when your fever is gone or your doctor says it is okay.  You may need to stay home longer to avoid giving your URI to others.  You can also wear a face mask and wash your hands often to prevent spread of the virus.  Use your inhaler more if you have asthma.  Do not use any tobacco products, including cigarettes, chewing tobacco, or electronic cigarettes. If you need help quitting, ask your doctor. Contact a doctor if:  You are getting worse, not better.  Your symptoms are not helped by medicine.  You have chills.  You are getting more short of breath.  You have brown or red mucus.  You have yellow or brown discharge from your nose.  You have pain in your face, especially when you bend forward.  You have a fever.  You have puffy (swollen) neck glands.  You have pain while swallowing.  You have white areas in the back of your throat. Get help right away if:  You have very bad or constant:  Headache.  Ear pain.  Pain in your forehead, behind your eyes, and over your cheekbones (sinus pain).  Chest pain.  You have long-lasting (chronic) lung disease and any of the following:  Wheezing.  Long-lasting cough.  Coughing up blood.  A change in your usual mucus.  You have a stiff neck.  You have  changes in your:  Vision.  Hearing.  Thinking.  Mood. This information is not intended to replace advice given to you by your health care provider. Make sure you discuss any questions you have with your health care provider. Document Released: 04/23/2008 Document Revised: 07/08/2016 Document Reviewed: 02/10/2014 Elsevier Interactive Patient Education  2017 Elsevier Inc.  

## 2016-12-11 NOTE — Progress Notes (Signed)
HPI  Pt presents to the clinic today with c/o facial pain and pressure, nasal congestion, sore throat, cough and chest congestion. This started 1 week ago. She is blowing green mucous out of her nose. She denies difficulty swallowing. The cough is productive of green mucous. She reports she mostly produces mucous in the morning. She denies fever, chills or body aches. She has tried Tylenol cold and sinus, Robitussin and Delsym with minimal relief. She has no history of allergies or breathing problems. She has had sick contacts.  Review of Systems        Past Medical History:  Diagnosis Date  . A-fib ([place])   . Cancer ([place])    skin  . Diverticulosis   . Gallstones   . Headache   . Heart rate fast   . Kidney stones   . Lower abdominal pain    Right  . Mitral regurgitation    Mild  . Ovarian cyst 09/16/2016   right measuring 4.4 cm  . Pneumonia   . Tricuspid regurgitation    Trace  . Vaginal infection     Family History  Problem Relation Age of Onset  . Colon cancer Paternal Grandmother   . Diabetes Paternal Grandmother   . Ulcerative colitis    . Kidney disease    . Stomach cancer Maternal Grandfather   . Diabetes Mother   . Diabetes Father   . Ovarian cancer Maternal Grandmother   . Breast cancer Neg Hx     Social History   Social History  . Marital status: Single    Spouse name: N/A  . Number of children: N/A  . Years of education: N/A   Occupational History  . therapist [employer]   Social History Main Topics  . Smoking status: Current Some Day Smoker    Packs/day: 0.25    Years: 7.00    Types: Cigarettes  . Smokeless tobacco: Never Used     Comment: smokes "once or twice a week"  . Alcohol use Yes     Comment: rare  . Drug use: No  . Sexual activity: Yes    Birth control/ protection: Injection   Other Topics Concern  . Not on file   Social History Narrative  . No narrative on file    Allergies  Allergen Reactions  . Lyrica  [Pregabalin] Swelling  . Eggs Or Egg-Derived Products Rash and Swelling  . Latex Rash     Constitutional: Positive headache. Denies fatigue, fever or abrupt weight changes.  HEENT:  Positive nasal congestion, sore throat. Denies eye redness, eye pain, pressure behind the eyes, facial pain, ear pain, ringing in the ears, wax buildup, runny nose or bloody nose. Respiratory: Positive cough. Denies difficulty breathing or shortness of breath.  Cardiovascular: Denies chest pain, chest tightness, palpitations or swelling in the hands or feet.   No other specific complaints in a complete review of systems (except as listed in HPI above).  Objective:   BP 114/72   Pulse (!) 110   Temp 98.4 F (36.9 C) (Oral)   Wt 223 lb (101.2 kg)   SpO2 98%   BMI 37.11 kg/m  Wt Readings from Last 3 Encounters:  12/11/16 223 lb (101.2 kg)  10/04/16 216 lb 12.8 oz (98.3 kg)  10/02/16 215 lb 4.8 oz (97.7 kg)     General: Appears her stated age, well developed, well nourished in NAD. HEENT: Head: normal shape and size, maxillary sinus tenderness noted; Eyes: sclera  white, no icterus, conjunctiva pink; Ears: Tm's pink but intact, normal light reflex, + serous effusion bilaterally; Nose: mucosa pink and moist, septum midline; Throat/Mouth: + PND. Teeth present, mucosa pink and moist, no exudate noted, no lesions or ulcerations noted.  Neck: No cervical lymphadenopathy.  Cardiovascular: Tachycardic with normal rhythm.  Pulmonary/Chest: Normal effort and positive vesicular breath sounds. No respiratory distress. No wheezes, rales or ronchi noted.       Assessment & Plan:   Viral Sinusitis:  Get some rest and drink plenty of water Can use a Neti Pot which can be purchased from your local drug store Start Flonase and Zyrtec OTC RX for Hycodan for cough  If symptoms persist, call back on Friday, will send in Augmentin BID x10 days   Webb Silversmith, NP

## 2016-12-12 ENCOUNTER — Other Ambulatory Visit: Payer: Self-pay | Admitting: Obstetrics and Gynecology

## 2016-12-12 MED ORDER — OXYCODONE-ACETAMINOPHEN 5-325 MG PO TABS: 1.0000 | ORAL_TABLET | ORAL | 0 refills | Status: DC | PRN

## 2016-12-14 ENCOUNTER — Encounter: Payer: Self-pay | Admitting: Internal Medicine

## 2016-12-14 MED ORDER — AMOXICILLIN-POT CLAVULANATE 875-125 MG PO TABS: 1.0000 | ORAL_TABLET | Freq: Two times a day (BID) | ORAL | 0 refills | Status: DC

## 2016-12-14 NOTE — Telephone Encounter (Signed)
To treating provider

## 2016-12-17 ENCOUNTER — Other Ambulatory Visit: Payer: Self-pay | Admitting: Obstetrics and Gynecology

## 2016-12-17 MED ORDER — OXYCODONE-ACETAMINOPHEN 5-325 MG PO TABS: 1.0000 | ORAL_TABLET | ORAL | 0 refills | Status: DC | PRN

## 2016-12-21 ENCOUNTER — Other Ambulatory Visit: Payer: Self-pay | Admitting: Obstetrics and Gynecology

## 2016-12-21 MED ORDER — OXYCODONE-ACETAMINOPHEN 5-325 MG PO TABS: 1.0000 | ORAL_TABLET | ORAL | 0 refills | Status: DC | PRN

## 2016-12-25 ENCOUNTER — Other Ambulatory Visit: Payer: Self-pay | Admitting: Obstetrics and Gynecology

## 2016-12-25 ENCOUNTER — Inpatient Hospital Stay: Admission: RE | Admit: 2016-12-25 | Payer: 59 | Source: Ambulatory Visit

## 2016-12-25 ENCOUNTER — Encounter: Payer: 59 | Admitting: Obstetrics and Gynecology

## 2016-12-26 ENCOUNTER — Other Ambulatory Visit: Payer: Self-pay | Admitting: Obstetrics and Gynecology

## 2016-12-26 MED ORDER — OXYCODONE-ACETAMINOPHEN 5-325 MG PO TABS: 1.0000 | ORAL_TABLET | ORAL | 0 refills | Status: DC | PRN

## 2016-12-26 NOTE — Telephone Encounter (Signed)
Pt aware per my chart - percocet prescription is up front. Informed per mad she will get no more percocet until her pre-op appointment.

## 2016-12-31 ENCOUNTER — Encounter: Admission: RE | Payer: Self-pay | Source: Ambulatory Visit

## 2016-12-31 ENCOUNTER — Ambulatory Visit: Admission: RE | Admit: 2016-12-31 | Payer: 59 | Source: Ambulatory Visit | Admitting: Obstetrics and Gynecology

## 2016-12-31 SURGERY — HYSTERECTOMY, VAGINAL
Anesthesia: General

## 2017-01-24 ENCOUNTER — Other Ambulatory Visit: Payer: Self-pay | Admitting: Primary Care

## 2017-01-24 DIAGNOSIS — G43901 Migraine, unspecified, not intractable, with status migrainosus: Secondary | ICD-10-CM

## 2017-01-24 MED ORDER — BUTALBITAL-APAP-CAFFEINE 50-325-40 MG PO TABS: 1.0000 | ORAL_TABLET | Freq: Two times a day (BID) | ORAL | 0 refills | Status: DC | PRN

## 2017-01-24 NOTE — Telephone Encounter (Signed)
Ok to refill? Electronically refill request for butalbital-acetaminophen-caffeine (FIORICET, ESGIC) 50-325-40 MG tablet. Last 11/26/16. Last seen on 12/11/2016.

## 2017-01-25 ENCOUNTER — Encounter: Payer: Self-pay | Admitting: Primary Care

## 2017-01-25 NOTE — Telephone Encounter (Signed)
Already addressed

## 2017-01-25 NOTE — Telephone Encounter (Signed)
Pt left v/m requesting cb when fioricet called to [facility].

## 2017-01-25 NOTE — Telephone Encounter (Signed)
Called in medication to the pharmacy as instructed. 

## 2017-01-25 NOTE — Telephone Encounter (Signed)
I think this was already addressed.  Routed to PCP for input.

## 2017-02-06 IMAGING — US US PELVIS COMPLETE
1 series · 13 of 25 positions shown · non-contrast
Comparison: CT scan from earlier today and pelvic ultrasound
July 26, 2015

CLINICAL DATA: Right lower quadrant abdominal pain for 2 days.
Abnormal CT scan.

EXAM:
TRANSABDOMINAL AND TRANSVAGINAL ULTRASOUND OF PELVIS
DOPPLER ULTRASOUND OF OVARIES
TECHNIQUE: Both transabdominal and transvaginal ultrasound examinations of the
pelvis were performed. Transabdominal technique was performed for
global imaging of the pelvis including uterus, ovaries, adnexal
regions, and pelvic cul-de-sac.
It was necessary to proceed with endovaginal exam following the
transabdominal exam to visualize the uterus, ovaries, and
endometrium. Color and duplex Doppler ultrasound was utilized to
evaluate blood flow to the ovaries.

[Series 1: us pelvis complete · 0.24mm/px · 13 of 109 slices shown]
[im 1/109]
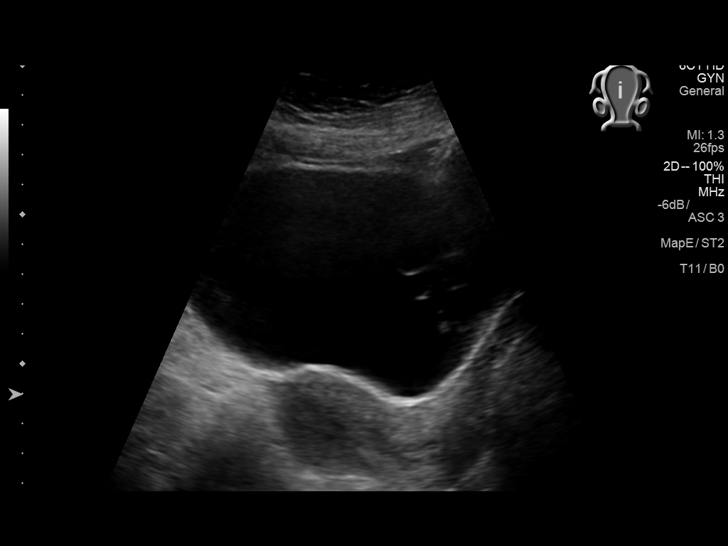
[im 10/109]
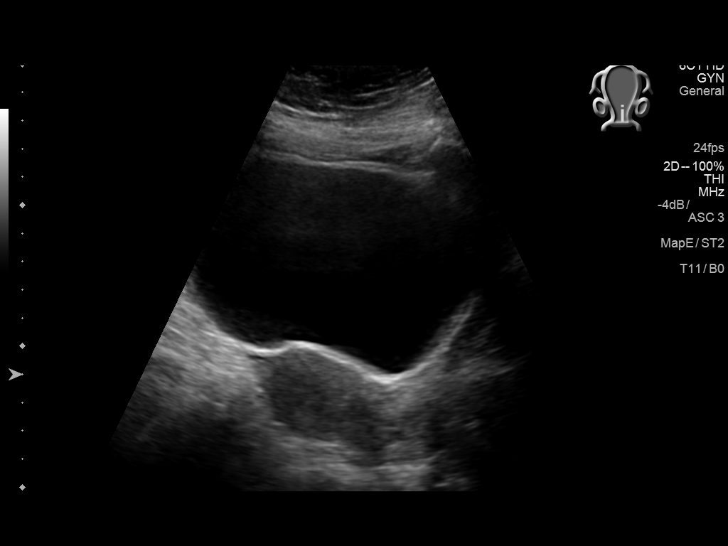
[im 19/109]
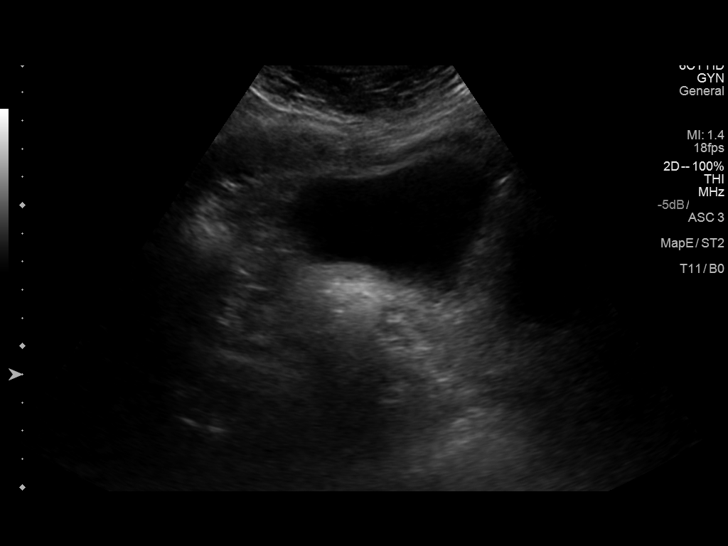
[im 28/109]
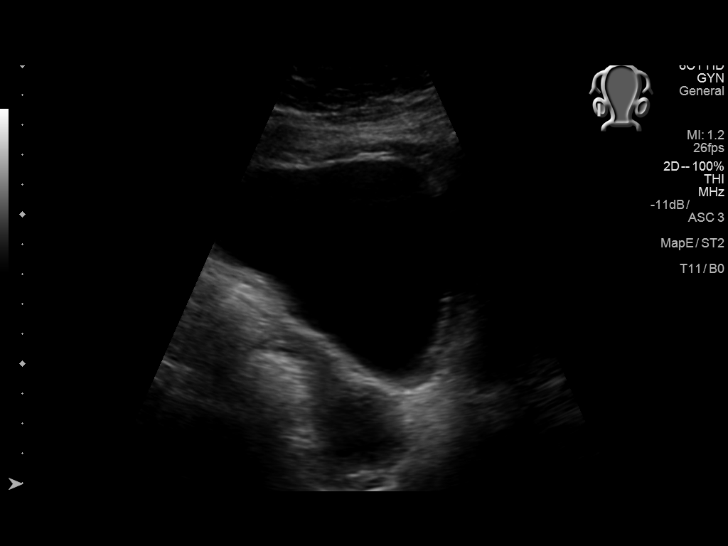
[im 37/109]
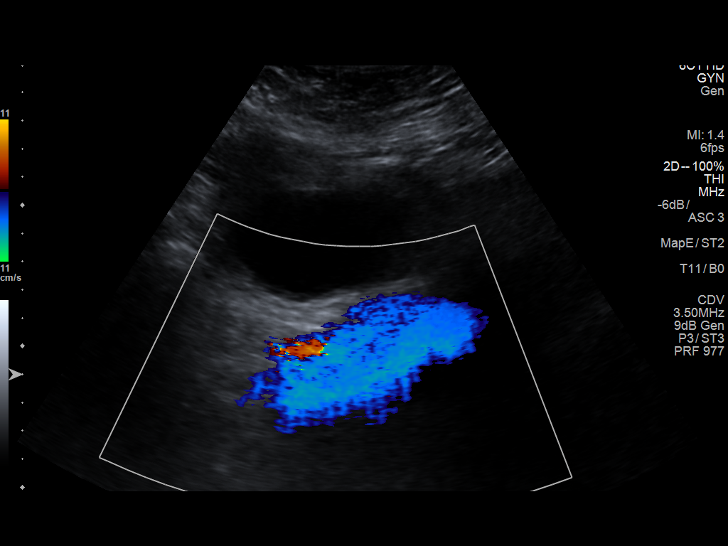
[im 46/109]
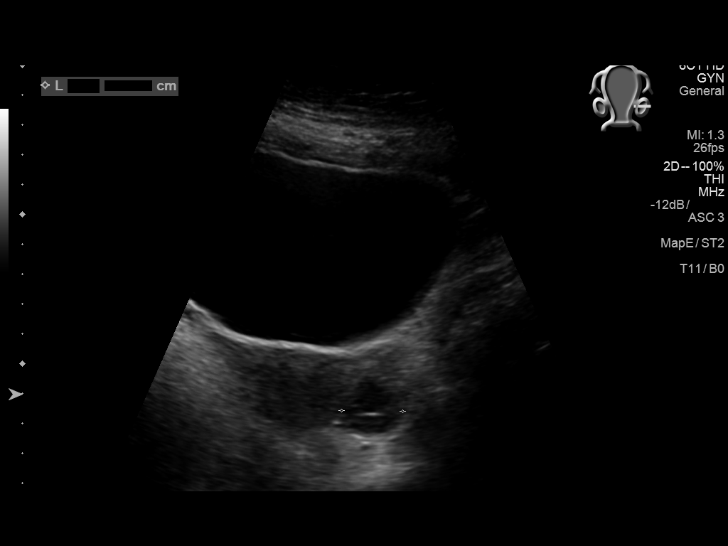
[im 55/109]
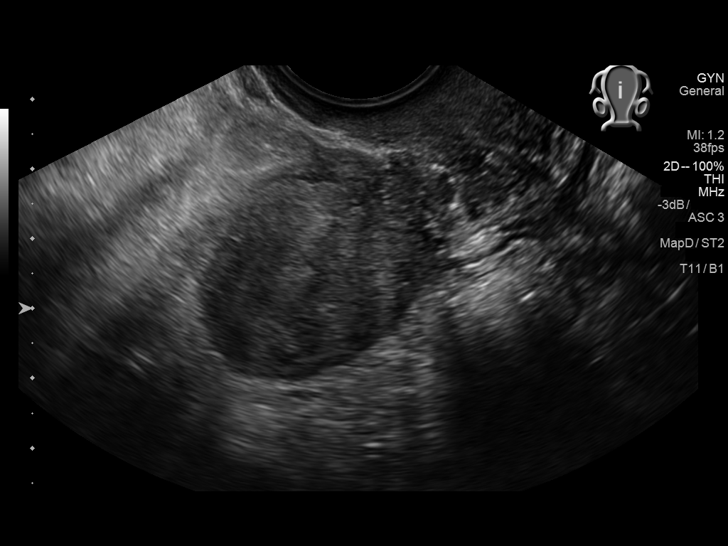
[im 64/109]
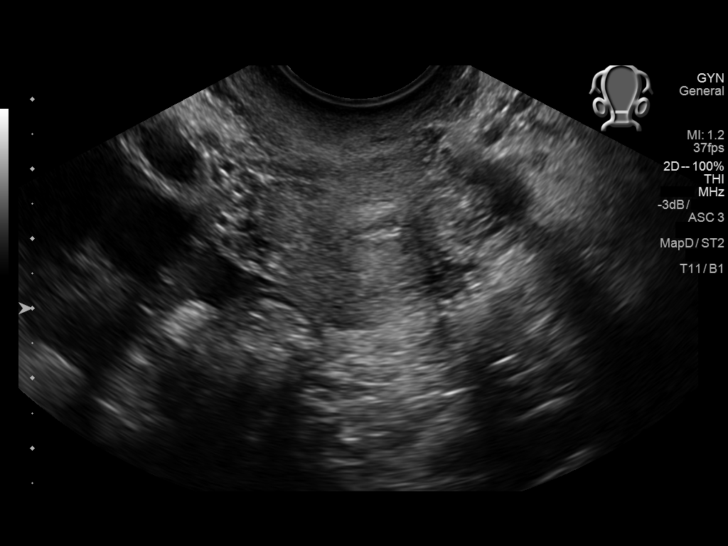
[im 73/109]
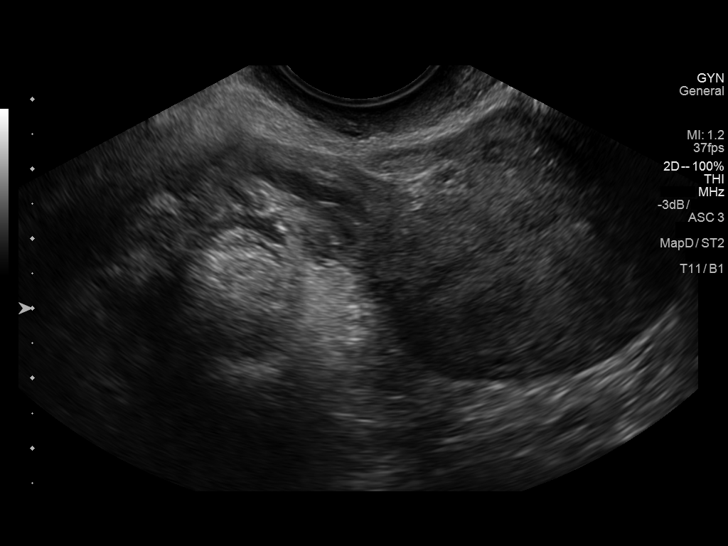
[im 82/109]
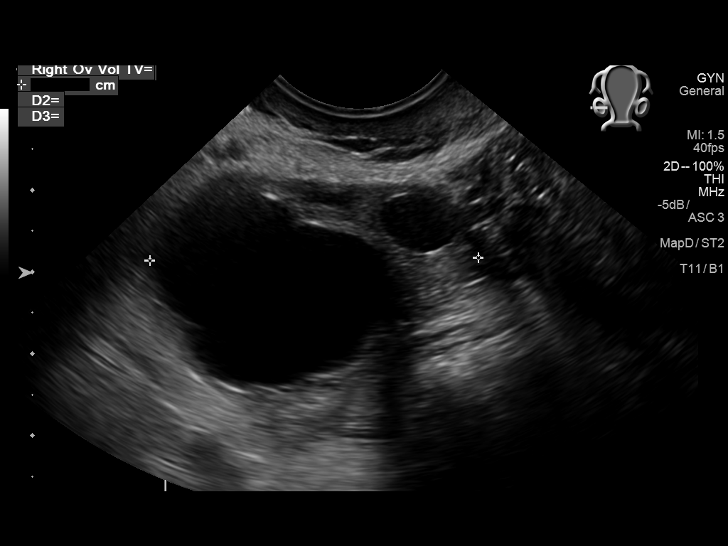
[im 91/109]
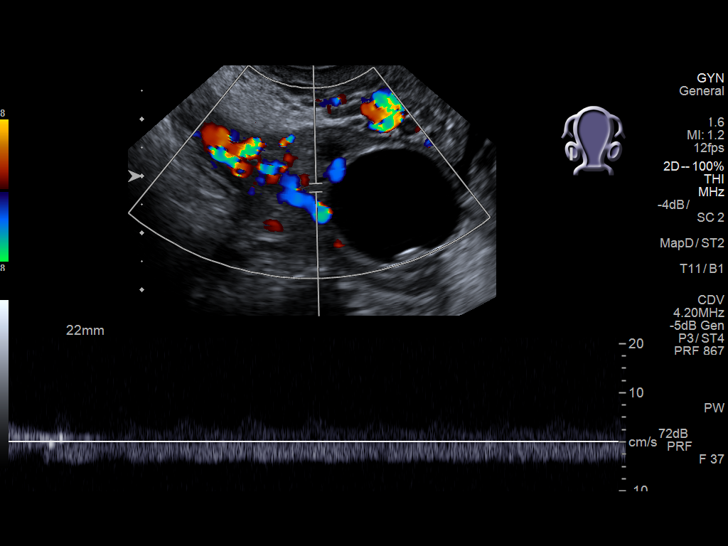
[im 100/109]
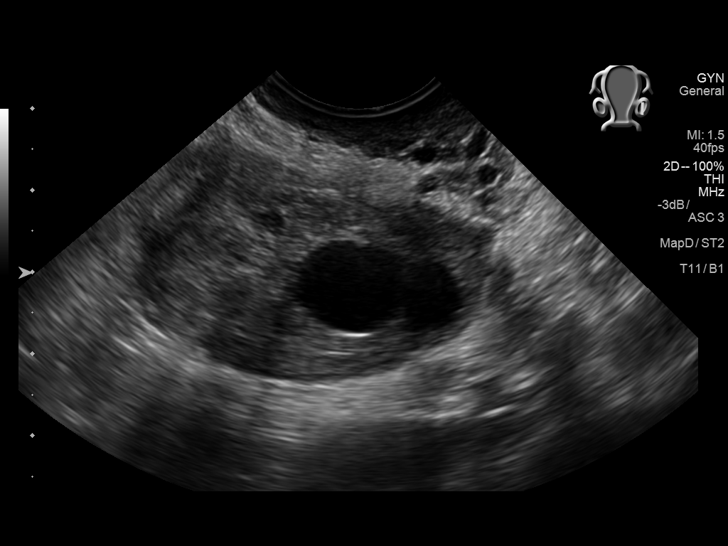
[im 109/109]
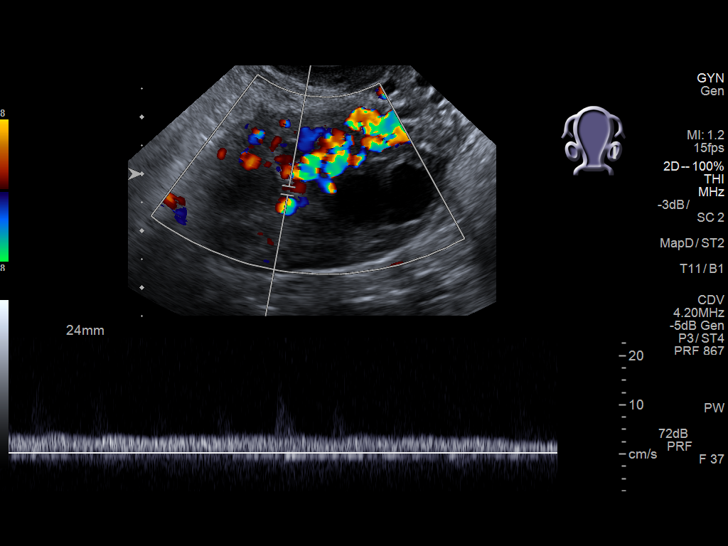

[13 of 25 positions shown; findings below may reference images not displayed]

FINDINGS: Uterus

Measurements: 7 x 3.2 x 4.4 cm. No fibroids or other mass
visualized.

Endometrium

Thickness: 7.4 mm.  No focal abnormality visualized.

Right ovary

Measurements: 4.8 x 3.1 x 4.0 cm. Dominant cyst/ follicle measuring
4.4 x 2.0 x 4.2 cm

Left ovary

Measurements: 4.2 x 2.3 x 2.1 cm. Septated cyst/follicle measuring
2.1 cm, within normal limits.

Pulsed Doppler evaluation of both ovaries demonstrates normal
low-resistance arterial and venous waveforms.

Other findings

Trace physiologic fluid in the pelvis.
IMPRESSION: 1. No evidence of ovarian torsion. There is a dominant follicle
measuring up to 4.4 cm on the right which could be the source of the
patient's pain. A smaller septated follicle seen on the left.

## 2017-02-11 ENCOUNTER — Encounter: Payer: Self-pay | Admitting: *Deleted

## 2017-02-11 ENCOUNTER — Emergency Department: Payer: Medicaid Other

## 2017-02-11 ENCOUNTER — Emergency Department
Admission: EM | Admit: 2017-02-11 | Discharge: 2017-02-11 | Disposition: A | Discharge status: Home / Self Care | Payer: Medicaid Other | Attending: Emergency Medicine | Admitting: Emergency Medicine

## 2017-02-11 DIAGNOSIS — F17210 Nicotine dependence, cigarettes, uncomplicated: Secondary | ICD-10-CM | POA: Diagnosis not present

## 2017-02-11 DIAGNOSIS — Z5321 Procedure and treatment not carried out due to patient leaving prior to being seen by health care provider: Secondary | ICD-10-CM | POA: Diagnosis not present

## 2017-02-11 DIAGNOSIS — R079 Chest pain, unspecified: Secondary | ICD-10-CM | POA: Insufficient documentation

## 2017-02-11 LAB — BASIC METABOLIC PANEL
Anion gap: 9 (ref 5–15)
BUN: 8 mg/dL (ref 6–20)
CHLORIDE: 106 mmol/L (ref 101–111)
CO2: 25 mmol/L (ref 22–32)
CREATININE: 0.74 mg/dL (ref 0.44–1.00)
Calcium: 10.1 mg/dL (ref 8.9–10.3)
GFR calc Af Amer: >60 mL/min (ref >60)
GLUCOSE: 106 mg/dL — AB (ref 65–99)
Potassium: 3.8 mmol/L (ref 3.5–5.1)
SODIUM: 140 mmol/L (ref 135–145)

## 2017-02-11 LAB — CBC
HCT: 40.4 % (ref 35.0–47.0)
Hemoglobin: 14.3 g/dL (ref 12.0–16.0)
MCH: 34.3 pg — ABNORMAL HIGH (ref 26.0–34.0)
MCHC: 35.4 g/dL (ref 32.0–36.0)
MCV: 96.9 fL (ref 80.0–100.0)
PLATELETS: 287 10*3/uL (ref 150–440)
RBC: 4.17 MIL/uL (ref 3.80–5.20)
RDW: 14.1 % (ref 11.5–14.5)
WBC: 5.8 10*3/uL (ref 3.6–11.0)

## 2017-02-11 LAB — TROPONIN I: Troponin I: <0.03 ng/mL (ref <0.03)

## 2017-02-11 NOTE — ED Notes (Signed)
Called for room, not in waiting room.

## 2017-02-11 NOTE — ED Triage Notes (Signed)
Pt ambulatory to triage.  Pt has chest pain that started at 1400 today.  Pt reports intermittent sob.  Pt has nausea and a headache.    Pt alert.

## 2017-02-13 ENCOUNTER — Other Ambulatory Visit: Payer: Self-pay | Admitting: Primary Care

## 2017-02-13 ENCOUNTER — Ambulatory Visit (INDEPENDENT_AMBULATORY_CARE_PROVIDER_SITE_OTHER): Payer: 59 | Admitting: Primary Care

## 2017-02-13 ENCOUNTER — Encounter: Payer: Self-pay | Admitting: Primary Care

## 2017-02-13 VITALS — BP 122/74 | HR 79 | Temp 98.0°F | Ht 65.0 in | Wt 214.0 lb

## 2017-02-13 DIAGNOSIS — G43901 Migraine, unspecified, not intractable, with status migrainosus: Secondary | ICD-10-CM

## 2017-02-13 DIAGNOSIS — F411 Generalized anxiety disorder: Secondary | ICD-10-CM

## 2017-02-13 DIAGNOSIS — L989 Disorder of the skin and subcutaneous tissue, unspecified: Secondary | ICD-10-CM | POA: Diagnosis not present

## 2017-02-13 MED ORDER — ESCITALOPRAM OXALATE 10 MG PO TABS: 10.0000 mg | ORAL_TABLET | Freq: Every day | ORAL | 1 refills | Status: DC

## 2017-02-13 NOTE — Assessment & Plan Note (Signed)
Continues despite treatment. Referral placed to dermatology.

## 2017-02-13 NOTE — Assessment & Plan Note (Signed)
Ongoing, no improvement with self calming techniques. Suspect insomnia related. GAD 7 score of 15 today, denies SI/HI.  Discussed treatment options, she elects for medication. Rx for Lexapro 10 mg sent to pharmacy.  Patient is to take 1/2 tablet daily for 8 days, then advance to 1 full tablet thereafter. We discussed possible side effects of headache, GI upset, drowsiness, and SI/HI. If thoughts of SI/HI develop, we discussed to present to the emergency immediately. Patient verbalized understanding.   Follow up in 6 weeks.

## 2017-02-13 NOTE — Progress Notes (Signed)
Pre visit review using our clinic review tool, if applicable. No additional management support is needed unless otherwise documented below in the visit note. 

## 2017-02-13 NOTE — Progress Notes (Signed)
Subjective:    Patient ID: Katherine Murray, female    DOB: [birth year], 36 y.o.   MRN: [id number]  HPI  Katherine Murray is a 36 year old female who presents today for emergency department follow up and a chief complaint of anxiety.   She presented to [facility] ED on 02/11/17 with a chief complaint of chest pain. Her pain was located to the left chest with radiation down the left upper extremity that began 1.5 hours before checking into the ED. She also experienced shortness of breath. She underwent ECG, labs, and chest xray in triage which were unremarkable. She left before she was evaluated by the provider as her pain improved.   She thinks her pain was secondary to stress/lack of sleep. Since her last visit she's continued to struggle with sleeping and anxiety. During her visit in November she endorsed irritability, anxiety, stress, inability to sleep. She's now sleeping 2-3 hours every night as she lays in bed for hours. She continues to have trouble falling asleep as her mind will race for hours. She continues to struggle with stress at work. GAD 7 score of 15 today.  She's tried OTC melatonin which worked temporarily. She's also tried benadryl without much improvement.   2) Lesion: Located to right posterior third digit of hand near MCP joint. She denies injury, pain, itching. She was initially evaluated for this in September 2017, was treated with Cicloprox. Overall she's noticed increase in size of lesion without any improvement.    Review of Systems  Eyes: Negative for visual disturbance.  Respiratory: Negative for shortness of breath.   Cardiovascular: Negative for chest pain and palpitations.  Musculoskeletal: Negative for arthralgias.  Skin:       Skin abnormality   Neurological: Negative for dizziness.  Psychiatric/Behavioral: Positive for sleep disturbance. Negative for suicidal ideas. The patient is nervous/anxious.        Past Medical History:  Diagnosis Date  . A-fib ([place])   .  Cancer ([place])    skin  . Diverticulosis   . Gallstones   . Headache   . Heart rate fast   . Kidney stones   . Lower abdominal pain    Right  . Mitral regurgitation    Mild  . Ovarian cyst 09/16/2016   right measuring 4.4 cm  . Pneumonia   . Tricuspid regurgitation    Trace  . Vaginal infection      Social History   Social History  . Marital status: Single    Spouse name: N/A  . Number of children: N/A  . Years of education: N/A   Occupational History  . therapist [employer]   Social History Main Topics  . Smoking status: Current Some Day Smoker    Packs/day: 0.25    Years: 7.00    Types: Cigarettes  . Smokeless tobacco: Never Used     Comment: smokes "once or twice a week"  . Alcohol use Yes     Comment: rare  . Drug use: No  . Sexual activity: Yes    Birth control/ protection: Injection   Other Topics Concern  . Not on file   Social History Narrative  . No narrative on file    Past Surgical History:  Procedure Laterality Date  . CESAREAN SECTION    . CHOLECYSTECTOMY    . GALLBLADDER SURGERY    . LAPAROSCOPIC OVARIAN CYSTECTOMY Right 09/24/2016   Procedure: LAPAROSCOPIC RIGHT OVARIAN CYSTECTOMY, Excision of ovarian cyst, Excision  of endometeriosis,;  Surgeon: Brayton Mars, MD;  Location: [facility] ORS;  Service: Gynecology;  Laterality: Right;  . TONSILLECTOMY      Family History  Problem Relation Age of Onset  . Colon cancer Paternal Grandmother   . Diabetes Paternal Grandmother   . Ulcerative colitis    . Kidney disease    . Stomach cancer Maternal Grandfather   . Diabetes Mother   . Diabetes Father   . Ovarian cancer Maternal Grandmother   . Breast cancer Neg Hx     Allergies  Allergen Reactions  . Lyrica [Pregabalin] Swelling  . Eggs Or Egg-Derived Products Rash and Swelling  . Latex Rash    Current Outpatient Prescriptions on File Prior to Visit  Medication Sig Dispense Refill  . butalbital-acetaminophen-caffeine  (FIORICET, ESGIC) 50-325-40 MG tablet Take 1 tablet by mouth 2 (two) times daily as needed for migraine. 30 tablet 0  . metoprolol (LOPRESSOR) 50 MG tablet TAKE 1 TABLET(50 MG) BY MOUTH TWICE DAILY 180 tablet 3   No current facility-administered medications on file prior to visit.     BP 122/74   Pulse 79   Temp 98 F (36.7 C) (Oral)   Ht 5\' 5"  (1.651 m)   Wt 214 lb (97.1 kg)   SpO2 98%   BMI 35.61 kg/m    Objective:   Physical Exam  Constitutional: She appears well-nourished.  Neck: Neck supple.  Cardiovascular: Normal rate and regular rhythm.   Pulmonary/Chest: Effort normal and breath sounds normal.  Skin: Skin is warm and dry.  Circular lesion to right third posterior digit, over MCP joint. Approximated edges. No erythema or s/s of infection.  Psychiatric:  Anxious during exam, tearful at times. Appropriate behavior.          Assessment & Plan:  [facility] ED Follow Up:  Chest pain x 1.5 hours at work. Improved before seen in ED by MD. ECG, labs, and imaging unremarkable. Suspect symptoms secondary to anxiety. Will treat anxiety. She will update if symptoms return.  Sheral Flow, NP

## 2017-02-13 NOTE — Telephone Encounter (Signed)
Please notify patient that she's using this medication too often. It's only to be used sparingly for headaches. I recommend she do Excedrin migraine, Aleve, or Tylenol for now. We may need to discuss daily preventative treatment of headaches during her next visit if she's having to use the Fioricet so often.

## 2017-02-13 NOTE — Patient Instructions (Addendum)
Start Lexapro 10 mg tablets for anxiety. Start by taking 1/2 tablet by mouth daily for 8 days, then advance to 1 full tablet by mouth thereafter.  You will be contacted regarding your referral to Dermatology.  Please let us know if you have not heard back within one week.   Please schedule a follow up office visit with me in 6 weeks for re-evaluation.   It was a pleasure to see you today!

## 2017-02-14 ENCOUNTER — Other Ambulatory Visit: Payer: Self-pay | Admitting: Primary Care

## 2017-02-14 ENCOUNTER — Encounter: Payer: Self-pay | Admitting: Primary Care

## 2017-02-14 DIAGNOSIS — G43901 Migraine, unspecified, not intractable, with status migrainosus: Secondary | ICD-10-CM

## 2017-02-14 NOTE — Telephone Encounter (Signed)
Message left for patient to return my call.  

## 2017-02-18 NOTE — Telephone Encounter (Signed)
notified patient through [place] on 02/14/2017

## 2017-02-22 ENCOUNTER — Telehealth: Payer: Self-pay | Admitting: Primary Care

## 2017-02-22 NOTE — Telephone Encounter (Signed)
Left message for pt to cal re: insurance and referral / lt

## 2017-04-18 ENCOUNTER — Encounter: Payer: Self-pay | Admitting: Obstetrics and Gynecology

## 2017-05-16 ENCOUNTER — Emergency Department: Payer: Medicaid Other

## 2017-05-16 ENCOUNTER — Encounter: Payer: Self-pay | Admitting: Emergency Medicine

## 2017-05-16 ENCOUNTER — Observation Stay: Payer: Medicaid Other

## 2017-05-16 ENCOUNTER — Observation Stay
Admission: EM | Admit: 2017-05-16 | Discharge: 2017-05-18 | Disposition: A | Discharge status: Home / Self Care | Payer: Medicaid Other | Attending: Internal Medicine | Admitting: Internal Medicine

## 2017-05-16 DIAGNOSIS — M5116 Intervertebral disc disorders with radiculopathy, lumbar region: Principal | ICD-10-CM | POA: Insufficient documentation

## 2017-05-16 DIAGNOSIS — I4891 Unspecified atrial fibrillation: Secondary | ICD-10-CM | POA: Insufficient documentation

## 2017-05-16 DIAGNOSIS — M549 Dorsalgia, unspecified: Secondary | ICD-10-CM | POA: Diagnosis present

## 2017-05-16 DIAGNOSIS — M48061 Spinal stenosis, lumbar region without neurogenic claudication: Secondary | ICD-10-CM | POA: Diagnosis not present

## 2017-05-16 DIAGNOSIS — Z79899 Other long term (current) drug therapy: Secondary | ICD-10-CM | POA: Diagnosis not present

## 2017-05-16 DIAGNOSIS — I340 Nonrheumatic mitral (valve) insufficiency: Secondary | ICD-10-CM | POA: Insufficient documentation

## 2017-05-16 DIAGNOSIS — F17210 Nicotine dependence, cigarettes, uncomplicated: Secondary | ICD-10-CM | POA: Insufficient documentation

## 2017-05-16 LAB — CBC WITH DIFFERENTIAL/PLATELET
BASOS ABS: 0 10*3/uL (ref 0–0.1)
BASOS PCT: 1 %
Eosinophils Absolute: 0 10*3/uL (ref 0–0.7)
Eosinophils Relative: 0 %
HEMATOCRIT: 39.4 % (ref 35.0–47.0)
Hemoglobin: 13.8 g/dL (ref 12.0–16.0)
Lymphocytes Relative: 39 %
Lymphs Abs: 3 10*3/uL (ref 1.0–3.6)
MCH: 34.3 pg — ABNORMAL HIGH (ref 26.0–34.0)
MCHC: 34.9 g/dL (ref 32.0–36.0)
MCV: 98.4 fL (ref 80.0–100.0)
MONO ABS: 0.3 10*3/uL (ref 0.2–0.9)
MONOS PCT: 4 %
NEUTROS ABS: 4.3 10*3/uL (ref 1.4–6.5)
NEUTROS PCT: 56 %
Platelets: 219 10*3/uL (ref 150–440)
RBC: 4.01 MIL/uL (ref 3.80–5.20)
RDW: 12.8 % (ref 11.5–14.5)
WBC: 7.6 10*3/uL (ref 3.6–11.0)

## 2017-05-16 LAB — COMPREHENSIVE METABOLIC PANEL
ALBUMIN: 4.1 g/dL (ref 3.5–5.0)
ALT: 30 U/L (ref 14–54)
AST: 33 U/L (ref 15–41)
Alkaline Phosphatase: 37 U/L — ABNORMAL LOW (ref 38–126)
Anion gap: 6 (ref 5–15)
BILIRUBIN TOTAL: 0.5 mg/dL (ref 0.3–1.2)
BUN: 8 mg/dL (ref 6–20)
CALCIUM: 9.2 mg/dL (ref 8.9–10.3)
CO2: 25 mmol/L (ref 22–32)
CREATININE: 0.85 mg/dL (ref 0.44–1.00)
Chloride: 108 mmol/L (ref 101–111)
GFR calc Af Amer: >60 mL/min (ref >60)
GLUCOSE: 104 mg/dL — AB (ref 65–99)
Potassium: 3.9 mmol/L (ref 3.5–5.1)
Sodium: 139 mmol/L (ref 135–145)
TOTAL PROTEIN: 7 g/dL (ref 6.5–8.1)

## 2017-05-16 MED ORDER — ONDANSETRON HCL 4 MG/2ML IJ SOLN
4.0000 mg | Freq: Four times a day (QID) | INTRAMUSCULAR | Status: DC | PRN
  Administered 2017-05-17: 4 mg via INTRAVENOUS
  Filled 2017-05-16 (×2): qty 2

## 2017-05-16 MED ORDER — MORPHINE SULFATE (PF) 2 MG/ML IV SOLN
2.0000 mg | INTRAVENOUS | Status: DC | PRN
  Administered 2017-05-16 – 2017-05-18 (×6): 2 mg via INTRAVENOUS
  Filled 2017-05-16 (×6): qty 1

## 2017-05-16 MED ORDER — ACETAMINOPHEN 325 MG PO TABS: 650.0000 mg | ORAL_TABLET | Freq: Four times a day (QID) | ORAL | Status: DC | PRN

## 2017-05-16 MED ORDER — GABAPENTIN 300 MG PO CAPS: 300.0000 mg | ORAL_CAPSULE | Freq: Two times a day (BID) | ORAL | Status: DC

## 2017-05-16 MED ORDER — SODIUM CHLORIDE 0.9 % IV SOLN
INTRAVENOUS | Status: DC
  Administered 2017-05-16: 19:00:00 via INTRAVENOUS

## 2017-05-16 MED ORDER — OXYCODONE HCL 5 MG PO TABS
10.0000 mg | ORAL_TABLET | ORAL | Status: DC | PRN
  Administered 2017-05-16 – 2017-05-18 (×7): 10 mg via ORAL
  Filled 2017-05-16 (×7): qty 2

## 2017-05-16 MED ORDER — ACETAMINOPHEN 650 MG RE SUPP: 650.0000 mg | Freq: Four times a day (QID) | RECTAL | Status: DC | PRN

## 2017-05-16 MED ORDER — GABAPENTIN 300 MG PO CAPS
300.0000 mg | ORAL_CAPSULE | Freq: Once | ORAL | Status: DC
  Filled 2017-05-16: qty 1

## 2017-05-16 MED ORDER — ENOXAPARIN SODIUM 40 MG/0.4ML ~~LOC~~ SOLN
40.0000 mg | SUBCUTANEOUS | Status: DC
  Administered 2017-05-16 – 2017-05-17 (×2): 40 mg via SUBCUTANEOUS
  Filled 2017-05-16 (×2): qty 0.4

## 2017-05-16 MED ORDER — KETOROLAC TROMETHAMINE 30 MG/ML IJ SOLN
30.0000 mg | Freq: Three times a day (TID) | INTRAMUSCULAR | Status: DC | PRN
  Administered 2017-05-17: 30 mg via INTRAVENOUS
  Filled 2017-05-16: qty 1

## 2017-05-16 MED ORDER — OXYCODONE HCL 5 MG PO TABS
5.0000 mg | ORAL_TABLET | ORAL | Status: DC | PRN
  Administered 2017-05-16: 5 mg via ORAL
  Filled 2017-05-16: qty 1

## 2017-05-16 MED ORDER — HYDROMORPHONE HCL 1 MG/ML IJ SOLN
1.0000 mg | Freq: Once | INTRAMUSCULAR | Status: AC
  Administered 2017-05-16: 1 mg via INTRAVENOUS
  Filled 2017-05-16: qty 1

## 2017-05-16 MED ORDER — METOPROLOL TARTRATE 50 MG PO TABS
50.0000 mg | ORAL_TABLET | Freq: Two times a day (BID) | ORAL | Status: DC
  Administered 2017-05-16 – 2017-05-18 (×4): 50 mg via ORAL
  Filled 2017-05-16 (×4): qty 1

## 2017-05-16 MED ORDER — ACETAMINOPHEN 500 MG PO TABS
1000.0000 mg | ORAL_TABLET | Freq: Once | ORAL | Status: AC
  Administered 2017-05-16: 1000 mg via ORAL
  Filled 2017-05-16: qty 2

## 2017-05-16 MED ORDER — ESCITALOPRAM OXALATE 10 MG PO TABS
10.0000 mg | ORAL_TABLET | Freq: Every day | ORAL | Status: DC
  Administered 2017-05-17 – 2017-05-18 (×2): 10 mg via ORAL
  Filled 2017-05-16 (×2): qty 1

## 2017-05-16 MED ORDER — BUTALBITAL-APAP-CAFFEINE 50-325-40 MG PO TABS: 1.0000 | ORAL_TABLET | Freq: Two times a day (BID) | ORAL | Status: DC | PRN

## 2017-05-16 MED ORDER — DEXAMETHASONE SODIUM PHOSPHATE 10 MG/ML IJ SOLN
10.0000 mg | Freq: Four times a day (QID) | INTRAMUSCULAR | Status: DC
  Administered 2017-05-16 – 2017-05-18 (×8): 10 mg via INTRAVENOUS
  Filled 2017-05-16 (×9): qty 1

## 2017-05-16 MED ORDER — DEXAMETHASONE SODIUM PHOSPHATE 10 MG/ML IJ SOLN
10.0000 mg | Freq: Once | INTRAMUSCULAR | Status: AC
Start: 2017-05-16 — End: 2017-05-16
  Administered 2017-05-16: 10 mg via INTRAVENOUS
  Filled 2017-05-16: qty 1

## 2017-05-16 MED ORDER — ONDANSETRON HCL 4 MG/2ML IJ SOLN
4.0000 mg | Freq: Once | INTRAMUSCULAR | Status: AC
  Administered 2017-05-16: 4 mg via INTRAVENOUS
  Filled 2017-05-16: qty 2

## 2017-05-16 NOTE — ED Notes (Signed)
Pt unable to use bathroom, bladder can preformed, 207 ml

## 2017-05-16 NOTE — Progress Notes (Signed)
Pt admitted to 157 from ED via stretcher without incident per MD order. VSS. Pt still c/o low back and RLE pain. Pt arrived to the unit after receiving report from [facility] in the ED. Will continue to monitor.

## 2017-05-16 NOTE — ED Provider Notes (Signed)
[facility] Emergency Department Provider Note  ____________________________________________  Time seen: Approximately 1:30 PM  I have reviewed the triage vital signs and the nursing notes.   HISTORY  Chief Complaint Back Pain   HPI Katherine Murray is a 36 y.o. female who presents for evaluation of back pain. Patient reports that she was throwing her daughter who is 43 pounds in the area in the pool week ago when she started having low back pain. She initially thought it was exacerbation of her known DJD. She started taking Tylenol and ibuprofen. The pain got progressively worse. 2 days ago she went to [facility] emergency department and had an MRI which showed multiple degenerative changes most significantly at L5-S1 with grade 1 anterolisthesis and left to severe neural foraminal narrowing. Patient was recommended to follow-up with neurosurgery and instructed to return to the emergency room if her symptoms got worse. Patient reports that since yesterday her right leg has gone numb and she has no sensation to it. She also feels weak on her right leg and has had a fall because the leg gave out. Patient also reports that this morning she was sitting on the couch and became incontinent of urine. She reports that she didn't feel like she had to urinate prior to this accident. No bowel urinary retention, no saddle anesthesia. Patient complaining of severe constant sharp pain located in her lower back at midline/right paraspinal region that radiates down her right leg.  Past Medical History:  Diagnosis Date  . A-fib ([place])   . Cancer ([place])    skin  . Diverticulosis   . Gallstones   . Headache   . Heart rate fast   . Kidney stones   . Lower abdominal pain    Right  . Mitral regurgitation    Mild  . Ovarian cyst 09/16/2016   right measuring 4.4 cm  . Pneumonia   . Tricuspid regurgitation    Trace  . Vaginal infection     Patient Active Problem List   Diagnosis Date Noted  . GAD (generalized anxiety disorder) 02/13/2017  . Preventative health care 10/04/2016  . Hyperlipidemia 10/04/2016  . Pelvic adhesive disease 09/24/2016  . Endometriosis determined by laparoscopy 09/24/2016  . Skin abnormality 08/01/2016  . Tachycardia 08/01/2016  . Encounter for Depo-Provera contraception 08/01/2016  . Migraines 08/01/2016    Past Surgical History:  Procedure Laterality Date  . CESAREAN SECTION    . CHOLECYSTECTOMY    . GALLBLADDER SURGERY    . LAPAROSCOPIC OVARIAN CYSTECTOMY Right 09/24/2016   Procedure: LAPAROSCOPIC RIGHT OVARIAN CYSTECTOMY, Excision of ovarian cyst, Excision of endometeriosis,;  Surgeon: Brayton Mars, MD;  Location: [facility] ORS;  Service: Gynecology;  Laterality: Right;  . TONSILLECTOMY      Prior to Admission medications   Medication Sig Start Date End Date Taking? Authorizing Provider  butalbital-acetaminophen-caffeine (FIORICET, ESGIC) 50-325-40 MG tablet Take 1 tablet by mouth 2 (two) times daily as needed for migraine. 01/24/17  Yes Pleas Koch, NP  escitalopram (LEXAPRO) 10 MG tablet TAKE 1 TABLET(10 MG) BY MOUTH DAILY 02/13/17  Yes Pleas Koch, NP  metoprolol (LOPRESSOR) 50 MG tablet TAKE 1 TABLET(50 MG) BY MOUTH TWICE DAILY 11/23/16  Yes Pleas Koch, NP  oxyCODONE (OXY IR/ROXICODONE) 5 MG immediate release tablet Take 5 mg by mouth every 4 (four) hours as needed for pain. 05/14/17 05/19/17 Yes [provider]    Allergies Lyrica [pregabalin]; Eggs or egg-derived products; and Latex  Family History  Problem Relation Age of Onset  . Colon cancer Paternal Grandmother   . Diabetes Paternal Grandmother   . Ulcerative colitis Unknown   . Kidney disease Unknown   . Stomach cancer Maternal Grandfather   . Diabetes Mother   . Diabetes Father   . Ovarian cancer Maternal Grandmother   . Breast cancer Neg Hx     Social History Social History  Substance Use Topics  . Smoking status:  Current Some Day Smoker    Packs/day: 0.25    Years: 7.00    Types: Cigarettes  . Smokeless tobacco: Never Used     Comment: smokes "once or twice a week"  . Alcohol use Yes     Comment: rare    Review of Systems  Constitutional: Negative for fever. Eyes: Negative for visual changes. ENT: Negative for sore throat. Neck: No neck pain  Cardiovascular: Negative for chest pain. Respiratory: Negative for shortness of breath. Gastrointestinal: Negative for abdominal pain, vomiting or diarrhea. Genitourinary: Negative for dysuria. + urinary incontinence Musculoskeletal: + back pain and RLE weakness and numbness Skin: Negative for rash. Neurological: Negative for headaches, weakness or numbness. Psych: No SI or HI  ____________________________________________   PHYSICAL EXAM:  VITAL SIGNS: ED Triage Vitals  Enc Vitals Group     BP 05/16/17 1042 93/63     Pulse Rate 05/16/17 1042 69     Resp 05/16/17 1042 18     Temp 05/16/17 1042 98.4 F (36.9 C)     Temp Source 05/16/17 1042 Oral     SpO2 05/16/17 1042 99 %     Weight 05/16/17 1044 192 lb (87.1 kg)     Height 05/16/17 1044 5\' 5"  (1.651 m)     Head Circumference --      Peak Flow --      Pain Score 05/16/17 1051 10     Pain Loc --      Pain Edu? --      Excl. in [place]? --     Constitutional: Alert and oriented. Well appearing and in no apparent distress. HEENT:      Head: Normocephalic and atraumatic.         Eyes: Conjunctivae are normal. Sclera is non-icteric.       Mouth/Throat: Mucous membranes are moist.       Neck: Supple with no signs of meningismus. Cardiovascular: Regular rate and rhythm. No murmurs, gallops, or rubs. 2+ symmetrical distal pulses are present in all extremities. No JVD. Respiratory: Normal respiratory effort. Lungs are clear to auscultation bilaterally. No wheezes, crackles, or rhonchi.  Gastrointestinal: Soft, non tender, and non distended with positive bowel sounds. No rebound or  guarding. Musculoskeletal:  no pitting edema, strong distal pulses, brisk capillary refill. Patient is tender to palpation on the midline in the lumbar region and right paraspinal lumbar region. Neurologic: Normal speech and language. Motor shrinks of the right lower extremity is limited due to back pain, patient has 2+ DTRs on Achilles and patella bilaterally, normal rectal tone and sensation. No sensation to pain or touch up to the hip on the R. Normal strength and sensation on the left lower extremity Skin: Skin is warm, dry and intact. No rash noted. Psychiatric: Mood and affect are normal. Speech and behavior are normal.  ____________________________________________   LABS (all labs ordered are listed, but only abnormal results are displayed)  Labs Reviewed  CBC WITH DIFFERENTIAL/PLATELET - Abnormal; Notable for the following:  Result Value   MCH 34.3 (*)    All other components within normal limits  COMPREHENSIVE METABOLIC PANEL - Abnormal; Notable for the following:    Glucose, Bld 104 (*)    Alkaline Phosphatase 37 (*)    All other components within normal limits  URINALYSIS, COMPLETE (UACMP) WITH MICROSCOPIC   ____________________________________________  EKG  none  ____________________________________________  RADIOLOGY  MRI:  1. Small rightward disc herniation at L4-L5 although no direct neural impingement or associated spinal stenosis. Still this might be a source of regional radiculitis; query right L4 and/or L5 radiculitis. 2. Disc degeneration also at L3-L4 with a small right paracentral disc herniation. As in #1, no direct neural impingement. This disc is nearest the right L3 and L4 nerves. 3. Chronic L5-S1 spondylolisthesis and spondylolisthesis appears stable since 2017 with moderate to severe left and mild to moderate right L5 neural foraminal stenosis. 4. Minor L2-L3 disc and facet degeneration without  stenosis. ____________________________________________   PROCEDURES  Procedure(s) performed: None Procedures Critical Care performed:  None ____________________________________________   INITIAL IMPRESSION / ASSESSMENT AND PLAN / ED COURSE  36 y.o. female who presents for evaluation of back pain progressively worsening for a week now with 2 days of right lower extremity weakness, numbness, and one episode of urinary incontinence. Patient has strong reflexes on her bilateral lower extremity, normal rectal tone. She does endorse no sensation to touch or to pain from the sole of her right foot although it to her hip. MRI with no evidence of cauda, did show a small right were disc herniation at L4 and L5 with no neural impingement or spinal stenosis. Also small disc herniation at the level of L3 and L4 with no direct neural impingement. Chronic L5-S1 spondylolisthesis with moderate to severe left and mild to moderate right L5 neuroforaminal stenosis. PVR 207. Patient able to ambulate. Discussed with neurosurgery, Dr. Izora Ribas who evaluated patient's MRI. I discussed my exam with a PVR of 207 and an episode of urinary incontinence but normal reflexes and normal rectal tone. He suggested starting patient on steroids and gabapentin. Recommended admission to the hospitalist for pain management if that is necessary. He will see patient in house and at this time recommended no surgical interventions or no necessity for transferring patient. I discussed with the hospitalist who will admit patient.     Pertinent labs & imaging results that were available during my care of the patient were reviewed by me and considered in my medical decision making (see chart for details).    ____________________________________________   FINAL CLINICAL IMPRESSION(S) / ED DIAGNOSES  Final diagnoses:  Back pain  Lumbar disc herniation with radiculopathy      NEW MEDICATIONS STARTED DURING THIS VISIT:  New  Prescriptions   No medications on file     Note:  This document was prepared using Dragon voice recognition software and may include unintentional dictation errors.    Alfred Levins, [place], MD 05/16/17 [phone]

## 2017-05-16 NOTE — ED Notes (Signed)
Pt returned from MRI, resting in bed in no distress 

## 2017-05-16 NOTE — ED Triage Notes (Signed)
Pt c/o back pain since last week with injury.  Was seen at [facility] and had MRI changes then.  Yesterday right leg gave out and today pt lost bladder control.  Numbness and tingling to both initially but now mostly on right side and to toes.

## 2017-05-16 NOTE — ED Notes (Signed)
Discussed pt with dr Alfred Levins

## 2017-05-16 NOTE — ED Notes (Signed)
Pt ambulated to the toilet using walker

## 2017-05-16 NOTE — H&P (Addendum)
[facility] NAME: Katherine Murray    MR#:  [id number]  DATE OF BIRTH:  [birth year]  DATE OF ADMISSION:  05/16/2017  PRIMARY CARE PHYSICIAN: Pleas Koch, NP   REQUESTING/REFERRING PHYSICIAN: Dr Rudene Re  CHIEF COMPLAINT:   Chief Complaint  Patient presents with  . Back Pain    HISTORY OF PRESENT ILLNESS:  Katherine Murray  is a 36 y.o. female presents back to a hospital setting for the third time. Last Wednesday she was playing with her 5-year-old in the pool and she picked her up and threw her in. She instantly felt back pain at that time. She was helped home and was alternating with Advil and Tylenol. On Saturday she had worsening pain and she started icing it. On Sunday and Monday she had trouble even getting out of bed. She was taken to [facility] and was given a referral to her neurosurgeon after an MRI was given and she was given a prescription of oxycodone. Yesterday her right leg collapsed and she was taken back to the ER in [place] and given prednisone. She only slept 2 hours last night. She's been having decreased appetite. She's has numbness and weakness down the right leg. She lost her urine today. She is also having nausea and vomiting. The ER physician spoke with Dr. Cari Caraway neurosurgery to review the MRI. Hospitalist services were contacted for observation.  PAST MEDICAL HISTORY:   Past Medical History:  Diagnosis Date  . A-fib ([place])   . Cancer ([place])    skin  . Diverticulosis   . Gallstones   . Headache   . Heart rate fast   . Kidney stones   . Lower abdominal pain    Right  . Mitral regurgitation    Mild  . Ovarian cyst 09/16/2016   right measuring 4.4 cm  . Pneumonia   . Tricuspid regurgitation    Trace  . Vaginal infection     PAST SURGICAL HISTORY:   Past Surgical History:  Procedure Laterality Date  . CESAREAN SECTION    . CHOLECYSTECTOMY    . GALLBLADDER SURGERY    .  LAPAROSCOPIC OVARIAN CYSTECTOMY Right 09/24/2016   Procedure: LAPAROSCOPIC RIGHT OVARIAN CYSTECTOMY, Excision of ovarian cyst, Excision of endometeriosis,;  Surgeon: Brayton Mars, MD;  Location: [facility] ORS;  Service: Gynecology;  Laterality: Right;  . TONSILLECTOMY      SOCIAL HISTORY:   Social History  Substance Use Topics  . Smoking status: Current Some Day Smoker    Packs/day: 0.10    Years: 7.00    Types: Cigarettes  . Smokeless tobacco: Never Used     Comment: smokes "once or twice a week"  . Alcohol use Yes     Comment: rare    FAMILY HISTORY:   Family History  Problem Relation Age of Onset  . Colon cancer Paternal Grandmother   . Diabetes Paternal Grandmother   . Ulcerative colitis Unknown   . Kidney disease Unknown   . Stomach cancer Maternal Grandfather   . Diabetes Mother   . Diabetes Father   . Ovarian cancer Maternal Grandmother   . Breast cancer Neg Hx     DRUG ALLERGIES:   Allergies  Allergen Reactions  . Lyrica [Pregabalin] Swelling  . Eggs Or Egg-Derived Products Rash and Swelling  . Latex Rash    REVIEW OF SYSTEMS:  CONSTITUTIONAL: No fever, Right leg weakness. Positive for cold chills EYES: No blurred or double  vision.  EARS, NOSE, AND THROAT: No tinnitus or ear pain. No sore throat RESPIRATORY: No cough, shortness of breath, wheezing or hemoptysis.  CARDIOVASCULAR: No chest pain, orthopnea, edema.  GASTROINTESTINAL: Positive nausea and vomiting. No diarrhea or abdominal pain. No blood in bowel movements. Positive constipation GENITOURINARY: No dysuria, hematuria.  ENDOCRINE: No polyuria, nocturia,  HEMATOLOGY: No anemia, easy bruising or bleeding SKIN: No rash or lesion. MUSCULOSKELETAL: No joint pain or arthritis.   NEUROLOGIC: Positive for numbness and tingling and weakness right leg  PSYCHIATRY: No anxiety or depression.   MEDICATIONS AT HOME:   Prior to Admission medications   Medication Sig Start Date End Date Taking?  Authorizing Provider  butalbital-acetaminophen-caffeine (FIORICET, ESGIC) 50-325-40 MG tablet Take 1 tablet by mouth 2 (two) times daily as needed for migraine. 01/24/17  Yes Pleas Koch, NP  escitalopram (LEXAPRO) 10 MG tablet TAKE 1 TABLET(10 MG) BY MOUTH DAILY 02/13/17  Yes Pleas Koch, NP  metoprolol (LOPRESSOR) 50 MG tablet TAKE 1 TABLET(50 MG) BY MOUTH TWICE DAILY 11/23/16  Yes Pleas Koch, NP  oxyCODONE (OXY IR/ROXICODONE) 5 MG immediate release tablet Take 5 mg by mouth every 4 (four) hours as needed for pain. 05/14/17 05/19/17 Yes [provider]      VITAL SIGNS:  Blood pressure 107/60, pulse 69, temperature 98.4 F (36.9 C), temperature source Oral, resp. rate 16, height 5\' 5"  (1.651 m), weight 87.1 kg (192 lb), SpO2 100 %.  PHYSICAL EXAMINATION:  GENERAL:  36 y.o.-year-old patient lying in the bed with no acute distress.  EYES: Pupils equal, round, reactive to light and accommodation. No scleral icterus. Extraocular muscles intact.  HEENT: Head atraumatic, normocephalic. Oropharynx and nasopharynx clear.  NECK:  Supple, no jugular venous distention. No thyroid enlargement, no tenderness.  LUNGS: Normal breath sounds bilaterally, no wheezing, rales,rhonchi or crepitation. No use of accessory muscles of respiration.  CARDIOVASCULAR: S1, S2 normal. No murmurs, rubs, or gallops.  ABDOMEN: Soft, nontender, nondistended. Bowel sounds present. No organomegaly or mass. Patient also felt numbness in the lower right abdomen EXTREMITIES: No pedal edema, cyanosis, or clubbing. Pain over right sacroiliac joint NEUROLOGIC: Cranial nerves II through XII are intact. Patient barely able to straight leg raise with her right leg. Sensation to light touch decreased right leg. Gait not checked. Reflexes 3+ right lower extremity 2+ left lower extremity PSYCHIATRIC: The patient is alert and oriented x 3.  SKIN: No rash, lesion, or ulcer.   LABORATORY PANEL:   CBC  Recent  Labs Lab 05/16/17 1105  WBC 7.6  HGB 13.8  HCT 39.4  PLT 219   ------------------------------------------------------------------------------------------------------------------  Chemistries   Recent Labs Lab 05/16/17 1105  NA 139  K 3.9  CL 108  CO2 25  GLUCOSE 104*  BUN 8  CREATININE 0.85  CALCIUM 9.2  AST 33  ALT 30  ALKPHOS 37*  BILITOT 0.5   ------------------------------------------------------------------------------------------------------------------  ---------------------------------------------------------------------------------------  RADIOLOGY:  Mr Lumbar Spine Wo Contrast  Result Date: 05/16/2017 CLINICAL DATA:  36 year old female with sudden onset back pain when playing with daughter in pool last week. Right leg weakness yesterday leading to fall. Decreased sensation in the right leg. EXAM: MRI LUMBAR SPINE WITHOUT CONTRAST TECHNIQUE: Multiplanar, multisequence MR imaging of the lumbar spine was performed. No intravenous contrast was administered. COMPARISON:  CT Abdomen and Pelvis 09/16/2016, and earlier. FINDINGS: Segmentation:  Normal as demonstrated on the comparison CT. Alignment: Chronic grade 1 anterolisthesis measuring 5-6 mm appears stable since 2017. Superimposed mild retrolisthesis of L4  on L5. Stable and normal vertebral height and alignment elsewhere. Vertebrae: Chronic bilateral L5 pars fractures. No marrow edema or evidence of acute osseous abnormality. Visualized bone marrow signal is within normal limits. Visible sacrum and SI joints are intact. Conus medullaris: Extends to the L1 level and appears normal. Capacious spinal canal. Paraspinal and other soft tissues: Negative. Disc levels: T11-T12:  Negative. T12-L1:  Negative. L1-L2:  Negative. L2-L3: Disc desiccation, mild disc space loss and circumferential disc bulge. Mild broad-based posterior component. Mild facet hypertrophy. No stenosis. L3-L4: Disc desiccation and mild disc space loss.  Broad-based slightly right eccentric posterior disc protrusion (series 8, image 21). Mild facet hypertrophy mostly on the left. No spinal or lateral recess stenosis. No L3 foraminal stenosis. L4-L5: Mild retrolisthesis. Mild facet hypertrophy. Central to subarticular disc protrusion with midline annular fissure (series 8, image 27). Underlying disc bulge. Right eccentric endplate spurring. No spinal, and no convincing lateral recess stenosis. Only mild right greater than left L4 foraminal stenosis, with direct neural impingement related to the disc. L5-S1: Chronic L5 pars fractures and grade 1 anterolisthesis with severe disc space loss and chronic circumferential disc osteophyte complex. Broad-based posterior component. Mild to moderate facet hypertrophy. No spinal or lateral recess stenosis. Moderate to severe left and mild to moderate right L5 foraminal stenosis which appear stable since the prior CT. IMPRESSION: 1. Small rightward disc herniation at L4-L5 although no direct neural impingement or associated spinal stenosis. Still this might be a source of regional radiculitis; query right L4 and/or L5 radiculitis. 2. Disc degeneration also at L3-L4 with a small right paracentral disc herniation. As in #1, no direct neural impingement. This disc is nearest the right L3 and L4 nerves. 3. Chronic L5-S1 spondylolisthesis and spondylolisthesis appears stable since 2017 with moderate to severe left and mild to moderate right L5 neural foraminal stenosis. 4. Minor L2-L3 disc and facet degeneration without stenosis. Electronically Signed   By: Genevie Ann M.D.   On: 05/16/2017 13:14      IMPRESSION AND PLAN:   1. Lumbar radiculopathy of right leg with numbness and weakness. Pain in right sacroiliac joint. Patient was given a dose of gabapentin in the ER but need to be careful with this with Lyrica allergy. Give high-dose Decadron. Case discussed with neurosurgery too evaluate the patient in follow-up as outpatient.  Pain control with oral medications. Physical therapy evaluation. 2. History of atrial fibrillation. On metoprolol 3. History of endometriosis 4. History of migraine on Fioricet when necessary 5. Tobacco abuse smoking cessation counseling done 4 minutes by me. No need for nicotine patch since she only smokes one to 2 cigarettes a day  All the records are reviewed and case discussed with ED provider. Management plans discussed with the patient, family and they are in agreement.  CODE STATUS: Full code  TOTAL TIME TAKING CARE OF THIS PATIENT: 50 minutes.    Loletha Grayer M.D on 05/16/2017 at 3:43 PM  Between 7am to 6pm - Pager - [phone]  After 6pm call admission pager [phone]  [facility]  [phone]  CC: Primary care physician; Pleas Koch, NP

## 2017-05-16 NOTE — Progress Notes (Signed)
Rept to Dr. Leslye Peer that pt repts oxycodone 5 mg did not help her pain. Pt also no repts no void since her incontinent episode this AM at home. Pt repts poor PO due to nausea/vomiting related to back pain for 1 week. Orders received. Will continue to monitor.

## 2017-05-16 NOTE — Consult Note (Addendum)
Referring Physician:  No referring provider defined for this encounter.  Primary Physician:  Pleas Koch, NP  Chief Complaint:  BLE pain, numbness, rule out cauda equina syndrome  History of Present Illness: Katherine Murray is a 36 y.o. female who presents with the chief complaint of BLE numbness. She has come to the hospital several times over the past week.  8 days ago, she was playing with her 37 year old in the pool when she threw her in and had significant pain at that time.  She had worsening pain over the next several days, so went to the hospital.  She had an MRI, which showed some findings that generated a neurosurgery referral.    Yesterday she developed RLE weakness and returned to medical attention today with pain down her right leg. She also had an accident and wet herself today without realizing it.  She also reports nausea with eating over the past 2 days.   She denies inability to feel her private area, but does have tingling into her groin.  The ER physician performed DRE, showing complete sensation and presence of tone.  Katherine Murray has no symptoms of cervical myelopathy.  The symptoms are causing a significant impact on the patient's life.   Review of Systems:  A 10 point review of systems is negative, except for the pertinent positives and negatives detailed in the HPI.  Past Medical History: Past Medical History:  Diagnosis Date  . A-fib ([place])   . Cancer ([place])    skin  . Diverticulosis   . Gallstones   . Headache   . Heart rate fast   . Kidney stones   . Lower abdominal pain    Right  . Mitral regurgitation    Mild  . Ovarian cyst 09/16/2016   right measuring 4.4 cm  . Pneumonia   . Tricuspid regurgitation    Trace  . Vaginal infection     Past Surgical History: Past Surgical History:  Procedure Laterality Date  . CESAREAN SECTION    . CHOLECYSTECTOMY    . GALLBLADDER SURGERY    . LAPAROSCOPIC OVARIAN CYSTECTOMY Right 09/24/2016   Procedure: LAPAROSCOPIC RIGHT OVARIAN CYSTECTOMY, Excision of ovarian cyst, Excision of endometeriosis,;  Surgeon: Brayton Mars, MD;  Location: [facility] ORS;  Service: Gynecology;  Laterality: Right;  . TONSILLECTOMY      Allergies: Allergies as of 05/16/2017 - Review Complete 05/16/2017  Allergen Reaction Noted  . Lyrica [pregabalin] Swelling 04/04/2014  . Eggs or egg-derived products Rash and Swelling 04/21/2014  . Latex Rash 04/21/2014    Medications:  Current Facility-Administered Medications:  .  acetaminophen (TYLENOL) tablet 650 mg, 650 mg, Oral, Q6H PRN **OR** acetaminophen (TYLENOL) suppository 650 mg, 650 mg, Rectal, Q6H PRN, Wieting, Richard, MD .  butalbital-acetaminophen-caffeine (FIORICET, ESGIC) 50-325-40 MG per tablet 1 tablet, 1 tablet, Oral, BID PRN, Leslye Peer, Richard, MD .  dexamethasone (DECADRON) injection 10 mg, 10 mg, Intravenous, Q6H, Wieting, Richard, MD .  enoxaparin (LOVENOX) injection 40 mg, 40 mg, Subcutaneous, Q24H, Wieting, Richard, MD .  Derrill Memo ON 05/17/2017] escitalopram (LEXAPRO) tablet 10 mg, 10 mg, Oral, Daily, Wieting, Richard, MD .  gabapentin (NEURONTIN) capsule 300 mg, 300 mg, Oral, Once, Alfred Levins, [place], MD .  Derrill Memo ON 05/17/2017] gabapentin (NEURONTIN) capsule 300 mg, 300 mg, Oral, BID, Wieting, Richard, MD .  metoprolol tartrate (LOPRESSOR) tablet 50 mg, 50 mg, Oral, BID, Wieting, Richard, MD .  oxyCODONE (Oxy IR/ROXICODONE) immediate release tablet 5 mg, 5 mg, Oral,  Q4H PRN, Loletha Grayer, MD  Current Outpatient Prescriptions:  .  butalbital-acetaminophen-caffeine (FIORICET, ESGIC) 50-325-40 MG tablet, Take 1 tablet by mouth 2 (two) times daily as needed for migraine., Disp: 30 tablet, Rfl: 0 .  escitalopram (LEXAPRO) 10 MG tablet, TAKE 1 TABLET(10 MG) BY MOUTH DAILY, Disp: 90 tablet, Rfl: 0 .  metoprolol (LOPRESSOR) 50 MG tablet, TAKE 1 TABLET(50 MG) BY MOUTH TWICE DAILY, Disp: 180 tablet, Rfl: 3 .  oxyCODONE (OXY IR/ROXICODONE) 5 MG  immediate release tablet, Take 5 mg by mouth every 4 (four) hours as needed for pain., Disp: , Rfl:    Social History: Social History  Substance Use Topics  . Smoking status: Current Some Day Smoker    Packs/day: 0.10    Years: 7.00    Types: Cigarettes  . Smokeless tobacco: Never Used     Comment: smokes "once or twice a week"  . Alcohol use Yes     Comment: rare    Family Medical History: Family History  Problem Relation Age of Onset  . Colon cancer Paternal Grandmother   . Diabetes Paternal Grandmother   . Ulcerative colitis Unknown   . Kidney disease Unknown   . Stomach cancer Maternal Grandfather   . Diabetes Mother   . Diabetes Father   . Ovarian cancer Maternal Grandmother   . Breast cancer Neg Hx     Physical Examination: Vitals:   05/16/17 1626 05/16/17 1630  BP:  107/71  Pulse: 76 80  Resp:    Temp:       General: Patient is well developed, well nourished, calm, collected, and in mild distress.  Psychiatric: Patient is non-anxious.  Head:  Pupils equal, round, and reactive to light.  ENT:  Oral mucosa appears well hydrated.  Neck:   Supple.  Full range of motion.  Respiratory: Patient is breathing without any difficulty.  Extremities: No edema.  Vascular: Palpable pulses in dorsal pedal vessels.  Skin:   On exposed skin, there are no abnormal skin lesions.  NEUROLOGICAL:  General: In no acute distress.   Awake, alert, oriented to person, place, and time.  Pupils equal round and reactive to light.  Facial tone is symmetric.  Tongue protrusion is midline.  There is no pronator drift.  ROM of spine: untested.  Strength: Side Biceps Triceps Deltoid Interossei Grip Wrist Ext. Wrist Flex.  R 5 5 5 5 5 5 5   L 5 5 5 5 5 5 5    Side Iliopsoas Quads Hamstring PF DF EHL  R 5 4 4- 3 3 5   L 5 5 5 5 5 5    Reflexes are 3+ and symmetric at the biceps, triceps, brachioradialis, patella and achilles.   Patellar reflex is elicitable with suprapatellar  stimulation. Bilateral upper extremity sensation is intact to light touch.  Diminished light touch sensation  in the entire R and L leg. Clonus is present (3 beats B).  Toes are up-going on the left.  Gait is untested.  Hoffman's is present.  Imaging: IMPRESSION: 1. Small rightward disc herniation at L4-L5 although no direct neural impingement or associated spinal stenosis. Still this might be a source of regional radiculitis; query right L4 and/or L5 radiculitis. 2. Disc degeneration also at L3-L4 with a small right paracentral disc herniation. As in #1, no direct neural impingement. This disc is nearest the right L3 and L4 nerves. 3. Chronic L5-S1 spondylolisthesis and spondylolisthesis appears stable since 2017 with moderate to severe left and mild to moderate right L5  neural foraminal stenosis. 4. Minor L2-L3 disc and facet degeneration without stenosis.   Electronically Signed   By: Genevie Ann M.D.   On: 05/16/2017 13:14  I have personally reviewed the images and agree with the above interpretation.  Assessment and Plan: Katherine Murray is a pleasant 36 y.o. female with multiple complaints including weakness, pain respecting a R L5 dermatome, and BLE numbness.  She also has diffuse hyperreflexia. She has no compressive lesion that could cause cauda equina syndrome.    It is unclear to me what the underlying diagnosis of this patient is.  I think further investigation is warranted, including neuroaxis MRI. I would also recommend Neurology consultation.   Differential diagnosis includes cervical or thoracic myelopathy, lumbar radiculopathy, some combination of those two syndromes, other neuroinflammatory condition, acute back pain due to soft tissue injury, and any variety of other possible causes.  She does have anterolisthesis of L5/S1 and a L4/5 disc herniation, but this doesn't seem to explain all that is going on.  For her radicular pain, I would recommend steroids, physical therapy,  outpatient pain management or physiatry referral, lumbar spine flexion and extension radiographs, and follow-up in neurosurgery clinic.  If additional findings arise on her MRI scan, please feel free to get in touch with me or one of my partners.  Katherine Donovan K. Izora Ribas MD, [facility]

## 2017-05-16 NOTE — ED Notes (Signed)
Pt states last week she was playing in the pool with her daughter and had sudden onset of increased back pain, hx of DJD, states she was seen in ED and was told she had damaged some disc, pt was referred to neuro surgrey, states yesterday her right leg gave out and she fell, states this AM she had an episode of urinary incontinence while sitting on the couch, pt states decreased sensation in her right leg, leg is turned outward, cap refill <3 secs, warm and dry

## 2017-05-16 NOTE — ED Notes (Signed)
EDP at bedside  

## 2017-05-16 NOTE — ED Notes (Signed)
Pt in MRI.

## 2017-05-17 DIAGNOSIS — M5116 Intervertebral disc disorders with radiculopathy, lumbar region: Secondary | ICD-10-CM

## 2017-05-17 LAB — URINALYSIS, COMPLETE (UACMP) WITH MICROSCOPIC
BACTERIA UA: NONE SEEN
BILIRUBIN URINE: NEGATIVE
Glucose, UA: NEGATIVE mg/dL
KETONES UR: NEGATIVE mg/dL
LEUKOCYTES UA: NEGATIVE
Nitrite: NEGATIVE
Protein, ur: NEGATIVE mg/dL
Specific Gravity, Urine: 1.026 (ref 1.005–1.030)
pH: 5 (ref 5.0–8.0)

## 2017-05-17 LAB — CBC
HCT: 37.4 % (ref 35.0–47.0)
HEMOGLOBIN: 13.1 g/dL (ref 12.0–16.0)
MCH: 34.9 pg — AB (ref 26.0–34.0)
MCHC: 35.1 g/dL (ref 32.0–36.0)
MCV: 99.2 fL (ref 80.0–100.0)
PLATELETS: 194 10*3/uL (ref 150–440)
RBC: 3.77 MIL/uL — AB (ref 3.80–5.20)
RDW: 12.8 % (ref 11.5–14.5)
WBC: 4.5 10*3/uL (ref 3.6–11.0)

## 2017-05-17 LAB — BASIC METABOLIC PANEL
ANION GAP: 5 (ref 5–15)
BUN: 10 mg/dL (ref 6–20)
CALCIUM: 9 mg/dL (ref 8.9–10.3)
CO2: 24 mmol/L (ref 22–32)
CREATININE: 0.64 mg/dL (ref 0.44–1.00)
Chloride: 106 mmol/L (ref 101–111)
Glucose, Bld: 162 mg/dL — ABNORMAL HIGH (ref 65–99)
Potassium: 4.2 mmol/L (ref 3.5–5.1)
SODIUM: 135 mmol/L (ref 135–145)

## 2017-05-17 NOTE — Progress Notes (Signed)
PT Cancellation Note  Patient Details Name: Katherine Murray MRN: [id number] DOB: 05-Mar-1981   Cancelled Treatment:    Reason Eval/Treat Not Completed: Other (comment). PT attempted evaluation but pt reported neurologist was just in and told pt he wanted to speak with Dr. Manuella Ghazi before pt ambulates. RN was not aware, PT will re-attempt eval at later time, once pt is cleared for amb.  Geoffry Paradise, PT,DPT 05/17/17 10:47 AM    Katherine Murray 05/17/2017, 10:46 AM

## 2017-05-17 NOTE — Progress Notes (Signed)
PT Cancellation Note  Patient Details Name: Katherine Murray MRN: [id number] DOB: 1981/05/16   Cancelled Treatment:    Reason Eval/Treat Not Completed: Other (comment). PT spoke with RN prior to re-attempting eval this afternoon. RN had not yet spoken to MD to get clarification regarding pt's ability to participate in therapy, as pt reports neurologist told pt she should wait to ambulate. RN suggested trying back tomorrow, once we have clarification from MD.  Geoffry Paradise, PT,DPT 05/17/17 3:39 PM    Miller,Jennifer L 05/17/2017, 3:38 PM

## 2017-05-17 NOTE — Progress Notes (Signed)
Pt has not voided since her incontinent episode at home last am. Bladder scanned pt and notified Dr Marcille Blanco of results. 430 in bladder. No new orders was received. Encouraged pt to drink fluids. Will continue to monitor

## 2017-05-17 NOTE — Progress Notes (Addendum)
Paged and spoke to Dr. Manuella Ghazi. Advised him of bladder scan showing 965mL of urine. Ordered In/Out cath which removed 1052mL.

## 2017-05-17 NOTE — Consult Note (Signed)
Reason for Consult:back pain  Referring Physician: Dr. Manuella Ghazi  CC: back pain   HPI: Katherine Murray is an 36 y.o. female presents with back pain after being seen multiple times at [facility]. On Saturday she had worsening pain and she started icing it. On Sunday and Monday she had trouble even getting out of bed. She was taken to [facility] and was given a referral to her neurosurgeon after an MRI was given and she was given a prescription of oxycodone. Yesterday her right leg collapsed and she was taken back to the ER in [place] and given prednisone.  Imaging showed  R disk herniation at L4-L5   Past Medical History:  Diagnosis Date  . A-fib ([place])   . Cancer ([place])    skin  . Diverticulosis   . Gallstones   . Headache   . Heart rate fast   . Kidney stones   . Lower abdominal pain    Right  . Mitral regurgitation    Mild  . Ovarian cyst 09/16/2016   right measuring 4.4 cm  . Pneumonia   . Tricuspid regurgitation    Trace  . Vaginal infection     Past Surgical History:  Procedure Laterality Date  . CESAREAN SECTION    . CHOLECYSTECTOMY    . GALLBLADDER SURGERY    . LAPAROSCOPIC OVARIAN CYSTECTOMY Right 09/24/2016   Procedure: LAPAROSCOPIC RIGHT OVARIAN CYSTECTOMY, Excision of ovarian cyst, Excision of endometeriosis,;  Surgeon: Brayton Mars, MD;  Location: [facility] ORS;  Service: Gynecology;  Laterality: Right;  . TONSILLECTOMY      Family History  Problem Relation Age of Onset  . Colon cancer Paternal Grandmother   . Diabetes Paternal Grandmother   . Ulcerative colitis Unknown   . Kidney disease Unknown   . Stomach cancer Maternal Grandfather   . Diabetes Mother   . Diabetes Father   . Ovarian cancer Maternal Grandmother   . Breast cancer Neg Hx     Social History:  reports that she has been smoking Cigarettes.  She has a 0.70 pack-year smoking history. She has never used smokeless tobacco. She reports that she drinks alcohol. She reports that she does not  use drugs.  Allergies  Allergen Reactions  . Lyrica [Pregabalin] Swelling  . Eggs Or Egg-Derived Products Rash and Swelling  . Latex Rash    Medications: I have reviewed the patient's current medications.  ROS: History obtained from the patient  General ROS: negative for - chills, fatigue, fever, night sweats, weight gain or weight loss Psychological ROS: negative for - behavioral disorder, hallucinations, memory difficulties, mood swings or suicidal ideation Ophthalmic ROS: negative for - blurry vision, double vision, eye pain or loss of vision ENT ROS: negative for - epistaxis, nasal discharge, oral lesions, sore throat, tinnitus or vertigo Allergy and Immunology ROS: negative for - hives or itchy/watery eyes Hematological and Lymphatic ROS: negative for - bleeding problems, bruising or swollen lymph nodes Endocrine ROS: negative for - galactorrhea, hair pattern changes, polydipsia/polyuria or temperature intolerance Respiratory ROS: negative for - cough, hemoptysis, shortness of breath or wheezing Cardiovascular ROS: negative for - chest pain, dyspnea on exertion, edema or irregular heartbeat Gastrointestinal ROS: negative for - abdominal pain, diarrhea, hematemesis, nausea/vomiting or stool incontinence Genito-Urinary ROS: negative for - dysuria, hematuria, incontinence or urinary frequency/urgency Musculoskeletal ROS: negative for - joint swelling or muscular weakness Neurological ROS: as noted in HPI Dermatological ROS: negative for rash and skin lesion changes  Physical Examination: Blood pressure  114/77, pulse 72, temperature 98 F (36.7 C), temperature source Oral, resp. rate 20, height 5\' 5"  (1.651 m), weight 87.1 kg (192 lb), SpO2 99 %.    Neurological Examination   Mental Status: Alert, oriented, thought content appropriate.  Speech fluent without evidence of aphasia.  Able to follow 3 step commands without difficulty. Cranial Nerves: II: Discs flat bilaterally;  Visual fields grossly normal, pupils equal, round, reactive to light and accommodation III,IV, VI: ptosis not present, extra-ocular motions intact bilaterally V,VII: smile symmetric, facial light touch sensation normal bilaterally VIII: hearing normal bilaterally IX,X: gag reflex present XI: bilateral shoulder shrug XII: midline tongue extension Motor: Right : Upper extremity   5/5    Left:     Upper extremity   5/5  Lower extremity   3/5     Lower extremity   5/5 Tone and bulk:normal tone throughout; no atrophy noted Sensory: Pinprick and light touch intact throughout, bilaterally Deep Tendon Reflexes: 2+ and symmetric throughout Plantars: Right: downgoing   Left: downgoing Cerebellar: normal finger-to-nose, normal rapid alternating movements and normal heel-to-shin test Gait: not tested       Laboratory Studies:   Basic Metabolic Panel:  Recent Labs Lab 05/16/17 1105 05/17/17 0508  NA 139 135  K 3.9 4.2  CL 108 106  CO2 25 24  GLUCOSE 104* 162*  BUN 8 10  CREATININE 0.85 0.64  CALCIUM 9.2 9.0    Liver Function Tests:  Recent Labs Lab 05/16/17 1105  AST 33  ALT 30  ALKPHOS 37*  BILITOT 0.5  PROT 7.0  ALBUMIN 4.1   No results for input(s): LIPASE, AMYLASE in the last 168 hours. No results for input(s): AMMONIA in the last 168 hours.  CBC:  Recent Labs Lab 05/16/17 1105 05/17/17 0508  WBC 7.6 4.5  NEUTROABS 4.3  --   HGB 13.8 13.1  HCT 39.4 37.4  MCV 98.4 99.2  PLT 219 194    Cardiac Enzymes: No results for input(s): CKTOTAL, CKMB, CKMBINDEX, TROPONINI in the last 168 hours.  BNP: Invalid input(s): POCBNP  CBG: No results for input(s): GLUCAP in the last 168 hours.  Microbiology: Results for orders placed or performed during the hospital encounter of 09/16/16  Wet prep, genital     Status: Abnormal   Collection Time: 09/16/16  7:07 PM  Result Value Ref Range Status   Yeast Wet Prep HPF POC NONE SEEN NONE SEEN Final   Trich, Wet Prep  NONE SEEN NONE SEEN Final   Clue Cells Wet Prep HPF POC PRESENT (A) NONE SEEN Final   WBC, Wet Prep HPF POC MANY (A) NONE SEEN Final    Comment: Specimen diluted due to transport tube containing more than 1 ml of saline, interpret results with caution.   Sperm NONE SEEN  Final  Chlamydia/NGC rt PCR ([place] only)     Status: Abnormal   Collection Time: 09/16/16  7:07 PM  Result Value Ref Range Status   Specimen source GC/Chlam ENDOCERVICAL  Final   Chlamydia Tr NOT DETECTED NOT DETECTED Final   N gonorrhoeae DETECTED (A) NOT DETECTED Final    Comment: (NOTE) 100  This methodology has not been evaluated in pregnant women or in 200  patients with a history of hysterectomy. 300 400  This methodology will not be performed on patients less than 26  years of age.     Coagulation Studies: No results for input(s): LABPROT, INR in the last 72 hours.  Urinalysis: No results for input(s):  COLORURINE, LABSPEC, [place], GLUCOSEU, HGBUR, BILIRUBINUR, KETONESUR, PROTEINUR, UROBILINOGEN, NITRITE, LEUKOCYTESUR in the last 168 hours.  Invalid input(s): APPERANCEUR  Lipid Panel:     Component Value Date/Time   CHOL 219 (H) 10/02/2016 0823   CHOL 149 08/28/2014 0439   TRIG 252 (H) 10/02/2016 0823   TRIG 278 (H) 08/28/2014 0439   HDL 42 10/02/2016 0823   HDL 22 (L) 08/28/2014 0439   CHOLHDL 5.2 10/02/2016 0823   VLDL 50 (H) 10/02/2016 0823   VLDL 56 (H) 08/28/2014 0439   LDLCALC 127 (H) 10/02/2016 0823   LDLCALC 71 08/28/2014 0439    HgbA1C: No results found for: HGBA1C  Urine Drug Screen:  No results found for: LABOPIA, COCAINSCRNUR, LABBENZ, AMPHETMU, THCU, LABBARB  Alcohol Level: No results for input(s): ETH in the last 168 hours.  Other results: EKG: normal EKG, normal sinus rhythm, unchanged from previous tracings.  Imaging: Mr Thoracic Spine Wo Contrast  Result Date: 05/16/2017 CLINICAL DATA:  Mid thoracic pain EXAM: MRI THORACIC SPINE WITHOUT CONTRAST TECHNIQUE: Multiplanar,  multisequence MR imaging of the thoracic spine was performed. No intravenous contrast was administered. COMPARISON:  Chest radiograph 02/11/2017 FINDINGS: Alignment:  Physiologic. Vertebrae: No fracture, evidence of discitis, or bone lesion. Cord:  Normal signal and morphology. Paraspinal and other soft tissues: Negative. Disc levels: No disc herniation, spinal canal stenosis or neural foraminal stenosis. IMPRESSION: Normal thoracic spine. Electronically Signed   By: Ulyses Jarred M.D.   On: 05/16/2017 21:17   Mr Lumbar Spine Wo Contrast  Result Date: 05/16/2017 CLINICAL DATA:  36 year old female with sudden onset back pain when playing with daughter in pool last week. Right leg weakness yesterday leading to fall. Decreased sensation in the right leg. EXAM: MRI LUMBAR SPINE WITHOUT CONTRAST TECHNIQUE: Multiplanar, multisequence MR imaging of the lumbar spine was performed. No intravenous contrast was administered. COMPARISON:  CT Abdomen and Pelvis 09/16/2016, and earlier. FINDINGS: Segmentation:  Normal as demonstrated on the comparison CT. Alignment: Chronic grade 1 anterolisthesis measuring 5-6 mm appears stable since 2017. Superimposed mild retrolisthesis of L4 on L5. Stable and normal vertebral height and alignment elsewhere. Vertebrae: Chronic bilateral L5 pars fractures. No marrow edema or evidence of acute osseous abnormality. Visualized bone marrow signal is within normal limits. Visible sacrum and SI joints are intact. Conus medullaris: Extends to the L1 level and appears normal. Capacious spinal canal. Paraspinal and other soft tissues: Negative. Disc levels: T11-T12:  Negative. T12-L1:  Negative. L1-L2:  Negative. L2-L3: Disc desiccation, mild disc space loss and circumferential disc bulge. Mild broad-based posterior component. Mild facet hypertrophy. No stenosis. L3-L4: Disc desiccation and mild disc space loss. Broad-based slightly right eccentric posterior disc protrusion (series 8, image 21).  Mild facet hypertrophy mostly on the left. No spinal or lateral recess stenosis. No L3 foraminal stenosis. L4-L5: Mild retrolisthesis. Mild facet hypertrophy. Central to subarticular disc protrusion with midline annular fissure (series 8, image 27). Underlying disc bulge. Right eccentric endplate spurring. No spinal, and no convincing lateral recess stenosis. Only mild right greater than left L4 foraminal stenosis, with direct neural impingement related to the disc. L5-S1: Chronic L5 pars fractures and grade 1 anterolisthesis with severe disc space loss and chronic circumferential disc osteophyte complex. Broad-based posterior component. Mild to moderate facet hypertrophy. No spinal or lateral recess stenosis. Moderate to severe left and mild to moderate right L5 foraminal stenosis which appear stable since the prior CT. IMPRESSION: 1. Small rightward disc herniation at L4-L5 although no direct neural impingement or associated spinal  stenosis. Still this might be a source of regional radiculitis; query right L4 and/or L5 radiculitis. 2. Disc degeneration also at L3-L4 with a small right paracentral disc herniation. As in #1, no direct neural impingement. This disc is nearest the right L3 and L4 nerves. 3. Chronic L5-S1 spondylolisthesis and spondylolisthesis appears stable since 2017 with moderate to severe left and mild to moderate right L5 neural foraminal stenosis. 4. Minor L2-L3 disc and facet degeneration without stenosis. Electronically Signed   By: Genevie Ann M.D.   On: 05/16/2017 13:14     Assessment/Plan:  36 y.o. female presents with back pain after being seen multiple times at [facility]. On Saturday she had worsening pain and she started icing it. On Sunday and Monday she had trouble even getting out of bed. She was taken to [facility] and was given a referral to her neurosurgeon after an MRI was given and she was given a prescription of oxycodone. Yesterday her right leg collapsed and she was  taken back to the ER in [place] and given prednisone.  Imaging showed  R disk herniation at L4-L5   On examination pt is complaining of weakness in the RLE and states unable to move it to pain - think there is component of seeking medications - Neurosurgery stated no need for acute intervention therefore I don't see role for acute steroids - Pain management - EMG as out pt in 2-3 weeks.  Leotis Pain  05/17/2017, 10:41 AM

## 2017-05-17 NOTE — Progress Notes (Signed)
[place] at [place] NAME: Katherine Murray    MR#:  [id number]  DATE OF BIRTH:  Jun 04, 1981  SUBJECTIVE:  CHIEF COMPLAINT:   Chief Complaint  Patient presents with  . Back Pain  She mentions she has not voided since yesterday, 7:30 AM and does not have urge.  As of now, she is not urinary incontinent while in the hospital.  She reports she has been drinking a lot of fluids but has not had much urine.  Still having pain and dilemma/frustration about her condition.  Now also reporting some pain radiation between her shoulder blades in the back REVIEW OF SYSTEMS:  Review of Systems  Constitutional: Negative for chills, fever and weight loss.  HENT: Negative for nosebleeds and sore throat.   Eyes: Negative for blurred vision.  Respiratory: Negative for cough, shortness of breath and wheezing.   Cardiovascular: Negative for chest pain, orthopnea, leg swelling and PND.  Gastrointestinal: Negative for abdominal pain, constipation, diarrhea, heartburn, nausea and vomiting.  Genitourinary: Negative for dysuria and urgency.  Musculoskeletal: Positive for back pain.  Skin: Negative for rash.  Neurological: Negative for dizziness, speech change, focal weakness and headaches.  Endo/Heme/Allergies: Does not bruise/bleed easily.  Psychiatric/Behavioral: Negative for depression.    DRUG ALLERGIES:   Allergies  Allergen Reactions  . Lyrica [Pregabalin] Swelling  . Eggs Or Egg-Derived Products Rash and Swelling  . Latex Rash   VITALS:  Blood pressure 114/77, pulse 72, temperature 98 F (36.7 C), temperature source Oral, resp. rate 20, height 5\' 5"  (1.651 m), weight 87.1 kg (192 lb), SpO2 99 %. PHYSICAL EXAMINATION:  Physical Exam  Constitutional: She is oriented to person, place, and time and well-developed, well-nourished, and in no distress.  HENT:  Head: Normocephalic and atraumatic.  Eyes: Conjunctivae and EOM are normal. Pupils are equal,  round, and reactive to light.  Neck: Normal range of motion. Neck supple. No tracheal deviation present. No thyromegaly present.  Cardiovascular: Normal rate, regular rhythm and normal heart sounds.   Pulmonary/Chest: Effort normal and breath sounds normal. No respiratory distress. She has no wheezes. She exhibits no tenderness.  Abdominal: Soft. Bowel sounds are normal. She exhibits no distension. There is no tenderness.  Musculoskeletal: Normal range of motion.  Neurological: She is alert and oriented to person, place, and time. No cranial nerve deficit.  Skin: Skin is warm and dry. No rash noted.  Psychiatric: Mood and affect normal.   LABORATORY PANEL:  Female CBC  Recent Labs Lab 05/17/17 0508  WBC 4.5  HGB 13.1  HCT 37.4  PLT 194   ------------------------------------------------------------------------------------------------------------------ Chemistries   Recent Labs Lab 05/16/17 1105 05/17/17 0508  NA 139 135  K 3.9 4.2  CL 108 106  CO2 25 24  GLUCOSE 104* 162*  BUN 8 10  CREATININE 0.85 0.64  CALCIUM 9.2 9.0  AST 33  --   ALT 30  --   ALKPHOS 37*  --   BILITOT 0.5  --    RADIOLOGY:  Mr Thoracic Spine Wo Contrast  Result Date: 05/16/2017 CLINICAL DATA:  Mid thoracic pain EXAM: MRI THORACIC SPINE WITHOUT CONTRAST TECHNIQUE: Multiplanar, multisequence MR imaging of the thoracic spine was performed. No intravenous contrast was administered. COMPARISON:  Chest radiograph 02/11/2017 FINDINGS: Alignment:  Physiologic. Vertebrae: No fracture, evidence of discitis, or bone lesion. Cord:  Normal signal and morphology. Paraspinal and other soft tissues: Negative. Disc levels: No disc herniation, spinal canal stenosis or neural foraminal stenosis.  IMPRESSION: Normal thoracic spine. Electronically Signed   By: Ulyses Jarred M.D.   On: 05/16/2017 21:17   Mr Lumbar Spine Wo Contrast  Result Date: 05/16/2017 CLINICAL DATA:  36 year old female with sudden onset back pain when  playing with daughter in pool last week. Right leg weakness yesterday leading to fall. Decreased sensation in the right leg. EXAM: MRI LUMBAR SPINE WITHOUT CONTRAST TECHNIQUE: Multiplanar, multisequence MR imaging of the lumbar spine was performed. No intravenous contrast was administered. COMPARISON:  CT Abdomen and Pelvis 09/16/2016, and earlier. FINDINGS: Segmentation:  Normal as demonstrated on the comparison CT. Alignment: Chronic grade 1 anterolisthesis measuring 5-6 mm appears stable since 2017. Superimposed mild retrolisthesis of L4 on L5. Stable and normal vertebral height and alignment elsewhere. Vertebrae: Chronic bilateral L5 pars fractures. No marrow edema or evidence of acute osseous abnormality. Visualized bone marrow signal is within normal limits. Visible sacrum and SI joints are intact. Conus medullaris: Extends to the L1 level and appears normal. Capacious spinal canal. Paraspinal and other soft tissues: Negative. Disc levels: T11-T12:  Negative. T12-L1:  Negative. L1-L2:  Negative. L2-L3: Disc desiccation, mild disc space loss and circumferential disc bulge. Mild broad-based posterior component. Mild facet hypertrophy. No stenosis. L3-L4: Disc desiccation and mild disc space loss. Broad-based slightly right eccentric posterior disc protrusion (series 8, image 21). Mild facet hypertrophy mostly on the left. No spinal or lateral recess stenosis. No L3 foraminal stenosis. L4-L5: Mild retrolisthesis. Mild facet hypertrophy. Central to subarticular disc protrusion with midline annular fissure (series 8, image 27). Underlying disc bulge. Right eccentric endplate spurring. No spinal, and no convincing lateral recess stenosis. Only mild right greater than left L4 foraminal stenosis, with direct neural impingement related to the disc. L5-S1: Chronic L5 pars fractures and grade 1 anterolisthesis with severe disc space loss and chronic circumferential disc osteophyte complex. Broad-based posterior  component. Mild to moderate facet hypertrophy. No spinal or lateral recess stenosis. Moderate to severe left and mild to moderate right L5 foraminal stenosis which appear stable since the prior CT. IMPRESSION: 1. Small rightward disc herniation at L4-L5 although no direct neural impingement or associated spinal stenosis. Still this might be a source of regional radiculitis; query right L4 and/or L5 radiculitis. 2. Disc degeneration also at L3-L4 with a small right paracentral disc herniation. As in #1, no direct neural impingement. This disc is nearest the right L3 and L4 nerves. 3. Chronic L5-S1 spondylolisthesis and spondylolisthesis appears stable since 2017 with moderate to severe left and mild to moderate right L5 neural foraminal stenosis. 4. Minor L2-L3 disc and facet degeneration without stenosis. Electronically Signed   By: Genevie Ann M.D.   On: 05/16/2017 13:14   ASSESSMENT AND PLAN:   1. Lumbar radiculopathy of right leg with numbness and weakness. Pain in right sacroiliac joint.  - Continue high-dose Decadron.  - Pain control with oral medications. Physical therapy evaluation. - Neurosurgery does not feel this is cauda equina syndrome.   - They recommend Neurology consultation has been requested  - She may need outpatient pain management or physiatry referral, and follow-up in neurosurgery clinic .  If nothing obvious here.  2. History of atrial fibrillation. On metoprolol for rate control, not on anticoagulation 3. History of endometriosis: Outpatient evaluation 4. History of migraine on Fioricet when necessary 5. Tobacco abuse smoking cessation counseling done 4 minutes by admitting Dr. No need for nicotine patch since she only smokes one to 2 cigarettes a day     All the records  are reviewed and case discussed with Care Management/Social Worker. Management plans discussed with the patient, family and they are in agreement.  CODE STATUS: Full Code  TOTAL TIME TAKING CARE OF THIS  PATIENT: 35 minutes.   More than 50% of the time was spent in counseling/coordination of care: YES  POSSIBLE D/C IN 1-2 DAYS, DEPENDING ON CLINICAL CONDITION.  And neurology and neurosurgical evaluation   Max Sane M.D on 05/17/2017 at 10:33 AM  Between 7am to 6pm - Pager - [phone]  After 6pm go to [email or web address] - Proofreader  [facility] [place] [facility]  Office  [phone]  CC: Primary care physician; Pleas Koch, NP  Note: This dictation was prepared with Dragon dictation along with smaller phrase technology. Any transcriptional errors that result from this process are unintentional.

## 2017-05-17 NOTE — Plan of Care (Signed)
Problem: Pain Managment: Goal: General experience of comfort will improve Outcome: Not Progressing Pt complains of constant back pain.   Problem: Nutrition: Goal: Adequate nutrition will be maintained Outcome: Progressing Nausea is better. Po intake should improve

## 2017-05-17 NOTE — Progress Notes (Signed)
OT Cancellation Note  Patient Details Name: Katherine Murray MRN: [id number] DOB: February 18, 1981   Cancelled Treatment:    Reason Eval/Treat Not Completed: Patient not medically ready Order received and per chart review, pt reported to PT that her neurologist just assessed her and told pt he wanted to speak with Dr. Manuella Ghazi before pt ambulates. OT will re-attempt eval at later time, once pt is cleared for OOB.   Chrys Racer, OTR/L ascom [phone] 05/17/17, 1:15 PM

## 2017-05-18 DIAGNOSIS — M5116 Intervertebral disc disorders with radiculopathy, lumbar region: Secondary | ICD-10-CM | POA: Diagnosis not present

## 2017-05-18 LAB — CBC
HEMATOCRIT: 36.8 % (ref 35.0–47.0)
Hemoglobin: 13 g/dL (ref 12.0–16.0)
MCH: 34.4 pg — ABNORMAL HIGH (ref 26.0–34.0)
MCHC: 35.3 g/dL (ref 32.0–36.0)
MCV: 97.5 fL (ref 80.0–100.0)
PLATELETS: 198 10*3/uL (ref 150–440)
RBC: 3.77 MIL/uL — ABNORMAL LOW (ref 3.80–5.20)
RDW: 12.9 % (ref 11.5–14.5)
WBC: 7.6 10*3/uL (ref 3.6–11.0)

## 2017-05-18 LAB — BASIC METABOLIC PANEL
Anion gap: 4 — ABNORMAL LOW (ref 5–15)
BUN: 8 mg/dL (ref 6–20)
CALCIUM: 8.7 mg/dL — AB (ref 8.9–10.3)
CO2: 27 mmol/L (ref 22–32)
CREATININE: 0.38 mg/dL — AB (ref 0.44–1.00)
Chloride: 106 mmol/L (ref 101–111)
Glucose, Bld: 125 mg/dL — ABNORMAL HIGH (ref 65–99)
Potassium: 3.8 mmol/L (ref 3.5–5.1)
SODIUM: 137 mmol/L (ref 135–145)

## 2017-05-18 MED ORDER — TAMSULOSIN HCL 0.4 MG PO CAPS
0.4000 mg | ORAL_CAPSULE | Freq: Every day | ORAL | Status: DC
  Administered 2017-05-18: 0.4 mg via ORAL
  Filled 2017-05-18: qty 1

## 2017-05-18 MED ORDER — KETOROLAC TROMETHAMINE 30 MG/ML IJ SOLN: 30.0000 mg | Freq: Three times a day (TID) | INTRAMUSCULAR | Status: DC

## 2017-05-18 MED ORDER — IBUPROFEN 800 MG PO TABS: 800.0000 mg | ORAL_TABLET | Freq: Three times a day (TID) | ORAL | 0 refills | Status: DC | PRN

## 2017-05-18 MED ORDER — DEXAMETHASONE 2 MG PO TABS: 2.0000 mg | ORAL_TABLET | Freq: Three times a day (TID) | ORAL | 0 refills | Status: AC

## 2017-05-18 NOTE — Progress Notes (Signed)
PT Cancellation Note  Patient Details Name: KALISHA KEADLE MRN: [id number] DOB: 10-Jul-1981   Cancelled Treatment:     Patient reports her back pain was so intense she buckled on her R side and had a fall prior to coming to hospital. She reports her pain is now much better, denies any numbness or tingling and is anticipating returning home. She is able to ambulate a full lap around RN station with no buckling or overt loss of balance. She appears back at baseline, no PT needs identified, will sign off.   Royce Macadamia PT, DPT, CSCS    05/18/2017, 2:26 PM

## 2017-05-18 NOTE — Discharge Summary (Signed)
[place] at [place] was admitted to the Hospital on 05/16/2017 and Discharged  05/18/2017 and should be excused from work/school   for 5 days starting 05/16/2017 , may return to work/school without any restrictions.  Call [name] MD with questions.  [name] M.D on 05/18/2017,at 3:56 PM  [place] [facility]  [phone]

## 2017-05-18 NOTE — Discharge Instructions (Signed)
[place] at [place]:  regular  DISCHARGE CONDITION:  stable  ACTIVITY:  As tolerated  OXYGEN:  Home Oxygen: none   Oxygen Delivery: none  DISCHARGE LOCATION:  home   ADDITIONAL DISCHARGE INSTRUCTION:   If you experience worsening of your admission symptoms, develop shortness of breath, life threatening emergency, suicidal or homicidal thoughts you must seek medical attention immediately by calling 911 or calling your MD immediately  if symptoms less severe.  You Must read complete instructions/literature along with all the possible adverse reactions/side effects for all the Medicines you take and that have been prescribed to you. Take any new Medicines after you have completely understood and accpet all the possible adverse reactions/side effects.   Please note  You were cared for by a hospitalist during your hospital stay. If you have any questions about your discharge medications or the care you received while you were in the hospital after you are discharged, you can call the unit and asked to speak with the hospitalist on call if the hospitalist that took care of you is not available. Once you are discharged, your primary care physician will handle any further medical issues. Please note that NO REFILLS for any discharge medications will be authorized once you are discharged, as it is imperative that you return to your primary care physician (or establish a relationship with a primary care physician if you do not have one) for your aftercare needs so that they can reassess your need for medications and monitor your lab values.

## 2017-05-18 NOTE — Progress Notes (Addendum)
Patient is being discharged home today to self care.  Discharge & RX instructions given and patient acknowledged understanding. IV removed and patient belongings packed. Patient's friend will take her home.

## 2017-05-18 NOTE — Consult Note (Signed)
Reason for Consult:back pain  Referring Physician: Dr. Manuella Ghazi  CC: back pain   HPI: Katherine Murray is an 36 y.o. female presents with back pain after being seen multiple times at [facility]. On Saturday she had worsening pain and she started icing it. On Sunday and Monday she had trouble even getting out of bed. She was taken to [facility] and was given a referral to her neurosurgeon after an MRI was given and she was given a prescription of oxycodone. Yesterday her right leg collapsed and she was taken back to the ER in [place] and given prednisone.  Imaging showed  R disk herniation at L4-L5   Past Medical History:  Diagnosis Date  . A-fib ([place])   . Cancer ([place])    skin  . Diverticulosis   . Gallstones   . Headache   . Heart rate fast   . Kidney stones   . Lower abdominal pain    Right  . Mitral regurgitation    Mild  . Ovarian cyst 09/16/2016   right measuring 4.4 cm  . Pneumonia   . Tricuspid regurgitation    Trace  . Vaginal infection     Past Surgical History:  Procedure Laterality Date  . CESAREAN SECTION    . CHOLECYSTECTOMY    . GALLBLADDER SURGERY    . LAPAROSCOPIC OVARIAN CYSTECTOMY Right 09/24/2016   Procedure: LAPAROSCOPIC RIGHT OVARIAN CYSTECTOMY, Excision of ovarian cyst, Excision of endometeriosis,;  Surgeon: Brayton Mars, MD;  Location: [facility] ORS;  Service: Gynecology;  Laterality: Right;  . TONSILLECTOMY      Family History  Problem Relation Age of Onset  . Colon cancer Paternal Grandmother   . Diabetes Paternal Grandmother   . Ulcerative colitis Unknown   . Kidney disease Unknown   . Stomach cancer Maternal Grandfather   . Diabetes Mother   . Diabetes Father   . Ovarian cancer Maternal Grandmother   . Breast cancer Neg Hx     Social History:  reports that she has been smoking Cigarettes.  She has a 0.70 pack-year smoking history. She has never used smokeless tobacco. She reports that she drinks alcohol. She reports that she does not  use drugs.  Allergies  Allergen Reactions  . Lyrica [Pregabalin] Swelling  . Eggs Or Egg-Derived Products Rash and Swelling  . Latex Rash    Medications: I have reviewed the patient's current medications.  ROS: History obtained from the patient  General ROS: negative for - chills, fatigue, fever, night sweats, weight gain or weight loss Psychological ROS: negative for - behavioral disorder, hallucinations, memory difficulties, mood swings or suicidal ideation Ophthalmic ROS: negative for - blurry vision, double vision, eye pain or loss of vision ENT ROS: negative for - epistaxis, nasal discharge, oral lesions, sore throat, tinnitus or vertigo Allergy and Immunology ROS: negative for - hives or itchy/watery eyes Hematological and Lymphatic ROS: negative for - bleeding problems, bruising or swollen lymph nodes Endocrine ROS: negative for - galactorrhea, hair pattern changes, polydipsia/polyuria or temperature intolerance Respiratory ROS: negative for - cough, hemoptysis, shortness of breath or wheezing Cardiovascular ROS: negative for - chest pain, dyspnea on exertion, edema or irregular heartbeat Gastrointestinal ROS: negative for - abdominal pain, diarrhea, hematemesis, nausea/vomiting or stool incontinence Genito-Urinary ROS: negative for - dysuria, hematuria, incontinence or urinary frequency/urgency Musculoskeletal ROS: negative for - joint swelling or muscular weakness Neurological ROS: as noted in HPI Dermatological ROS: negative for rash and skin lesion changes  Physical Examination: Blood pressure  121/81, pulse (!) 52, temperature 98 F (36.7 C), temperature source Oral, resp. rate 19, height 5\' 5"  (1.651 m), weight 87.1 kg (192 lb), SpO2 99 %.    Neurological Examination   Mental Status: Alert, oriented, thought content appropriate.  Speech fluent without evidence of aphasia.  Able to follow 3 step commands without difficulty. Cranial Nerves: II: Discs flat bilaterally;  Visual fields grossly normal, pupils equal, round, reactive to light and accommodation III,IV, VI: ptosis not present, extra-ocular motions intact bilaterally V,VII: smile symmetric, facial light touch sensation normal bilaterally VIII: hearing normal bilaterally IX,X: gag reflex present XI: bilateral shoulder shrug XII: midline tongue extension Motor: Right : Upper extremity   5/5    Left:     Upper extremity   5/5  Lower extremity   4+/5     Lower extremity   5/5 Tone and bulk:normal tone throughout; no atrophy noted Sensory: Pinprick and light touch intact throughout, bilaterally Deep Tendon Reflexes: 2+ and symmetric throughout Plantars: Right: downgoing   Left: downgoing Cerebellar: normal finger-to-nose, normal rapid alternating movements and normal heel-to-shin test Gait: not tested       Laboratory Studies:   Basic Metabolic Panel:  Recent Labs Lab 05/16/17 1105 05/17/17 0508 05/18/17 0408  NA 139 135 137  K 3.9 4.2 3.8  CL 108 106 106  CO2 25 24 27   GLUCOSE 104* 162* 125*  BUN 8 10 8   CREATININE 0.85 0.64 0.38*  CALCIUM 9.2 9.0 8.7*    Liver Function Tests:  Recent Labs Lab 05/16/17 1105  AST 33  ALT 30  ALKPHOS 37*  BILITOT 0.5  PROT 7.0  ALBUMIN 4.1   No results for input(s): LIPASE, AMYLASE in the last 168 hours. No results for input(s): AMMONIA in the last 168 hours.  CBC:  Recent Labs Lab 05/16/17 1105 05/17/17 0508 05/18/17 0408  WBC 7.6 4.5 7.6  NEUTROABS 4.3  --   --   HGB 13.8 13.1 13.0  HCT 39.4 37.4 36.8  MCV 98.4 99.2 97.5  PLT 219 194 198    Cardiac Enzymes: No results for input(s): CKTOTAL, CKMB, CKMBINDEX, TROPONINI in the last 168 hours.  BNP: Invalid input(s): POCBNP  CBG: No results for input(s): GLUCAP in the last 168 hours.  Microbiology: Results for orders placed or performed during the hospital encounter of 09/16/16  Wet prep, genital     Status: Abnormal   Collection Time: 09/16/16  7:07 PM  Result  Value Ref Range Status   Yeast Wet Prep HPF POC NONE SEEN NONE SEEN Final   Trich, Wet Prep NONE SEEN NONE SEEN Final   Clue Cells Wet Prep HPF POC PRESENT (A) NONE SEEN Final   WBC, Wet Prep HPF POC MANY (A) NONE SEEN Final    Comment: Specimen diluted due to transport tube containing more than 1 ml of saline, interpret results with caution.   Sperm NONE SEEN  Final  Chlamydia/NGC rt PCR ([place] only)     Status: Abnormal   Collection Time: 09/16/16  7:07 PM  Result Value Ref Range Status   Specimen source GC/Chlam ENDOCERVICAL  Final   Chlamydia Tr NOT DETECTED NOT DETECTED Final   N gonorrhoeae DETECTED (A) NOT DETECTED Final    Comment: (NOTE) 100  This methodology has not been evaluated in pregnant women or in 200  patients with a history of hysterectomy. 300 400  This methodology will not be performed on patients less than 62  years of age.  Coagulation Studies: No results for input(s): LABPROT, INR in the last 72 hours.  Urinalysis:   Recent Labs Lab 05/17/17 1700  COLORURINE YELLOW*  LABSPEC 1.026  PHURINE 5.0  GLUCOSEU NEGATIVE  HGBUR SMALL*  BILIRUBINUR NEGATIVE  KETONESUR NEGATIVE  PROTEINUR NEGATIVE  NITRITE NEGATIVE  LEUKOCYTESUR NEGATIVE    Lipid Panel:     Component Value Date/Time   CHOL 219 (H) 10/02/2016 0823   CHOL 149 08/28/2014 0439   TRIG 252 (H) 10/02/2016 0823   TRIG 278 (H) 08/28/2014 0439   HDL 42 10/02/2016 0823   HDL 22 (L) 08/28/2014 0439   CHOLHDL 5.2 10/02/2016 0823   VLDL 50 (H) 10/02/2016 0823   VLDL 56 (H) 08/28/2014 0439   LDLCALC 127 (H) 10/02/2016 0823   LDLCALC 71 08/28/2014 0439    HgbA1C: No results found for: HGBA1C  Urine Drug Screen:  No results found for: LABOPIA, COCAINSCRNUR, LABBENZ, AMPHETMU, THCU, LABBARB  Alcohol Level: No results for input(s): ETH in the last 168 hours.  Other results: EKG: normal EKG, normal sinus rhythm, unchanged from previous tracings.  Imaging: Mr Thoracic Spine Wo  Contrast  Result Date: 05/16/2017 CLINICAL DATA:  Mid thoracic pain EXAM: MRI THORACIC SPINE WITHOUT CONTRAST TECHNIQUE: Multiplanar, multisequence MR imaging of the thoracic spine was performed. No intravenous contrast was administered. COMPARISON:  Chest radiograph 02/11/2017 FINDINGS: Alignment:  Physiologic. Vertebrae: No fracture, evidence of discitis, or bone lesion. Cord:  Normal signal and morphology. Paraspinal and other soft tissues: Negative. Disc levels: No disc herniation, spinal canal stenosis or neural foraminal stenosis. IMPRESSION: Normal thoracic spine. Electronically Signed   By: Ulyses Jarred M.D.   On: 05/16/2017 21:17     Assessment/Plan:  36 y.o. female presents with back pain after being seen multiple times at [facility]. On Saturday she had worsening pain and she started icing it. On Sunday and Monday she had trouble even getting out of bed. She was taken to [facility] and was given a referral to her neurosurgeon after an MRI was given and she was given a prescription of oxycodone. Yesterday her right leg collapsed and she was taken back to the ER in [place] and given prednisone.  Imaging showed  R disk herniation at L4-L5   - pt significantly improved today. States was able to ambulate and go to bathroom - had 1,000cc urinary retention yesterday looked for sensory level which is not existent. No problems going to bathroom today - No peroneal sensory abnormalities - d/c planning and follow up with neurology as out pt - can also do EMG in 1-2 weeks.   Leotis Pain  05/18/2017, 1:08 PM

## 2017-05-18 NOTE — Progress Notes (Signed)
[facility] [place] NAME: Katherine Murray    MR#:  [id number]  DATE OF BIRTH:  [birth year]  SUBJECTIVE:  CHIEF COMPLAINT:   Chief Complaint  Patient presents with  . Back Pain   Patient had thousand cc of urine yesterday had to have in and out Foley catheter Was able to void on her own today Still has back pain    REVIEW OF SYSTEMS:  Review of Systems  Constitutional: Negative for chills, fever and weight loss.  HENT: Negative for nosebleeds and sore throat.   Eyes: Negative for blurred vision.  Respiratory: Negative for cough, shortness of breath and wheezing.   Cardiovascular: Negative for chest pain, orthopnea, leg swelling and PND.  Gastrointestinal: Negative for abdominal pain, constipation, diarrhea, heartburn, nausea and vomiting.  Genitourinary: Negative for dysuria and urgency.  Musculoskeletal: Positive for back pain.  Skin: Negative for rash.  Neurological: Negative for dizziness, speech change, focal weakness and headaches.  Endo/Heme/Allergies: Does not bruise/bleed easily.  Psychiatric/Behavioral: Negative for depression.    DRUG ALLERGIES:   Allergies  Allergen Reactions  . Lyrica [Pregabalin] Swelling  . Eggs Or Egg-Derived Products Rash and Swelling  . Latex Rash   VITALS:  Blood pressure 104/69, pulse 60, temperature 98.2 F (36.8 C), temperature source Oral, resp. rate 19, height 5\' 5"  (1.651 m), weight 192 lb (87.1 kg), SpO2 99 %. PHYSICAL EXAMINATION:  Physical Exam  Constitutional: She is oriented to person, place, and time and well-developed, well-nourished, and in no distress.  HENT:  Head: Normocephalic and atraumatic.  Eyes: Conjunctivae and EOM are normal. Pupils are equal, round, and reactive to light.  Neck: Normal range of motion. Neck supple. No tracheal deviation present. No thyromegaly present.  Cardiovascular: Normal rate, regular rhythm and normal heart sounds.   Pulmonary/Chest: Effort  normal and breath sounds normal. No respiratory distress. She has no wheezes. She exhibits no tenderness.  Abdominal: Soft. Bowel sounds are normal. She exhibits no distension. There is no tenderness.  Musculoskeletal: Normal range of motion.  Neurological: She is alert and oriented to person, place, and time. No cranial nerve deficit.  Skin: Skin is warm and dry. No rash noted.  Psychiatric: Mood and affect normal.   LABORATORY PANEL:  Female CBC  Recent Labs Lab 05/18/17 0408  WBC 7.6  HGB 13.0  HCT 36.8  PLT 198   ------------------------------------------------------------------------------------------------------------------ Chemistries   Recent Labs Lab 05/16/17 1105  05/18/17 0408  NA 139  < > 137  K 3.9  < > 3.8  CL 108  < > 106  CO2 25  < > 27  GLUCOSE 104*  < > 125*  BUN 8  < > 8  CREATININE 0.85  < > 0.38*  CALCIUM 9.2  < > 8.7*  AST 33  --   --   ALT 30  --   --   ALKPHOS 37*  --   --   BILITOT 0.5  --   --   < > = values in this interval not displayed. RADIOLOGY:  No results found. ASSESSMENT AND PLAN:   1. Lumbar radiculopathy of right leg with numbness and weakness. Pain in right sacroiliac joint.  - Continue Decadron.  - Pain control with oral medications. Physical therapy evaluation. - Neurosurgery does not feel this is cauda equina syndrome.Will need outpatient follow-up   - They recommend Neurology consultation has been requested  - She may need outpatient pain management or physiatry referral,  and follow-up in neurosurgery clinic .  If nothing obvious here.  2. History of atrial fibrillation. On metoprolol for rate control, not on anticoagulation 3. History of endometriosis: Outpatient evaluation 4. History of migraine on Fioricet when necessary 5. Urinary retention patient was able to void on her own     All the records are reviewed and case discussed with Care Management/Social Worker. Management plans discussed with the patient, family  and they are in agreement.  CODE STATUS: Full Code  TOTAL TIME TAKING CARE OF THIS PATIENT: 32 minutes.   More than 50% of the time was spent in counseling/coordination of care: YES  POSSIBLE D/C IN 1-2 DAYS, DEPENDING ON CLINICAL CONDITION.  And neurology and neurosurgical evaluation   [name] M.D on 05/18/2017 at 2:00 PM  Between 7am to 6pm - Pager - [phone]  After 6pm go to [email or web address] - Proofreader  [facility]  Office  [phone]  CC: Primary care physician; Pleas Koch, NP  Note: This dictation was prepared with Dragon dictation along with smaller phrase technology. Any transcriptional errors that result from this process are unintentional.

## 2017-05-18 NOTE — Progress Notes (Signed)
OT Cancellation Note  Patient Details Name: Katherine Murray MRN: [id number] DOB: October 12, 1981   Cancelled Treatment:    Reason Eval/Treat Not Completed: OT screened, no needs identified, will sign off. Order received, chart reviewed. Pt dressed, preparing for discharge after PT screen. Pt with no functional deficits and eager to return home. No OT needs. Will sign off.   Jeni Salles, MPH, MS, OTR/L ascom [phone] 05/18/17, 3:04 PM

## 2017-05-20 ENCOUNTER — Telehealth: Payer: Self-pay | Admitting: *Deleted

## 2017-05-20 ENCOUNTER — Telehealth: Payer: Self-pay | Admitting: Primary Care

## 2017-05-20 MED ORDER — OXYCODONE HCL 5 MG PO TABS: 5.0000 mg | ORAL_TABLET | Freq: Every day | ORAL | 0 refills | Status: DC | PRN

## 2017-05-20 NOTE — Telephone Encounter (Signed)
Patient was at [facility] and discharge on 05/18/17. Called patient to schedule follow-up with Allie Bossier NP which appointment is scheduled for 05/28/17. Patient stated that she has an appointment scheduled with Dr. Oren Binet on 05/23/17. Patient stated that when she talked to his office today the nurse told her that he will not give her anything for pain until after he does her surgery. Patient stated that she was given Oxycodone 5 mg to take very 4 hours as needed for pain at [facility] when she first injured her back. Patient stated that she only has 3 pills left and is only taking them at night. Patient requested enough to last her until she can get in to see Allie Bossier NP next week.

## 2017-05-20 NOTE — Telephone Encounter (Signed)
Transition Care Management Follow-up Telephone Call   Date discharged? 05/18/2017   How have you been since you were released from the hospital? Still in pain, but has an appointment with neurosurgeon on 05/23/17.   Do you understand why you were in the hospital? YES   Do you understand the discharge instructions? YES   Where were you discharged to? HOME   Items Reviewed:  Medications reviewed: YES  Allergies reviewed: YES  Dietary changes reviewed: YES  Referrals reviewed: YES   Functional Questionnaire:   Activities of Daily Living (ADLs):   She states they are independent in the following: Patient stated that she is able to do daily living, grooming, bathing, ambulation, etc.  States they require assistance with the following: Patient stated that she does not need assistance, but does have her mom close by to help if needed.   Any transportation issues/concerns?: NO   Any patient concerns? NO   Confirmed importance and date/time of follow-up visits scheduled YES    Provider Appointment booked with Allie Bossier NP on 05/28/17 at 4:00  Patient has appointment scheduled with neurosurgeon on 05/23/17  Confirmed with patient if condition begins to worsen call PCP or go to the ER.  Patient was given the office number and encouraged to call back with question or concerns.  : YES

## 2017-05-20 NOTE — Telephone Encounter (Signed)
Patient returned Regina's call. °

## 2017-05-20 NOTE — Telephone Encounter (Signed)
According to [place], she got 10 pills from [facility] in 04/2017 but nothing prior to that for a few months.   rx printed for 10 tabs.  Thanks.  Routed to PCP as FYI.

## 2017-05-21 NOTE — Telephone Encounter (Signed)
Spoken to patient and was notified that Rx is ready for pick up. Left in the front office.

## 2017-05-21 NOTE — Telephone Encounter (Signed)
Message left for patient to return my call.  

## 2017-05-23 NOTE — Telephone Encounter (Signed)
Noted  

## 2017-05-24 ENCOUNTER — Other Ambulatory Visit: Payer: Self-pay | Admitting: Neurosurgery

## 2017-05-24 DIAGNOSIS — R292 Abnormal reflex: Secondary | ICD-10-CM

## 2017-05-28 ENCOUNTER — Encounter: Payer: Self-pay | Admitting: Primary Care

## 2017-05-28 ENCOUNTER — Ambulatory Visit (INDEPENDENT_AMBULATORY_CARE_PROVIDER_SITE_OTHER): Payer: Medicaid Other | Admitting: Primary Care

## 2017-05-28 VITALS — BP 124/86 | HR 98 | Temp 98.3°F | Ht 65.0 in | Wt 207.8 lb

## 2017-05-28 DIAGNOSIS — M5441 Lumbago with sciatica, right side: Secondary | ICD-10-CM

## 2017-05-28 DIAGNOSIS — R112 Nausea with vomiting, unspecified: Secondary | ICD-10-CM | POA: Diagnosis not present

## 2017-05-28 DIAGNOSIS — G8929 Other chronic pain: Secondary | ICD-10-CM

## 2017-05-28 MED ORDER — ONDANSETRON 4 MG PO TBDP: 4.0000 mg | ORAL_TABLET | Freq: Three times a day (TID) | ORAL | 0 refills | Status: DC | PRN

## 2017-05-28 MED ORDER — TRAMADOL HCL 50 MG PO TABS: 50.0000 mg | ORAL_TABLET | Freq: Three times a day (TID) | ORAL | 0 refills | Status: DC | PRN

## 2017-05-28 NOTE — Assessment & Plan Note (Signed)
Evaluated at [facility] ED's for chronic low back pain. Admitted in late June for questionable cauda equina syndrome which was ruled out during admission. Currently following with neurosurgery and undergoing MRI's and EMG testing.   I do believe she's experiencing pain, but am also reticent given suspicious pain medication seeking behavior in the past. This suspicion was also noted per Neurology during admission. Prescribed short term supply of Tramadol for PRN use. Will defer any further prescriptions for pain management to Neurosurgery. Referral placed for pain management in case she doesn't undergo surgery. Rx for Zofran provided for PRN use.  All hospital notes, imaging, labs reviewed.

## 2017-05-28 NOTE — Patient Instructions (Signed)
You may take the Tramadol 50 mg tablets every 8 hours as needed for severe pain. Use this sparingly.  You may take the Zofran every 8 hours as needed for nausea.  You will be contacted regarding your referral to pain management. Please let us know if you have not heard back within one week.   Follow up with neurosurgery as scheduled.  It was a pleasure to see you today!

## 2017-05-28 NOTE — Progress Notes (Signed)
Subjective:    Patient ID: Katherine Murray, female    DOB: 1981-07-28, 36 y.o.   MRN: [id number]  HPI  Katherine Murray is a 36 year old female who presents today for [facility] Follow Up.  She presented to [facility] ED on 05/16/17 with a chief complaint of back pain. She was throwing her 43 pound daughter around in the pool one week prior when she first experienced low back pain. 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 She was set up with a neurosurgeon in the outpatient setting for further evaluation and was instructed to go to the ED if she developed increased symptoms.  Prior to her follow up with the neurosurgeon she developed right lower extremity numbness and weakness without sensation; incontinence of urine, and severe right sharp lower back pain with radiculopathy to right lower extremity. She underwent repeat MRI at [facility] without significant changes. Neuro examination was stable. She was admitted for neurosurgery evaluation and monitoring.  She was assessed by neurosurgery who recommended neuroaxis MRI, Neurology consultation, physical therapy, steroids, outpatient physiatry, and outpatient neurosurgery evaluation. Neurosurgery did not feel that her symptoms were associated to cauda equina syndrome and also didn't feel the need for surgical intervention. Neurology evaluated the patient and suspected she was seeking pain medications. Neurology recommended outpatient pain management, EMG's.  She was discharged home on 05/18/17. Since her discharge home she's doing better. She's struggled with intermittent nausea/vomiting/diarrhea and difficulty urinating. She's continued to experience intermittent numbness/tingling to her right lower extremity. Her vomiting has resolved.  She's seen her neurosurgeon who's planning on completing several  MRI's and also EMG testing. She has an appointment with Urology in late July for difficulty urinating. She called into our office last week requesting pain medication until she could see her Neurosurgeon this week. She was provided with oxycodone 5 mg, and was dispensed 7 tablets at the pharmacy for which she's completed 7 of those tablets. She's requesting additional pain medication to take at bedtime. Her neurosurgeon has refused to supply pain medication until she goes for surgery.   Review of Systems  Constitutional: Negative for fever.  Cardiovascular: Negative for leg swelling.  Gastrointestinal: Positive for diarrhea, nausea and vomiting.       No fecal incontinence   Genitourinary: Positive for difficulty urinating.       No urinary incontinence.  Neurological: Positive for weakness and numbness.       Past Medical History:  Diagnosis Date  . A-fib ([place])   . Cancer ([place])    skin  . Diverticulosis   . Gallstones   . Headache   . Heart rate fast   . Kidney stones   . Lower abdominal pain    Right  . Mitral regurgitation    Mild  . Ovarian cyst 09/16/2016   right measuring 4.4 cm  . Pneumonia   . Tricuspid regurgitation    Trace  . Vaginal infection      Social History   Social History  . Marital status: Single    Spouse name: N/A  . Number of children: N/A  . Years of education: N/A   Occupational History  . therapist [employer]   Social History Main Topics  . Smoking status: Current Some Day Smoker    Packs/day: 0.10    Years: 7.00    Types:  Cigarettes  . Smokeless tobacco: Never Used     Comment: smokes "once or twice a week"  . Alcohol use Yes     Comment: rare  . Drug use: No  . Sexual activity: Yes    Birth control/ protection: Injection   Other Topics Concern  . Not on file   Social History Narrative  . No narrative on file    Past Surgical History:  Procedure Laterality Date  . CESAREAN SECTION    . CHOLECYSTECTOMY      . GALLBLADDER SURGERY    . LAPAROSCOPIC OVARIAN CYSTECTOMY Right 09/24/2016   Procedure: LAPAROSCOPIC RIGHT OVARIAN CYSTECTOMY, Excision of ovarian cyst, Excision of endometeriosis,;  Surgeon: Brayton Mars, MD;  Location: [facility] ORS;  Service: Gynecology;  Laterality: Right;  . TONSILLECTOMY      Family History  Problem Relation Age of Onset  . Colon cancer Paternal Grandmother   . Diabetes Paternal Grandmother   . Ulcerative colitis Unknown   . Kidney disease Unknown   . Stomach cancer Maternal Grandfather   . Diabetes Mother   . Diabetes Father   . Ovarian cancer Maternal Grandmother   . Breast cancer Neg Hx     Allergies  Allergen Reactions  . Lyrica [Pregabalin] Swelling  . Eggs Or Egg-Derived Products Rash and Swelling  . Latex Rash    Current Outpatient Prescriptions on File Prior to Visit  Medication Sig Dispense Refill  . butalbital-acetaminophen-caffeine (FIORICET, ESGIC) 50-325-40 MG tablet Take 1 tablet by mouth 2 (two) times daily as needed for migraine. 30 tablet 0  . escitalopram (LEXAPRO) 10 MG tablet TAKE 1 TABLET(10 MG) BY MOUTH DAILY 90 tablet 0  . ibuprofen (ADVIL,MOTRIN) 800 MG tablet Take 1 tablet (800 mg total) by mouth every 8 (eight) hours as needed. 30 tablet 0  . metoprolol (LOPRESSOR) 50 MG tablet TAKE 1 TABLET(50 MG) BY MOUTH TWICE DAILY 180 tablet 3   No current facility-administered medications on file prior to visit.     BP 124/86   Pulse 98   Temp 98.3 F (36.8 C) (Oral)   Ht 5\' 5"  (1.651 m)   Wt 207 lb 12.8 oz (94.3 kg)   SpO2 98%   BMI 34.58 kg/m    Objective:   Physical Exam  Constitutional: She appears well-nourished.  Neck: Neck supple.  Cardiovascular: Normal rate and regular rhythm.   Pulmonary/Chest: Effort normal and breath sounds normal.  Abdominal: Soft. Bowel sounds are normal. There is no tenderness.  Musculoskeletal:       Lumbar back: She exhibits decreased range of motion and pain. She exhibits no bony  tenderness.  3/5 strength to right lower extremity, 5/5 to left lower extremity. Positive right straight leg raise.  Skin: Skin is warm and dry.          Assessment & Plan:

## 2017-05-31 ENCOUNTER — Ambulatory Visit: Payer: Medicaid Other

## 2017-05-31 ENCOUNTER — Ambulatory Visit
Admission: RE | Admit: 2017-05-31 | Discharge: 2017-05-31 | Disposition: A | Discharge status: Home / Self Care | Payer: Medicaid Other | Source: Ambulatory Visit | Attending: Neurosurgery | Admitting: Neurosurgery

## 2017-05-31 DIAGNOSIS — R292 Abnormal reflex: Secondary | ICD-10-CM

## 2017-05-31 DIAGNOSIS — M2548 Effusion, other site: Secondary | ICD-10-CM | POA: Diagnosis not present

## 2017-05-31 DIAGNOSIS — R9089 Other abnormal findings on diagnostic imaging of central nervous system: Secondary | ICD-10-CM | POA: Diagnosis not present

## 2017-06-10 ENCOUNTER — Ambulatory Visit: Payer: 59 | Admitting: Urology

## 2017-06-10 ENCOUNTER — Encounter: Payer: Self-pay | Admitting: Urology

## 2017-07-06 ENCOUNTER — Encounter: Payer: Self-pay | Admitting: Emergency Medicine

## 2017-07-06 ENCOUNTER — Emergency Department: Payer: Medicaid Other

## 2017-07-06 ENCOUNTER — Emergency Department
Admission: EM | Admit: 2017-07-06 | Discharge: 2017-07-06 | Disposition: A | Discharge status: Home / Self Care | Payer: Medicaid Other | Attending: Emergency Medicine | Admitting: Emergency Medicine

## 2017-07-06 DIAGNOSIS — Y929 Unspecified place or not applicable: Secondary | ICD-10-CM | POA: Diagnosis not present

## 2017-07-06 DIAGNOSIS — Y939 Activity, unspecified: Secondary | ICD-10-CM | POA: Diagnosis not present

## 2017-07-06 DIAGNOSIS — Z79899 Other long term (current) drug therapy: Secondary | ICD-10-CM | POA: Diagnosis not present

## 2017-07-06 DIAGNOSIS — Z91040 Latex allergy status: Secondary | ICD-10-CM | POA: Insufficient documentation

## 2017-07-06 DIAGNOSIS — W010XXA Fall on same level from slipping, tripping and stumbling without subsequent striking against object, initial encounter: Secondary | ICD-10-CM | POA: Insufficient documentation

## 2017-07-06 DIAGNOSIS — S59902A Unspecified injury of left elbow, initial encounter: Secondary | ICD-10-CM

## 2017-07-06 DIAGNOSIS — Y999 Unspecified external cause status: Secondary | ICD-10-CM | POA: Diagnosis not present

## 2017-07-06 DIAGNOSIS — F17210 Nicotine dependence, cigarettes, uncomplicated: Secondary | ICD-10-CM | POA: Diagnosis not present

## 2017-07-06 MED ORDER — TRAMADOL HCL 50 MG PO TABS: 50.0000 mg | ORAL_TABLET | Freq: Four times a day (QID) | ORAL | 0 refills | Status: AC | PRN

## 2017-07-06 MED ORDER — OXYCODONE-ACETAMINOPHEN 5-325 MG PO TABS
1.0000 | ORAL_TABLET | Freq: Once | ORAL | Status: AC
  Administered 2017-07-06: 1 via ORAL
  Filled 2017-07-06: qty 1

## 2017-07-06 MED ORDER — BACITRACIN ZINC 500 UNIT/GM EX OINT
TOPICAL_OINTMENT | Freq: Once | CUTANEOUS | Status: AC
  Administered 2017-07-06: 1 via TOPICAL

## 2017-07-06 MED ORDER — TRAMADOL HCL 50 MG PO TABS
ORAL_TABLET | ORAL | Status: AC
  Filled 2017-07-06: qty 1

## 2017-07-06 MED ORDER — ONDANSETRON 4 MG PO TBDP
4.0000 mg | ORAL_TABLET | Freq: Once | ORAL | Status: AC
  Administered 2017-07-06: 4 mg via ORAL
  Filled 2017-07-06: qty 1

## 2017-07-06 MED ORDER — TRAMADOL HCL 50 MG PO TABS
50.0000 mg | ORAL_TABLET | Freq: Once | ORAL | Status: AC
  Administered 2017-07-06: 50 mg via ORAL

## 2017-07-06 MED ORDER — BACITRACIN ZINC 500 UNIT/GM EX OINT
TOPICAL_OINTMENT | CUTANEOUS | Status: AC
  Administered 2017-07-06: 1 via TOPICAL
  Filled 2017-07-06: qty 0.9

## 2017-07-06 NOTE — ED Triage Notes (Signed)
Pt was seen at [facility] and told she had a broken left elbow.  Came in splint that goes just above elbow.  C/o swelling to fingers.  Appears like dependent edema but feels like splint is tighter.  Fingers normal color.

## 2017-07-06 NOTE — ED Provider Notes (Signed)
[facility] Emergency Department Provider Note  ____________________________________________  Time seen: Approximately 8:25 PM  I have reviewed the triage vital signs and the nursing notes.   HISTORY  Chief Complaint splint evaluation    HPI Katherine Murray is a 36 y.o. female that presents to the emergency department for evaluation of left arm splint. She fell this morning on her deck and was told at [facility] that she has an elbow fracture. She states that sometimes her blood sugar drops and causes her to fall. She can usually tell when her sugar is going to drop but this morning she did not feel it and fell on her left elbow. She was told to follow-up with ortho [facility] on Monday. She was given a Toradol injection at [facility]. She states that her arm is swelling more and now she has a lot of pain so she came to the emergency department to be further evaluated. No fever, shortness of breath, chest pain, numbness, tingling.   Past Medical History:  Diagnosis Date  . A-fib ([place])   . Cancer ([place])    skin  . Diverticulosis   . Gallstones   . Headache   . Heart rate fast   . Kidney stones   . Lower abdominal pain    Right  . Mitral regurgitation    Mild  . Ovarian cyst 09/16/2016   right measuring 4.4 cm  . Pneumonia   . Tricuspid regurgitation    Trace  . Vaginal infection     Patient Active Problem List   Diagnosis Date Noted  . Back pain 05/16/2017  . GAD (generalized anxiety disorder) 02/13/2017  . Preventative health care 10/04/2016  . Hyperlipidemia 10/04/2016  . Pelvic adhesive disease 09/24/2016  . Endometriosis determined by laparoscopy 09/24/2016  . Skin abnormality 08/01/2016  . Tachycardia 08/01/2016  . Encounter for Depo-Provera contraception 08/01/2016  . Migraines 08/01/2016    Past Surgical History:  Procedure Laterality Date  . CESAREAN SECTION    . CHOLECYSTECTOMY    . GALLBLADDER SURGERY    . LAPAROSCOPIC OVARIAN  CYSTECTOMY Right 09/24/2016   Procedure: LAPAROSCOPIC RIGHT OVARIAN CYSTECTOMY, Excision of ovarian cyst, Excision of endometeriosis,;  Surgeon: Brayton Mars, MD;  Location: [facility] ORS;  Service: Gynecology;  Laterality: Right;  . TONSILLECTOMY      Prior to Admission medications   Medication Sig Start Date End Date Taking? Authorizing Provider  butalbital-acetaminophen-caffeine (FIORICET, ESGIC) 50-325-40 MG tablet Take 1 tablet by mouth 2 (two) times daily as needed for migraine. 01/24/17   Pleas Koch, NP  escitalopram (LEXAPRO) 10 MG tablet TAKE 1 TABLET(10 MG) BY MOUTH DAILY 02/13/17   Pleas Koch, NP  ibuprofen (ADVIL,MOTRIN) 800 MG tablet Take 1 tablet (800 mg total) by mouth every 8 (eight) hours as needed. 05/18/17   [name], MD  metoprolol (LOPRESSOR) 50 MG tablet TAKE 1 TABLET(50 MG) BY MOUTH TWICE DAILY 11/23/16   Pleas Koch, NP  ondansetron (ZOFRAN ODT) 4 MG disintegrating tablet Take 1 tablet (4 mg total) by mouth every 8 (eight) hours as needed for nausea or vomiting. 05/28/17   Pleas Koch, NP  traMADol (ULTRAM) 50 MG tablet Take 1 tablet (50 mg total) by mouth every 6 (six) hours as needed. 07/06/17 07/06/18  Laban Emperor, PA-C    Allergies Lyrica [pregabalin]; Eggs or egg-derived products; and Latex  Family History  Problem Relation Age of Onset  . Colon cancer Paternal Grandmother   .  Diabetes Paternal Grandmother   . Ulcerative colitis Unknown   . Kidney disease Unknown   . Stomach cancer Maternal Grandfather   . Diabetes Mother   . Diabetes Father   . Ovarian cancer Maternal Grandmother   . Breast cancer Neg Hx     Social History Social History  Substance Use Topics  . Smoking status: Current Some Day Smoker    Packs/day: 0.10    Years: 7.00    Types: Cigarettes  . Smokeless tobacco: Never Used     Comment: smokes "once or twice a week"  . Alcohol use Yes     Comment: rare     Review of Systems  Constitutional: No  fever/chills Cardiovascular: No chest pain. Respiratory:  No SOB. Gastrointestinal: No abdominal pain.  No nausea, no vomiting.  Musculoskeletal: Positive for left elbow pain. Skin: Negative for rash, abrasions, lacerations, ecchymosis.   ____________________________________________   PHYSICAL EXAM:  VITAL SIGNS: ED Triage Vitals  Enc Vitals Group     BP 07/06/17 1840 (!) 114/59     Pulse Rate 07/06/17 1840 77     Resp 07/06/17 1840 20     Temp 07/06/17 1840 98.1 F (36.7 C)     Temp Source 07/06/17 1840 Oral     SpO2 07/06/17 1840 98 %     Weight 07/06/17 1841 198 lb (89.8 kg)     Height 07/06/17 1841 5\' 5"  (1.651 m)     Head Circumference --      Peak Flow --      Pain Score 07/06/17 1843 10     Pain Loc --      Pain Edu? --      Excl. in [place]? --      Constitutional: Alert and oriented. Well appearing and in no acute distress. Eyes: Conjunctivae are normal. PERRL. EOMI. Head: Atraumatic. ENT:      Ears:      Nose: No congestion/rhinnorhea.      Mouth/Throat: Mucous membranes are moist.  Neck: No stridor.   Cardiovascular: Normal rate, regular rhythm.  Good peripheral circulation. 2+ radial pulses. Respiratory: Normal respiratory effort without tachypnea or retractions. Lungs CTAB. Good air entry to the bases with no decreased or absent breath sounds. Musculoskeletal: No gross deformities appreciated. Hypersensitive to palpation over left elbow and forearm.  Neurologic:  Normal speech and language. No gross focal neurologic deficits are appreciated. Sensation of fingers intact. Skin:  Skin is warm, dry and intact. No rash noted.   ____________________________________________   LABS (all labs ordered are listed, but only abnormal results are displayed)  Labs Reviewed - No data to display ____________________________________________  EKG   ____________________________________________  RADIOLOGY Robinette Haines, personally viewed and evaluated these images  (plain radiographs) as part of my medical decision making, as well as reviewing the written report by the radiologist.  Dg Elbow Complete Left  Result Date: 07/06/2017 CLINICAL DATA:  Swelling of the fingers. Patient was told that he has an elbow fracture and placed in a splint. EXAM: LEFT ELBOW - COMPLETE 3+ VIEW COMPARISON:  None. FINDINGS: Splint material overlies the elbow and obscures bony details. Calcific fragment at the proximal radioulnar articulation may represent a fracture fragment without well seen donor site. The evaluation for dislocation or joint effusion is suboptimal due to limited lateral view. IMPRESSION: Questionable fracture fragment adjacent at the proximal radioulnar articulation. Electronically Signed   By: Fidela Salisbury M.D.   On: 07/06/2017 19:11   Dg Forearm Left  Result Date: 07/06/2017 CLINICAL DATA:  Fall possible fracture EXAM: LEFT FOREARM - 2 VIEW COMPARISON:  Elbow radiograph 07/06/2017 FINDINGS: Distal forearm and radius are intact. Limited evaluation for elbow effusion due to nonstandard positioning. Re- demonstrated small osseous density between the radial head and proximal ulna. IMPRESSION: 1. Re- demonstrated small osseous density between the radial head and proximal ulna, questionable for acute fracture 2. Otherwise no acute osseous abnormality seen. Limited assessment for elbow effusion due to positioning Electronically Signed   By: Donavan Foil M.D.   On: 07/06/2017 19:52    ____________________________________________    PROCEDURES  Procedure(s) performed:    Procedures    Medications  oxyCODONE-acetaminophen (PERCOCET/ROXICET) 5-325 MG per tablet 1 tablet (1 tablet Oral Given 07/06/17 1935)  ondansetron (ZOFRAN-ODT) disintegrating tablet 4 mg (4 mg Oral Given 07/06/17 1935)  traMADol (ULTRAM) tablet 50 mg (50 mg Oral Given 07/06/17 2102)  bacitracin ointment (1 application Topical Given 07/06/17 2123)      ____________________________________________   INITIAL IMPRESSION / ASSESSMENT AND PLAN / ED COURSE  Pertinent labs & imaging results that were available during my care of the patient were reviewed by me and considered in my medical decision making (see chart for details).  Review of the [place] CSRS was performed in accordance of the [place] prior to dispensing any controlled drugs.  Patient presented to the emergency department for evaluation of elbow splint. Vital signs and exam are reassuring. X-ray indicates possible fracture at radioulnar articulation. Arm is neurovascularly intact. Splint was reapplied. Patient will be discharged home with prescriptions for a very short course of tramadol. Patient is to follow up with ortho as directed. Patient is given ED precautions to return to the ED for any worsening or new symptoms.     ____________________________________________  FINAL CLINICAL IMPRESSION(S) / ED DIAGNOSES  Final diagnoses:  Injury of left elbow, initial encounter      NEW MEDICATIONS STARTED DURING THIS VISIT:  Discharge Medication List as of 07/06/2017  9:08 PM          This chart was dictated using voice recognition software/Dragon. Despite best efforts to proofread, errors can occur which can change the meaning. Any change was purely unintentional.    Laban Emperor, PA-C 07/06/17 2340    Nena Polio, MD 07/07/17 [phone]

## 2017-07-18 ENCOUNTER — Telehealth: Payer: Self-pay | Admitting: Primary Care

## 2017-07-18 NOTE — Telephone Encounter (Signed)
Left message asking pt to call the office regarding referral °

## 2017-08-16 ENCOUNTER — Other Ambulatory Visit: Payer: Self-pay | Admitting: Primary Care

## 2017-08-16 DIAGNOSIS — R000 Tachycardia, unspecified: Secondary | ICD-10-CM

## 2017-08-16 DIAGNOSIS — F411 Generalized anxiety disorder: Secondary | ICD-10-CM

## 2018-01-04 IMAGING — CT CT ABD-PELV W/ CM
2 of 4 series · 16 of 46 positions shown, 18 images · IV contrast (iopamidol)
Comparison: April 19, 2016

CLINICAL DATA: Right lower quadrant pain for 2 days.

EXAM:
CT ABDOMEN AND PELVIS WITH CONTRAST
TECHNIQUE: Multidetector CT imaging of the abdomen and pelvis was performed
using the standard protocol following bolus administration of
intravenous contrast.
CONTRAST:  100mL 41IDXC-FNN IOPAMIDOL (41IDXC-FNN) INJECTION 61%

[Series 2: axial st · axial · 0.74mm/px · z∈[-429,+1]mm · 13 of 94 slices shown, 15 images]
[im 4/94  soft-tissue]
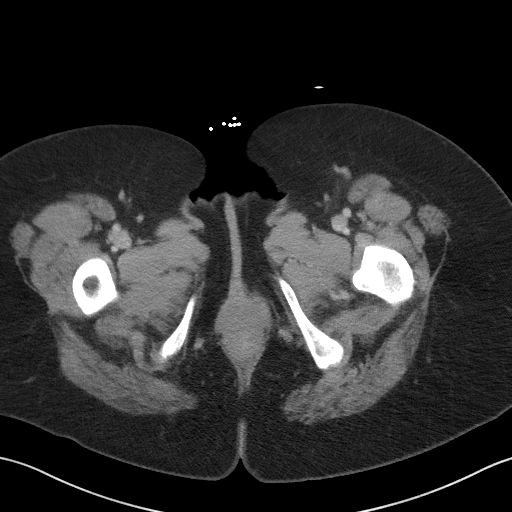
[im 4/94  bone]
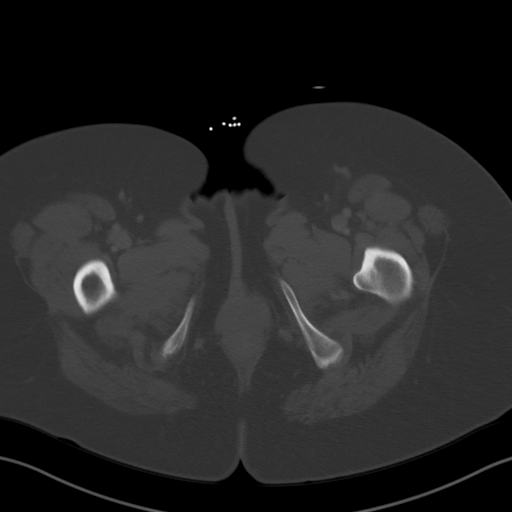
[im 12/94  soft-tissue]
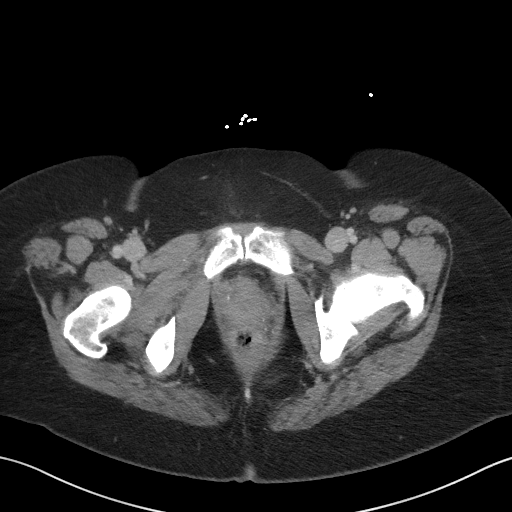
[im 20/94  soft-tissue]
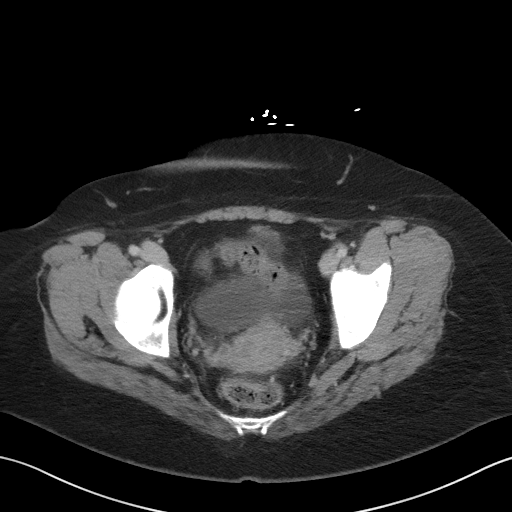
[im 28/94  soft-tissue]
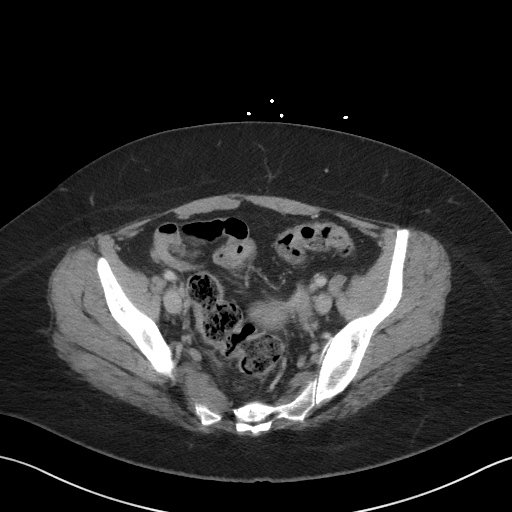
[im 32/94  soft-tissue]
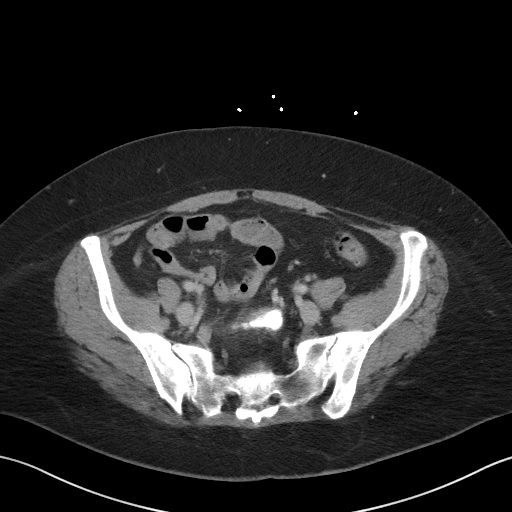
[im 39/94  soft-tissue]
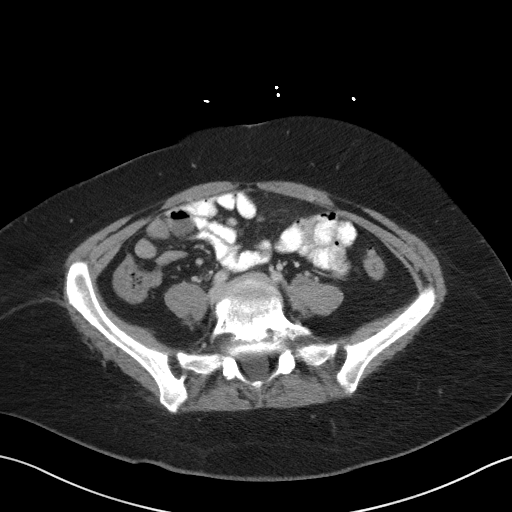
[im 47/94  soft-tissue]
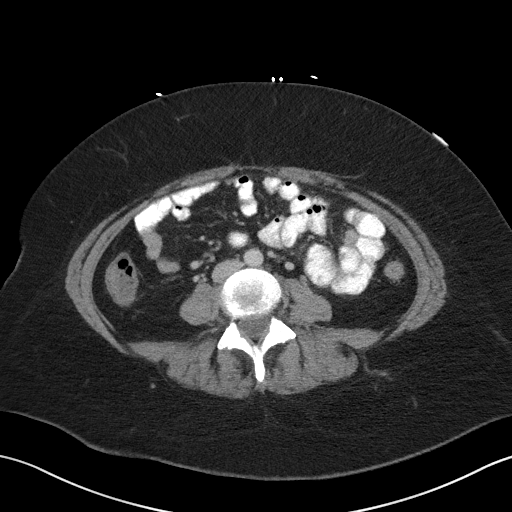
[im 55/94  soft-tissue]
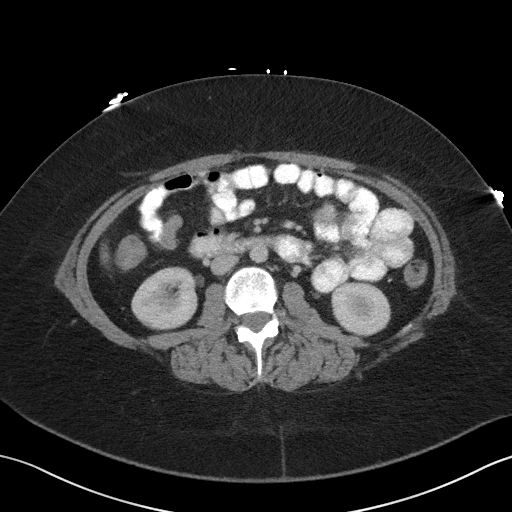
[im 63/94  soft-tissue]
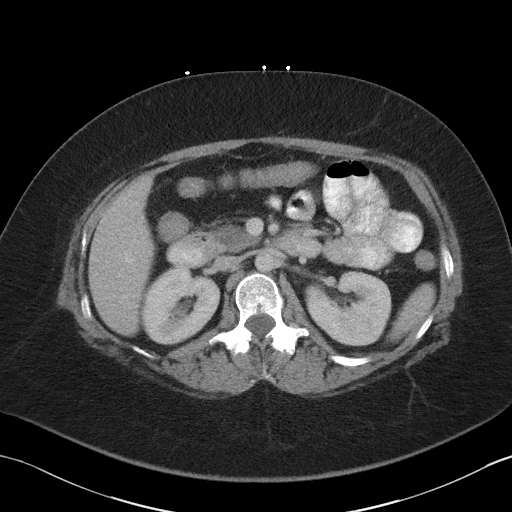
[im 63/94  bone]
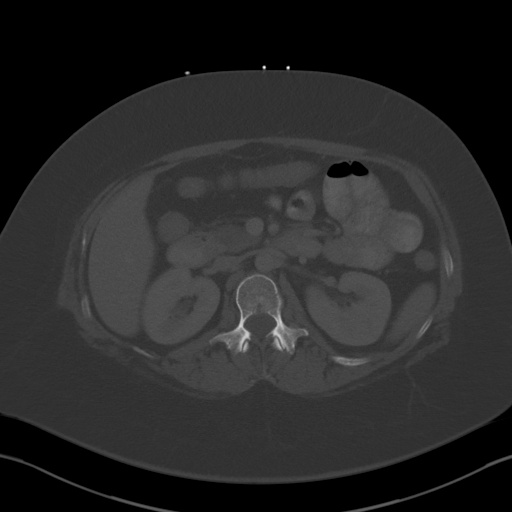
[im 66/94  soft-tissue]
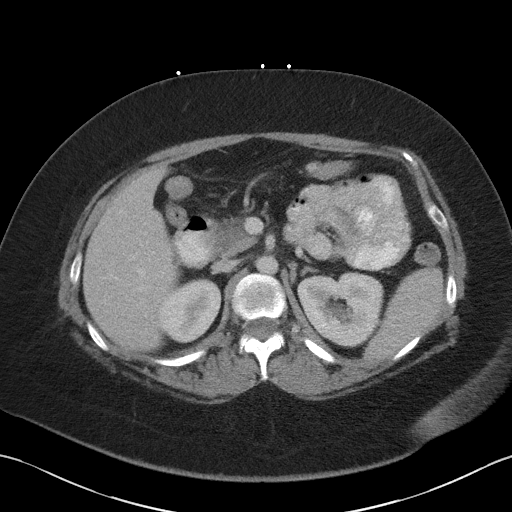
[im 74/94  soft-tissue]
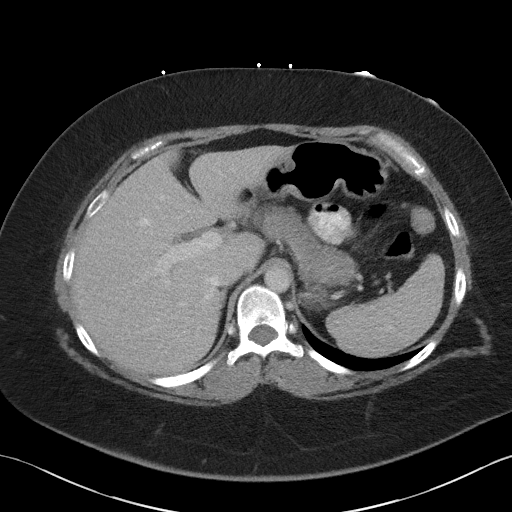
[im 82/94  soft-tissue]
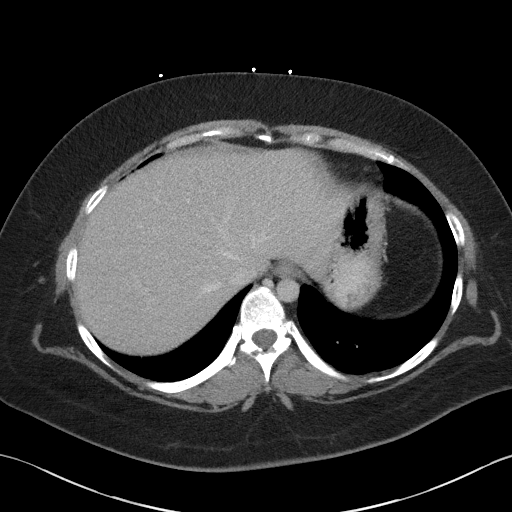
[im 90/94  soft-tissue]
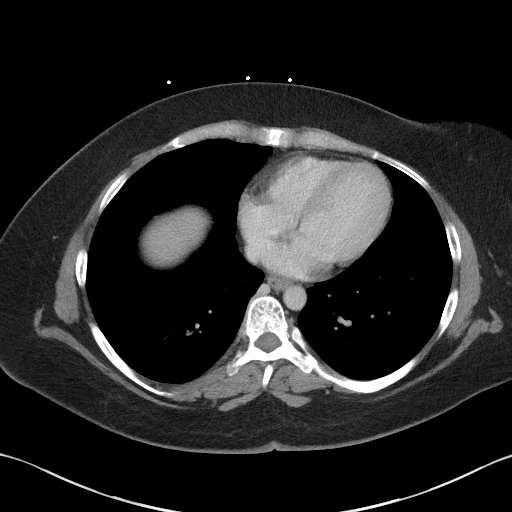

[Series 5: coronal st · coronal · 0.68mm/px · 3 of 101 slices shown]
[im 34/101  soft-tissue]
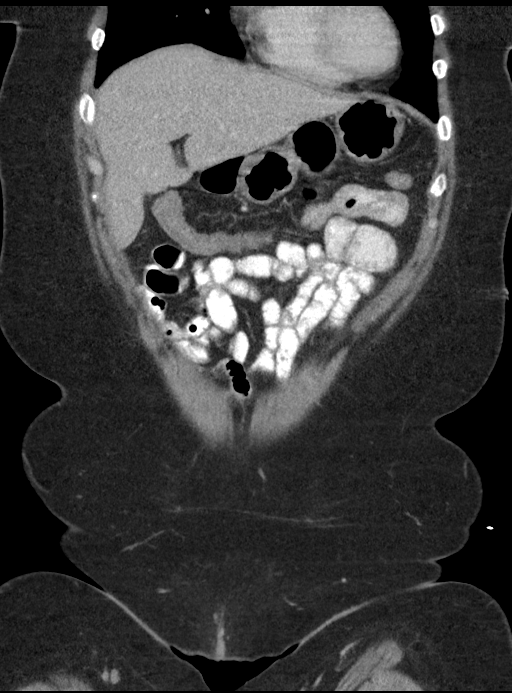
[im 45/101  soft-tissue]
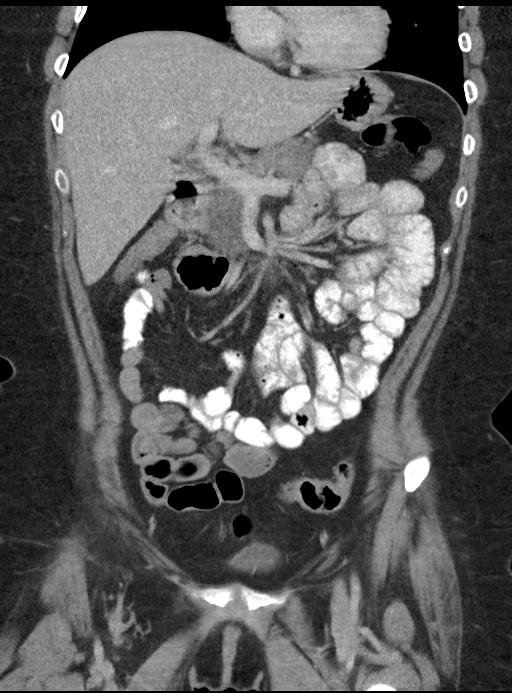
[im 56/101  soft-tissue]
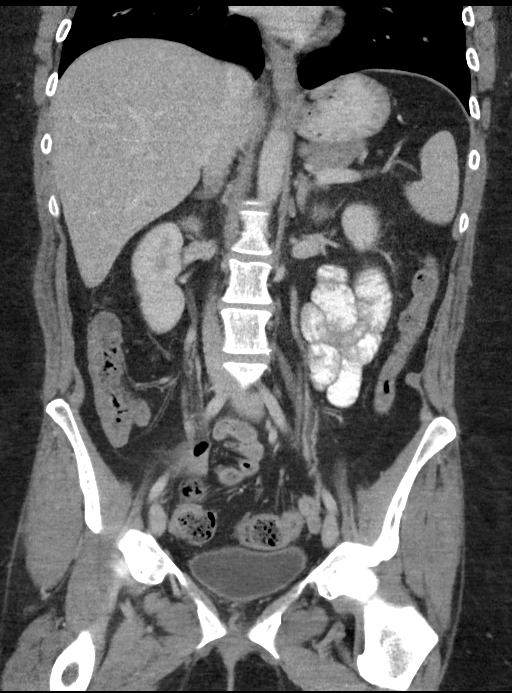

[16 of 46 positions shown; findings below may reference images not displayed]

FINDINGS: Lower chest: No acute abnormality.

Hepatobiliary: The patient is status post cholecystectomy. The liver
and portal vein are normal.

Pancreas: Low-attenuation in the pancreatic head is identified. It
is ill defined and stable, likely due to fatty infiltration. No
peripancreatic stranding to suggest pancreatitis.

Spleen: Normal in size without focal abnormality.

Adrenals/Urinary Tract: Adrenal glands are unremarkable. Kidneys are
normal, without renal calculi, focal lesion, or hydronephrosis.
Bladder is unremarkable.

Stomach/Bowel: Stomach is within normal limits. Appendix appears
normal. No evidence of bowel wall thickening, distention, or
inflammatory changes.

Vascular/Lymphatic: No significant vascular findings are present. No
enlarged abdominal or pelvic lymph nodes.

Reproductive: Uterus is normal. A small normal-size follicle seen in
the left ovary. A rounded cystic region is seen in the posterior
aspect of the right ovary measuring 4.3 by 2.1 cm.

Other: A small amount of free fluid in the pelvis is likely
physiologic.

Musculoskeletal: Severe degenerative changes at L5-S1, unchanged. No
other acute bony abnormalities.
IMPRESSION: 1. Dominant follicle/cyst in the right ovary measuring up to 4.3 cm.
This could be the source of the patient's right lower quadrant pain.
An ultrasound could better evaluate.

## 2018-03-07 ENCOUNTER — Other Ambulatory Visit: Payer: Self-pay | Admitting: Family Medicine

## 2018-03-07 DIAGNOSIS — R000 Tachycardia, unspecified: Secondary | ICD-10-CM

## 2018-03-10 ENCOUNTER — Other Ambulatory Visit: Payer: Self-pay | Admitting: Primary Care

## 2018-03-10 DIAGNOSIS — R000 Tachycardia, unspecified: Secondary | ICD-10-CM

## 2018-04-05 ENCOUNTER — Other Ambulatory Visit: Payer: Self-pay | Admitting: Primary Care

## 2018-04-05 DIAGNOSIS — R000 Tachycardia, unspecified: Secondary | ICD-10-CM

## 2018-07-07 ENCOUNTER — Emergency Department: Payer: Medicaid Other

## 2018-07-07 ENCOUNTER — Emergency Department
Admission: EM | Admit: 2018-07-07 | Discharge: 2018-07-07 | Disposition: A | Discharge status: Home / Self Care | Payer: Medicaid Other | Attending: Emergency Medicine | Admitting: Emergency Medicine

## 2018-07-07 ENCOUNTER — Encounter: Payer: Self-pay | Admitting: Emergency Medicine

## 2018-07-07 DIAGNOSIS — R252 Cramp and spasm: Secondary | ICD-10-CM | POA: Insufficient documentation

## 2018-07-07 DIAGNOSIS — Z85828 Personal history of other malignant neoplasm of skin: Secondary | ICD-10-CM | POA: Diagnosis not present

## 2018-07-07 DIAGNOSIS — R1084 Generalized abdominal pain: Secondary | ICD-10-CM | POA: Insufficient documentation

## 2018-07-07 DIAGNOSIS — R202 Paresthesia of skin: Secondary | ICD-10-CM | POA: Diagnosis not present

## 2018-07-07 DIAGNOSIS — Z91040 Latex allergy status: Secondary | ICD-10-CM | POA: Diagnosis not present

## 2018-07-07 DIAGNOSIS — R000 Tachycardia, unspecified: Secondary | ICD-10-CM | POA: Diagnosis not present

## 2018-07-07 DIAGNOSIS — F17210 Nicotine dependence, cigarettes, uncomplicated: Secondary | ICD-10-CM | POA: Insufficient documentation

## 2018-07-07 DIAGNOSIS — R0600 Dyspnea, unspecified: Secondary | ICD-10-CM | POA: Insufficient documentation

## 2018-07-07 DIAGNOSIS — Z79899 Other long term (current) drug therapy: Secondary | ICD-10-CM | POA: Diagnosis not present

## 2018-07-07 DIAGNOSIS — R002 Palpitations: Secondary | ICD-10-CM | POA: Insufficient documentation

## 2018-07-07 LAB — BASIC METABOLIC PANEL
Anion gap: 16 — ABNORMAL HIGH (ref 5–15)
BUN: 13 mg/dL (ref 6–20)
CO2: 23 mmol/L (ref 22–32)
CREATININE: 0.82 mg/dL (ref 0.44–1.00)
Calcium: 9.2 mg/dL (ref 8.9–10.3)
Chloride: 102 mmol/L (ref 98–111)
GFR calc Af Amer: >60 mL/min (ref >60)
GFR calc non Af Amer: >60 mL/min (ref >60)
GLUCOSE: 110 mg/dL — AB (ref 70–99)
Potassium: 3.4 mmol/L — ABNORMAL LOW (ref 3.5–5.1)
Sodium: 141 mmol/L (ref 135–145)

## 2018-07-07 LAB — CBC
HCT: 41.1 % (ref 35.0–47.0)
Hemoglobin: 14.4 g/dL (ref 12.0–16.0)
MCH: 33.3 pg (ref 26.0–34.0)
MCHC: 35 g/dL (ref 32.0–36.0)
MCV: 95.1 fL (ref 80.0–100.0)
PLATELETS: 330 10*3/uL (ref 150–440)
RBC: 4.32 MIL/uL (ref 3.80–5.20)
RDW: 14.4 % (ref 11.5–14.5)
WBC: 11 10*3/uL (ref 3.6–11.0)

## 2018-07-07 LAB — FIBRIN DERIVATIVES D-DIMER (ARMC ONLY): Fibrin derivatives D-dimer (ARMC): 199.55 ng/mL (FEU) (ref 0.00–499.00)

## 2018-07-07 LAB — TROPONIN I: Troponin I: <0.03 ng/mL (ref <0.03)

## 2018-07-07 MED ORDER — ONDANSETRON HCL 4 MG/2ML IJ SOLN
4.0000 mg | Freq: Once | INTRAMUSCULAR | Status: AC
  Administered 2018-07-07: 4 mg via INTRAVENOUS
  Filled 2018-07-07: qty 2

## 2018-07-07 MED ORDER — LORAZEPAM 2 MG/ML IJ SOLN
1.0000 mg | Freq: Once | INTRAMUSCULAR | Status: AC
  Administered 2018-07-07: 1 mg via INTRAVENOUS
  Filled 2018-07-07: qty 1

## 2018-07-07 NOTE — ED Notes (Signed)
Pt to MRI. ABCs intact. NAD

## 2018-07-07 NOTE — ED Notes (Signed)
Pt returned from MRI. ABCs intact. NAD.

## 2018-07-07 NOTE — ED Triage Notes (Signed)
Patient presents to the ED with feeling like her heart is racing and shortness of breath with tingling/numbness in her feet and patient's hands are both cramped.  Patient appears anxious but does not appear to be actively hyperventilating.  Speaking in full sentences without difficulty.

## 2018-07-07 NOTE — Discharge Instructions (Signed)
Please return for any further problems.  Please follow-up with your regular doctor later this week.  All the test we did today have been okay.

## 2018-07-07 NOTE — ED Provider Notes (Signed)
[facility] Emergency Department Provider Note   ____________________________________________   First MD Initiated Contact with Patient 07/07/18 1406     (approximate)  I have reviewed the triage vital signs and the nursing notes.   HISTORY  Chief Complaint Shortness of Breath; Abdominal Cramping; and Tachycardia    HPI Katherine Murray is a 37 y.o. female who reports she felt her heart racing and got shortness of breath.  She started to having tingling in her face and her hands.  She went to pick up her daughter from school but she then developed chronic numbness in her left arm and in her hands began to cramp so she called someone to come pick up her daughter she drove herself here.  When she got here her hands were cramping up in her legs at got numb from the toes upwards.  Her heart had been racing and she had been having some cramping in her abdomen as well.  Now she feels better but her hands are still cramping in her legs and toes are still numb.  She is speaking in full sentences.  She is normal sinus rhythm rate of 103 104.   Past Medical History:  Diagnosis Date  . A-fib ([place])   . Cancer ([place])    skin  . Diverticulosis   . Gallstones   . Headache   . Heart rate fast   . Kidney stones   . Lower abdominal pain    Right  . Mitral regurgitation    Mild  . Ovarian cyst 09/16/2016   right measuring 4.4 cm  . Pneumonia   . Tricuspid regurgitation    Trace  . Vaginal infection     Patient Active Problem List   Diagnosis Date Noted  . Back pain 05/16/2017  . GAD (generalized anxiety disorder) 02/13/2017  . Preventative health care 10/04/2016  . Hyperlipidemia 10/04/2016  . Pelvic adhesive disease 09/24/2016  . Endometriosis determined by laparoscopy 09/24/2016  . Skin abnormality 08/01/2016  . Tachycardia 08/01/2016  . Encounter for Depo-Provera contraception 08/01/2016  . Migraines 08/01/2016    Past Surgical History:  Procedure  Laterality Date  . CESAREAN SECTION    . CHOLECYSTECTOMY    . GALLBLADDER SURGERY    . LAPAROSCOPIC OVARIAN CYSTECTOMY Right 09/24/2016   Procedure: LAPAROSCOPIC RIGHT OVARIAN CYSTECTOMY, Excision of ovarian cyst, Excision of endometeriosis,;  Surgeon: Brayton Mars, MD;  Location: [facility] ORS;  Service: Gynecology;  Laterality: Right;  . TONSILLECTOMY      Prior to Admission medications   Medication Sig Start Date End Date Taking? Authorizing Provider  butalbital-acetaminophen-caffeine (FIORICET, ESGIC) 50-325-40 MG tablet Take 1 tablet by mouth 2 (two) times daily as needed for migraine. Patient not taking: Reported on 07/07/2018 01/24/17   Pleas Koch, NP  escitalopram (LEXAPRO) 10 MG tablet TAKE 1 TABLET(10 MG) BY MOUTH DAILY Patient not taking: Reported on 07/07/2018 08/16/17   Tonia Ghent, MD  ibuprofen (ADVIL,MOTRIN) 800 MG tablet Take 1 tablet (800 mg total) by mouth every 8 (eight) hours as needed. Patient not taking: Reported on 07/07/2018 05/18/17   [name], MD  metoprolol tartrate (LOPRESSOR) 50 MG tablet Take 1 tablet (50 mg total) by mouth 2 (two) times daily. NEED APPOINTMENT FOR ANY MORE REFILLS Patient not taking: Reported on 07/07/2018 03/10/18   Pleas Koch, NP  ondansetron (ZOFRAN ODT) 4 MG disintegrating tablet Take 1 tablet (4 mg total) by mouth every 8 (eight) hours as needed for nausea  or vomiting. Patient not taking: Reported on 07/07/2018 05/28/17   Pleas Koch, NP    Allergies Lyrica [pregabalin]; Eggs or egg-derived products; and Latex  Family History  Problem Relation Age of Onset  . Colon cancer Paternal Grandmother   . Diabetes Paternal Grandmother   . Ulcerative colitis Unknown   . Kidney disease Unknown   . Stomach cancer Maternal Grandfather   . Diabetes Mother   . Diabetes Father   . Ovarian cancer Maternal Grandmother   . Breast cancer Neg Hx     Social History Social History   Tobacco Use  . Smoking status:  Current Some Day Smoker    Packs/day: 0.10    Years: 7.00    Pack years: 0.70    Types: Cigarettes  . Smokeless tobacco: Never Used  . Tobacco comment: smokes "once or twice a week"  Substance Use Topics  . Alcohol use: Yes    Comment: rare  . Drug use: No    Review of Systems  Constitutional: No fever/chills Eyes: No visual changes. ENT: No sore throat. Cardiovascular: Denies chest pain. Respiratory:shortness of breath. Gastrointestinal:  abdominal pain.  No nausea, no vomiting.  No diarrhea.  No constipation. Genitourinary: Negative for dysuria. Musculoskeletal: Negative for back pain. Skin: Negative for rash. Neurological: Negative for headaches, focal weakness   ____________________________________________   PHYSICAL EXAM:  VITAL SIGNS: ED Triage Vitals  Enc Vitals Group     BP 07/07/18 1351 (!) 141/91     Pulse Rate 07/07/18 1351 (!) 140     Resp 07/07/18 1351 18     Temp 07/07/18 1351 98.1 F (36.7 C)     Temp Source 07/07/18 1351 Oral     SpO2 07/07/18 1351 100 %     Weight 07/07/18 1355 192 lb (87.1 kg)     Height 07/07/18 1355 5\' 5"  (1.651 m)     Head Circumference --      Peak Flow --      Pain Score 07/07/18 1351 7     Pain Loc --      Pain Edu? --      Excl. in [place]? --     Constitutional: Alert and oriented. Well appearing and in no acute distress. Eyes: Conjunctivae are normal.  Head: Atraumatic. Nose: No congestion/rhinnorhea. Mouth/Throat: Mucous membranes are moist.  Oropharynx non-erythematous. Neck: No stridor. Cardiovascular: Slightly fast rate, regular rhythm. Grossly normal heart sounds.  Good peripheral circulation. Respiratory: Normal respiratory effort.  No retractions. Lungs CTAB. Gastrointestinal: Soft and nontender. No distention. No abdominal bruits. No CVA tenderness. Musculoskeletal: No lower extremity tenderness nor edema.  No joint effusions. Neurologic:  Normal speech and language.  Patient complains of left arm and leg  numbness.  Cranial nerves II through XII are intact although visual fields were not checked cerebellar finger-nose is normal rapid alternating movements and hands are normal motor strength is 5 out of 5 throughout but again the left arm and leg are numb.  Seems to have progressed from initially when to move the left arm was numb Skin:  Skin is warm, dry and intact. No rash noted. Psychiatric: Mood and affect are normal. Speech and behavior are normal.  ____________________________________________   LABS (all labs ordered are listed, but only abnormal results are displayed)  Labs Reviewed  BASIC METABOLIC PANEL - Abnormal; Notable for the following components:      Result Value   Potassium 3.4 (*)    Glucose, Bld 110 (*)  Anion gap 16 (*)    All other components within normal limits  CBC  TROPONIN I  FIBRIN DERIVATIVES D-DIMER ([facility] ONLY)   ____________________________________________  EKG  EKG read and interpreted by me shows sinus tachycardia rate of 114 normal axis nonspecific ST T wave changes. ____________________________________________  RADIOLOGY  ED MD interpretation: X-ray read by radiology reviewed by me is negative MRI of the head was negative as well.  Official radiology report(s): Dg Chest 2 View  Result Date: 07/07/2018 CLINICAL DATA:  Chest pain.  Palpitations and shortness of breath. EXAM: CHEST - 2 VIEW COMPARISON:  02/11/2017 FINDINGS: The cardiomediastinal silhouette is within normal limits. The lungs are well inflated and clear. There is no evidence of pleural effusion or pneumothorax. No acute osseous abnormality is identified. IMPRESSION: No active cardiopulmonary disease. Electronically Signed   By: Logan Bores M.D.   On: 07/07/2018 14:21   Mr Brain Wo Contrast  Result Date: 07/07/2018 CLINICAL DATA:  Tachycardia, hand numbness and cramping. History of Chiari 1 malformation, atrial fibrillation, headache. EXAM: MRI HEAD WITHOUT CONTRAST TECHNIQUE:  Multiplanar, multiecho pulse sequences of the brain and surrounding structures were obtained without intravenous contrast. COMPARISON:  MRI of the head May 31, 2017 FINDINGS: INTRACRANIAL CONTENTS: No reduced diffusion to suggest acute ischemia or hyperacute demyelination. No susceptibility artifact to suggest hemorrhage. The ventricles and sulci are normal for patient's age. No suspicious parenchymal signal, masses, mass effect. No abnormal extra-axial fluid collections. No extra-axial masses. Cerebellar tonsils descend 14 mm below the foramen magnum with pointed appearance and mildly efface cerebral spinal fluid signal at the foramen magnum. VASCULAR: Normal major intracranial vascular flow voids present at skull base. SKULL AND UPPER CERVICAL SPINE: No abnormal sellar expansion. No suspicious calvarial bone marrow signal. Craniocervical junction maintained. SINUSES/ORBITS: Chronic LEFT mastoid effusion. Mild RIGHT maxillary sinus mucosal thickening. Included ocular globes and orbital contents are non-suspicious. OTHER: None. IMPRESSION: 1. No acute intracranial process. 2. Stable findings of Chiari 1 malformation. 3. Otherwise negative noncontrast MRI head. Electronically Signed   By: Elon Alas M.D.   On: 07/07/2018 17:00    ____________________________________________   PROCEDURES  Procedure(s) performed:   Procedures  Critical Care performed:   ____________________________________________   INITIAL IMPRESSION / ASSESSMENT AND PLAN / ED COURSE  Patient is numbness and crampiness in the arms and legs is consistent with hyperventilation except the fact that later the left arm and left leg were numb on the right side have improved.  Because of this change in the focality of it I will get an MRI of the brain.  Patient does still seem to be very anxious when I begin talking to her heart rate goes from 98 up to 114 and she becomes all shaky and as I explained to her that all the tests are  negative and we are just waiting for 1 more thing I will get the MRI to make sure everything looks good her shakiness improves and her heart rate goes back down in the upper 90s.  ----------------------------------------- 5:21 PM on 07/07/2018 -----------------------------------------  At this time patient is feeling well.  Tested been negative.  I will let her go.  She can follow-up with her regular physician.  She did not complain to me or the nurse of any chest pain.       ____________________________________________   FINAL CLINICAL IMPRESSION(S) / ED DIAGNOSES  Final diagnoses:  Dyspnea, unspecified type   Actual diagnosis is dyspnea resolved  ED Discharge Orders  None       Note:  This document was prepared using Dragon voice recognition software and may include unintentional dictation errors.    Nena Polio, MD 07/07/18 [phone]

## 2018-07-07 NOTE — ED Notes (Signed)
Pts jewelry (multiple bracelets) and tongue piercing removed. Pt denies any other jewelry/metal.

## 2018-07-07 NOTE — ED Notes (Signed)
Pt feeling nauseated after returning from XR. MD made aware.

## 2018-08-09 ENCOUNTER — Emergency Department
Admission: EM | Admit: 2018-08-09 | Discharge: 2018-08-10 | Disposition: A | Discharge status: Home / Self Care | Payer: Medicaid Other | Attending: Emergency Medicine | Admitting: Emergency Medicine

## 2018-08-09 ENCOUNTER — Other Ambulatory Visit: Payer: Self-pay

## 2018-08-09 ENCOUNTER — Encounter: Payer: Self-pay | Admitting: Emergency Medicine

## 2018-08-09 ENCOUNTER — Emergency Department: Payer: Medicaid Other

## 2018-08-09 DIAGNOSIS — R109 Unspecified abdominal pain: Secondary | ICD-10-CM

## 2018-08-09 DIAGNOSIS — F17210 Nicotine dependence, cigarettes, uncomplicated: Secondary | ICD-10-CM | POA: Insufficient documentation

## 2018-08-09 DIAGNOSIS — Z91040 Latex allergy status: Secondary | ICD-10-CM | POA: Diagnosis not present

## 2018-08-09 DIAGNOSIS — Z79899 Other long term (current) drug therapy: Secondary | ICD-10-CM | POA: Insufficient documentation

## 2018-08-09 LAB — URINALYSIS, COMPLETE (UACMP) WITH MICROSCOPIC
Bilirubin Urine: NEGATIVE
Glucose, UA: NEGATIVE mg/dL
KETONES UR: NEGATIVE mg/dL
Leukocytes, UA: NEGATIVE
Nitrite: POSITIVE — AB
Protein, ur: NEGATIVE mg/dL
Specific Gravity, Urine: 1.02 (ref 1.005–1.030)
pH: 6 (ref 5.0–8.0)

## 2018-08-09 LAB — BLOOD GAS, VENOUS
Acid-base deficit: 1.5 mmol/L (ref 0.0–2.0)
Bicarbonate: 19.9 mmol/L — ABNORMAL LOW (ref 20.0–28.0)
O2 Saturation: 89.9 %
PH VEN: 7.51 — AB (ref 7.250–7.430)
Patient temperature: 37
pCO2, Ven: 25 mmHg — ABNORMAL LOW (ref 44.0–60.0)
pO2, Ven: 52 mmHg — ABNORMAL HIGH (ref 32.0–45.0)

## 2018-08-09 LAB — COMPREHENSIVE METABOLIC PANEL
ALT: 33 U/L (ref 0–44)
ANION GAP: 13 (ref 5–15)
AST: 50 U/L — ABNORMAL HIGH (ref 15–41)
Albumin: 4.7 g/dL (ref 3.5–5.0)
Alkaline Phosphatase: 44 U/L (ref 38–126)
BILIRUBIN TOTAL: 0.9 mg/dL (ref 0.3–1.2)
BUN: 11 mg/dL (ref 6–20)
CHLORIDE: 103 mmol/L (ref 98–111)
CO2: 23 mmol/L (ref 22–32)
Calcium: 9.2 mg/dL (ref 8.9–10.3)
Creatinine, Ser: 0.72 mg/dL (ref 0.44–1.00)
GFR calc Af Amer: >60 mL/min (ref >60)
GFR calc non Af Amer: >60 mL/min (ref >60)
GLUCOSE: 110 mg/dL — AB (ref 70–99)
POTASSIUM: 3.8 mmol/L (ref 3.5–5.1)
Sodium: 139 mmol/L (ref 135–145)
Total Protein: 7.8 g/dL (ref 6.5–8.1)

## 2018-08-09 LAB — URINE DRUG SCREEN, QUALITATIVE (ARMC ONLY)
AMPHETAMINES, UR SCREEN: NOT DETECTED
Barbiturates, Ur Screen: NOT DETECTED
Benzodiazepine, Ur Scrn: NOT DETECTED
Cannabinoid 50 Ng, Ur ~~LOC~~: NOT DETECTED
Cocaine Metabolite,Ur ~~LOC~~: NOT DETECTED
MDMA (ECSTASY) UR SCREEN: NOT DETECTED
METHADONE SCREEN, URINE: NOT DETECTED
Opiate, Ur Screen: NOT DETECTED
Phencyclidine (PCP) Ur S: NOT DETECTED
Tricyclic, Ur Screen: NOT DETECTED

## 2018-08-09 LAB — CBC WITH DIFFERENTIAL/PLATELET
Basophils Absolute: 0.1 10*3/uL (ref 0–0.1)
Basophils Relative: 1 %
EOS ABS: 0 10*3/uL (ref 0–0.7)
Eosinophils Relative: 0 %
HEMATOCRIT: 43.8 % (ref 35.0–47.0)
Hemoglobin: 15.5 g/dL (ref 12.0–16.0)
LYMPHS ABS: 3.2 10*3/uL (ref 1.0–3.6)
Lymphocytes Relative: 43 %
MCH: 33.1 pg (ref 26.0–34.0)
MCHC: 35.4 g/dL (ref 32.0–36.0)
MCV: 93.5 fL (ref 80.0–100.0)
MONOS PCT: 4 %
Monocytes Absolute: 0.3 10*3/uL (ref 0.2–0.9)
Neutro Abs: 3.8 10*3/uL (ref 1.4–6.5)
Neutrophils Relative %: 52 %
Platelets: 329 10*3/uL (ref 150–440)
RBC: 4.68 MIL/uL (ref 3.80–5.20)
RDW: 15 % — ABNORMAL HIGH (ref 11.5–14.5)
WBC: 7.5 10*3/uL (ref 3.6–11.0)

## 2018-08-09 LAB — POCT PREGNANCY, URINE: Preg Test, Ur: NEGATIVE

## 2018-08-09 LAB — LACTIC ACID, PLASMA
LACTIC ACID, VENOUS: 3.6 mmol/L — AB (ref 0.5–1.9)
Lactic Acid, Venous: 2.4 mmol/L (ref 0.5–1.9)

## 2018-08-09 LAB — LIPASE, BLOOD: LIPASE: 21 U/L (ref 11–51)

## 2018-08-09 LAB — GLUCOSE, CAPILLARY: GLUCOSE-CAPILLARY: 111 mg/dL — AB (ref 70–99)

## 2018-08-09 MED ORDER — PROMETHAZINE HCL 25 MG/ML IJ SOLN
12.5000 mg | Freq: Once | INTRAMUSCULAR | Status: AC
  Administered 2018-08-09: 12.5 mg via INTRAVENOUS
  Filled 2018-08-09: qty 1

## 2018-08-09 MED ORDER — SODIUM CHLORIDE 0.9 % IV BOLUS
1000.0000 mL | Freq: Once | INTRAVENOUS | Status: AC
  Administered 2018-08-09: 1000 mL via INTRAVENOUS

## 2018-08-09 MED ORDER — ONDANSETRON HCL 4 MG/2ML IJ SOLN
4.0000 mg | Freq: Once | INTRAMUSCULAR | Status: AC | PRN
  Administered 2018-08-09: 4 mg via INTRAVENOUS
  Filled 2018-08-09: qty 2

## 2018-08-09 MED ORDER — IOPAMIDOL (ISOVUE-300) INJECTION 61%
100.0000 mL | Freq: Once | INTRAVENOUS | Status: AC | PRN
  Administered 2018-08-09: 100 mL via INTRAVENOUS

## 2018-08-09 MED ORDER — IOPAMIDOL (ISOVUE-300) INJECTION 61%
30.0000 mL | Freq: Once | INTRAVENOUS | Status: AC | PRN
  Administered 2018-08-09: 30 mL via ORAL

## 2018-08-09 MED ORDER — MORPHINE SULFATE (PF) 4 MG/ML IV SOLN
4.0000 mg | Freq: Once | INTRAVENOUS | Status: AC
  Administered 2018-08-09: 4 mg via INTRAVENOUS
  Filled 2018-08-09: qty 1

## 2018-08-09 NOTE — ED Provider Notes (Addendum)
[facility] Emergency Department Provider Note   ____________________________________________   None    (approximate)  I have reviewed the triage vital signs and the nursing notes.   HISTORY  Chief Complaint Abdominal Pain   HPI Katherine Murray is a 37 y.o. female who reports she started vomiting on Thursday.  On Friday she got left lower quadrant abdominal pain she is sicker today she has not had anything that she could keep down since Thursday.  The pain in the left lower quadrant is sharp and crampy.  She has not had a fever.  She has a history of diverticulitis and cholecystectomy.  She had a normal stool yesterday.  Pain on the left side is exquisite.  Severe and as I said sharp.   Past Medical History:  Diagnosis Date  . A-fib ([place])   . Cancer ([place])    skin  . Diverticulosis   . Gallstones   . Headache   . Heart rate fast   . Kidney stones   . Lower abdominal pain    Right  . Mitral regurgitation    Mild  . Ovarian cyst 09/16/2016   right measuring 4.4 cm  . Pneumonia   . Tricuspid regurgitation    Trace  . Vaginal infection     Patient Active Problem List   Diagnosis Date Noted  . Back pain 05/16/2017  . GAD (generalized anxiety disorder) 02/13/2017  . Preventative health care 10/04/2016  . Hyperlipidemia 10/04/2016  . Pelvic adhesive disease 09/24/2016  . Endometriosis determined by laparoscopy 09/24/2016  . Skin abnormality 08/01/2016  . Tachycardia 08/01/2016  . Encounter for Depo-Provera contraception 08/01/2016  . Migraines 08/01/2016    Past Surgical History:  Procedure Laterality Date  . CESAREAN SECTION    . CHOLECYSTECTOMY    . GALLBLADDER SURGERY    . LAPAROSCOPIC OVARIAN CYSTECTOMY Right 09/24/2016   Procedure: LAPAROSCOPIC RIGHT OVARIAN CYSTECTOMY, Excision of ovarian cyst, Excision of endometeriosis,;  Surgeon: Brayton Mars, MD;  Location: [facility] ORS;  Service: Gynecology;  Laterality: Right;  .  TONSILLECTOMY      Prior to Admission medications   Medication Sig Start Date End Date Taking? Authorizing Provider  butalbital-acetaminophen-caffeine (FIORICET, ESGIC) 50-325-40 MG tablet Take 1 tablet by mouth 2 (two) times daily as needed for migraine. Patient not taking: Reported on 07/07/2018 01/24/17   Pleas Koch, NP  escitalopram (LEXAPRO) 10 MG tablet TAKE 1 TABLET(10 MG) BY MOUTH DAILY Patient not taking: Reported on 07/07/2018 08/16/17   Tonia Ghent, MD  ibuprofen (ADVIL,MOTRIN) 800 MG tablet Take 1 tablet (800 mg total) by mouth every 8 (eight) hours as needed. Patient not taking: Reported on 07/07/2018 05/18/17   [name], MD  metoprolol tartrate (LOPRESSOR) 50 MG tablet Take 1 tablet (50 mg total) by mouth 2 (two) times daily. NEED APPOINTMENT FOR ANY MORE REFILLS Patient not taking: Reported on 07/07/2018 03/10/18   Pleas Koch, NP  ondansetron (ZOFRAN ODT) 4 MG disintegrating tablet Take 1 tablet (4 mg total) by mouth every 8 (eight) hours as needed for nausea or vomiting. Patient not taking: Reported on 07/07/2018 05/28/17   Pleas Koch, NP  ondansetron (ZOFRAN ODT) 4 MG disintegrating tablet Take 1 tablet (4 mg total) by mouth every 8 (eight) hours as needed for nausea or vomiting. 08/10/18   Nena Polio, MD  oxyCODONE-acetaminophen (PERCOCET) 5-325 MG tablet Take 1 tablet by mouth every 4 (four) hours as needed for severe pain. 08/10/18 08/10/19  Nena Polio, MD    Allergies Lyrica [pregabalin]; Eggs or egg-derived products; and Latex  Family History  Problem Relation Age of Onset  . Colon cancer Paternal Grandmother   . Diabetes Paternal Grandmother   . Ulcerative colitis Unknown   . Kidney disease Unknown   . Stomach cancer Maternal Grandfather   . Diabetes Mother   . Diabetes Father   . Ovarian cancer Maternal Grandmother   . Breast cancer Neg Hx     Social History Social History   Tobacco Use  . Smoking status: Current Some Day  Smoker    Packs/day: 0.10    Years: 7.00    Pack years: 0.70    Types: Cigarettes  . Smokeless tobacco: Never Used  . Tobacco comment: smokes "once or twice a week"  Substance Use Topics  . Alcohol use: Yes    Comment: rare  . Drug use: No    Review of Systems  Constitutional: No fever/chills Eyes: No visual changes. ENT: No sore throat. Cardiovascular: Denies chest pain. Respiratory: Denies shortness of breath. Gastrointestinal: See HPI Genitourinary: Negative for dysuria. Musculoskeletal: Negative for back pain. Skin: Negative for rash. Neurological: Negative for headaches, focal weakness   ____________________________________________   PHYSICAL EXAM:  VITAL SIGNS: ED Triage Vitals  Enc Vitals Group     BP 08/09/18 1808 123/77     Pulse Rate 08/09/18 1808 (!) 107     Resp 08/09/18 1808 (!) 22     Temp 08/09/18 1808 98.4 F (36.9 C)     Temp Source 08/09/18 1808 Oral     SpO2 08/09/18 1808 100 %     Weight 08/09/18 1809 192 lb (87.1 kg)     Height 08/09/18 1809 5\' 5"  (1.651 m)     Head Circumference --      Peak Flow --      Pain Score 08/09/18 1809 9     Pain Loc --      Pain Edu? --      Excl. in [place]? --     Constitutional: Alert and oriented.  Ill-appearing and in pain Eyes: Conjunctivae are normal. Head: Atraumatic. Nose: No congestion/rhinnorhea. Mouth/Throat: Mucous membranes are moist.  Oropharynx non-erythematous. Neck: No stridor.  Cardiovascular: Normal rate, regular rhythm. Grossly normal heart sounds.  Good peripheral circulation. Respiratory: Normal respiratory effort.  No retractions. Lungs CTAB. Gastrointestinal: Soft very tender to palpation and percussion in the left lower quadrant the whole belly is tender but much less so than in the left lower quadrant no distention. No abdominal bruits. No CVA tenderness. Musculoskeletal: No lower extremity tenderness nor edema.   Neurologic:  Normal speech and language. No gross focal neurologic  deficits are appreciated Skin:  Skin is warm, dry and intact. No rash noted. Psychiatric: Mood and affect are normal. Speech and behavior are normal.  ____________________________________________   LABS (all labs ordered are listed, but only abnormal results are displayed)  Labs Reviewed  LACTIC ACID, PLASMA - Abnormal; Notable for the following components:      Result Value   Lactic Acid, Venous 3.6 (*)    All other components within normal limits  LACTIC ACID, PLASMA - Abnormal; Notable for the following components:   Lactic Acid, Venous 2.4 (*)    All other components within normal limits  COMPREHENSIVE METABOLIC PANEL - Abnormal; Notable for the following components:   Glucose, Bld 110 (*)    AST 50 (*)    All other components within normal limits  CBC WITH DIFFERENTIAL/PLATELET - Abnormal; Notable for the following components:   RDW 15.0 (*)    All other components within normal limits  URINALYSIS, COMPLETE (UACMP) WITH MICROSCOPIC - Abnormal; Notable for the following components:   Color, Urine YELLOW (*)    APPearance HAZY (*)    Hgb urine dipstick MODERATE (*)    Nitrite POSITIVE (*)    Bacteria, UA FEW (*)    All other components within normal limits  GLUCOSE, CAPILLARY - Abnormal; Notable for the following components:   Glucose-Capillary 111 (*)    All other components within normal limits  BLOOD GAS, VENOUS - Abnormal; Notable for the following components:   pH, Ven 7.51 (*)    pCO2, Ven 25 (*)    pO2, Ven 52.0 (*)    Bicarbonate 19.9 (*)    All other components within normal limits  LIPASE, BLOOD  URINE DRUG SCREEN, QUALITATIVE ([facility] ONLY)  POC URINE PREG, ED  POCT PREGNANCY, URINE   ____________________________________________  EKG  EKG read and interpreted by me shows normal sinus rhythm rate of 81 normal axis essentially normal EKG ____________________________________________  RADIOLOGY    Official radiology report(s): Ct Abdomen Pelvis W  Contrast  Result Date: 08/09/2018 CLINICAL DATA:  37 year old female with abdominal pain. Left lower quadrant cramping. EXAM: CT ABDOMEN AND PELVIS WITH CONTRAST TECHNIQUE: Multidetector CT imaging of the abdomen and pelvis was performed using the standard protocol following bolus administration of intravenous contrast. CONTRAST:  155mL ISOVUE-300 IOPAMIDOL (ISOVUE-300) INJECTION 61%, 70mL ISOVUE-300 IOPAMIDOL (ISOVUE-300) INJECTION 61% COMPARISON:  CT of the abdomen pelvis dated 09/16/2016 and pelvic ultrasound dated 09/16/2016 FINDINGS: Lower chest: The visualized lung bases are clear2. No intra-abdominal free air or free fluid. Hepatobiliary: The liver is unremarkable. No intrahepatic biliary ductal dilatation. Cholecystectomy. Pancreas: Unremarkable. No pancreatic ductal dilatation or surrounding inflammatory changes. Spleen: Normal in size without focal abnormality. Adrenals/Urinary Tract: Adrenal glands are unremarkable. Kidneys are normal, without renal calculi, focal lesion, or hydronephrosis. Bladder is unremarkable. Stomach/Bowel: There is sigmoid diverticulosis without active inflammatory changes. There is no bowel obstruction or active inflammation. Normal appendix. Vascular/Lymphatic: No significant vascular findings are present. No enlarged abdominal or pelvic lymph nodes. Reproductive: The uterus is anteverted and grossly unremarkable. Bilateral ovarian follicles noted. Other: None Musculoskeletal: Bilateral L5 pars defects with grade 1 L5-S1 anterolisthesis. No acute osseous pathology. IMPRESSION: No acute intra-abdominal or pelvic pathology. Sigmoid diverticulosis. No bowel obstruction or active inflammation. Normal appendix. Electronically Signed   By: Anner Crete M.D.   On: 08/09/2018 21:29    ____________________________________________   PROCEDURES  Procedure(s) performed:   Procedures  Critical Care performed:   ____________________________________________   INITIAL  IMPRESSION / ASSESSMENT AND PLAN / ED COURSE  Patient is now doing well she is tolerating p.o. the pain is much better she is not vomiting anymore all CT is negative lab work is normal and markedly improving.  I will let her go home she wants to.  She will return if she is worse.         ____________________________________________   FINAL CLINICAL IMPRESSION(S) / ED DIAGNOSES  Final diagnoses:  Abdominal pain, unspecified abdominal location     ED Discharge Orders         Ordered    ondansetron (ZOFRAN ODT) 4 MG disintegrating tablet  Every 8 hours PRN     08/10/18 0020    oxyCODONE-acetaminophen (PERCOCET) 5-325 MG tablet  Every 4 hours PRN     08/10/18 0020  Note:  This document was prepared using Dragon voice recognition software and may include unintentional dictation errors.    Nena Polio, MD 08/10/18 [place]    Nena Polio, MD 09/02/18 [phone]

## 2018-08-09 NOTE — ED Notes (Addendum)
Pt c/o of sharp cramping pain in LLQ with continuous N/V since Thursday, hx of diverticulitis, and cholecystectomy, last BM yesterday termed as normal  Pt unable to provide urine sample  Tender to light palp on left side, reports pan radiating to left flank, family hx of kidney stones, pt denies UTI type s/sx, or hx of kidney stones  Reports possible weakness in lower legs

## 2018-08-09 NOTE — ED Notes (Signed)
Patient transported to CT 

## 2018-08-09 NOTE — ED Triage Notes (Addendum)
Vomiting since Thursday. Unable to keep anything down. No fevers. LLQ abdominal pain. Hx diverticulitis. Unsure if feels same. No diarrhea.  Vomiting in triage.

## 2018-08-10 MED ORDER — OXYCODONE-ACETAMINOPHEN 5-325 MG PO TABS: 1.0000 | ORAL_TABLET | ORAL | 0 refills | Status: DC | PRN

## 2018-08-10 MED ORDER — ONDANSETRON 4 MG PO TBDP: 4.0000 mg | ORAL_TABLET | Freq: Three times a day (TID) | ORAL | 0 refills | Status: DC | PRN

## 2018-08-10 NOTE — Discharge Instructions (Signed)
Please return for worse pain fever or continued vomiting.  Tonight and tomorrow morning drinks sips of clear liquids in small amounts frequently.  After that you can try the brat diet bananas rice applesauce toast and crackers.  If you can tolerate that well each your regular food.  Use the Zofran melt on your tongue wafers 3 times a day as needed for nausea.  Use the Percocet as needed for pain.  Also return here if you are not back to normal in the next couple days.

## 2018-08-10 NOTE — ED Notes (Signed)
Discharge instructions reviewed with patient. Questions fielded by this RN. Patient verbalizes understanding of instructions. Patient discharged home in stable condition per [facility]. No acute distress noted at time of discharge.   Peripheral IV discontinued. Catheter intact. No signs of infiltration or redness. Gauze applied to IV site.   Pt ambulatory to DC

## 2018-12-10 ENCOUNTER — Encounter: Payer: Self-pay | Admitting: Emergency Medicine

## 2018-12-10 ENCOUNTER — Emergency Department
Admission: EM | Admit: 2018-12-10 | Discharge: 2018-12-10 | Disposition: A | Discharge status: Home / Self Care | Payer: Medicaid Other | Attending: Emergency Medicine | Admitting: Emergency Medicine

## 2018-12-10 DIAGNOSIS — R509 Fever, unspecified: Secondary | ICD-10-CM | POA: Diagnosis present

## 2018-12-10 DIAGNOSIS — F17210 Nicotine dependence, cigarettes, uncomplicated: Secondary | ICD-10-CM | POA: Insufficient documentation

## 2018-12-10 DIAGNOSIS — J111 Influenza due to unidentified influenza virus with other respiratory manifestations: Secondary | ICD-10-CM

## 2018-12-10 MED ORDER — HYDROCODONE-HOMATROPINE 5-1.5 MG/5ML PO SYRP: 5.0000 mL | ORAL_SOLUTION | Freq: Four times a day (QID) | ORAL | 0 refills | Status: DC | PRN

## 2018-12-10 MED ORDER — OSELTAMIVIR PHOSPHATE 75 MG PO CAPS: 75.0000 mg | ORAL_CAPSULE | Freq: Two times a day (BID) | ORAL | 0 refills | Status: DC

## 2018-12-10 NOTE — Discharge Instructions (Addendum)
Follow-up with your regular doctor if not better in 3 days.  Return emergency department worsening.  You have influenza.  You should not return to work until you have been fever free for 24 hours.

## 2018-12-10 NOTE — ED Triage Notes (Signed)
Patient reports body aches and fever since Monday and now has developed a headache, sore throat, and cough.  Patient states her daughter was diagnosed with the flu on Friday.  Patient has dry cough in triage.

## 2018-12-10 NOTE — ED Notes (Signed)
Pt placed in bed, resting without signs of distress at this time.

## 2018-12-10 NOTE — ED Provider Notes (Signed)
[facility] Emergency Department Provider Note  ____________________________________________   First MD Initiated Contact with Patient 12/10/18 [phone]     (approximate)  I have reviewed the triage vital signs and the nursing notes.   HISTORY  Chief Complaint Sore Throat and Cough    HPI Katherine Murray is a 38 y.o. female presents to the emergency department with flulike symptoms, patient  complained of fever, chills, body aches, cough, sore throat, 1 episode of vomiting, denies diarrhea; denies chest pain or sob.  Sx for 1-2 days, she states that her daughter had the flu.  She was diagnosed on Friday.  She states her symptoms are the very same.   Past Medical History:  Diagnosis Date  . A-fib ([place])   . Cancer ([place])    skin  . Diverticulosis   . Gallstones   . Headache   . Heart rate fast   . Kidney stones   . Lower abdominal pain    Right  . Mitral regurgitation    Mild  . Ovarian cyst 09/16/2016   right measuring 4.4 cm  . Pneumonia   . Tricuspid regurgitation    Trace  . Vaginal infection     Patient Active Problem List   Diagnosis Date Noted  . Back pain 05/16/2017  . GAD (generalized anxiety disorder) 02/13/2017  . Preventative health care 10/04/2016  . Hyperlipidemia 10/04/2016  . Pelvic adhesive disease 09/24/2016  . Endometriosis determined by laparoscopy 09/24/2016  . Skin abnormality 08/01/2016  . Tachycardia 08/01/2016  . Encounter for Depo-Provera contraception 08/01/2016  . Migraines 08/01/2016    Past Surgical History:  Procedure Laterality Date  . CESAREAN SECTION    . CHOLECYSTECTOMY    . GALLBLADDER SURGERY    . LAPAROSCOPIC OVARIAN CYSTECTOMY Right 09/24/2016   Procedure: LAPAROSCOPIC RIGHT OVARIAN CYSTECTOMY, Excision of ovarian cyst, Excision of endometeriosis,;  Surgeon: Brayton Mars, MD;  Location: [facility] ORS;  Service: Gynecology;  Laterality: Right;  . TONSILLECTOMY      Prior to Admission  medications   Medication Sig Start Date End Date Taking? Authorizing Provider  HYDROcodone-homatropine (HYCODAN) 5-1.5 MG/5ML syrup Take 5 mLs by mouth every 6 (six) hours as needed. 12/10/18   Kurt Azimi, Linden Dolin, PA-C  oseltamivir (TAMIFLU) 75 MG capsule Take 1 capsule (75 mg total) by mouth 2 (two) times daily. 12/10/18   Versie Starks, PA-C    Allergies Lyrica [pregabalin]; Eggs or egg-derived products; and Latex  Family History  Problem Relation Age of Onset  . Colon cancer Paternal Grandmother   . Diabetes Paternal Grandmother   . Ulcerative colitis Other   . Kidney disease Other   . Stomach cancer Maternal Grandfather   . Diabetes Mother   . Diabetes Father   . Ovarian cancer Maternal Grandmother   . Breast cancer Neg Hx     Social History Social History   Tobacco Use  . Smoking status: Current Some Day Smoker    Packs/day: 0.10    Years: 7.00    Pack years: 0.70    Types: Cigarettes  . Smokeless tobacco: Never Used  . Tobacco comment: smokes "once or twice a week"  Substance Use Topics  . Alcohol use: Yes    Comment: rare  . Drug use: No    Review of Systems  Constitutional: Positive fever/chills Eyes: No visual changes. ENT: Positive sore throat. Respiratory: Positive cough Genitourinary: Negative for dysuria. Musculoskeletal: Negative for back pain. Skin: Negative for rash.  ____________________________________________   PHYSICAL EXAM:  VITAL SIGNS: ED Triage Vitals  Enc Vitals Group     BP 12/10/18 0844 128/87     Pulse Rate 12/10/18 0844 (!) 128     Resp 12/10/18 0844 20     Temp 12/10/18 0844 98.7 F (37.1 C)     Temp Source 12/10/18 0844 Oral     SpO2 12/10/18 0844 98 %     Weight 12/10/18 0845 187 lb (84.8 kg)     Height 12/10/18 0845 5\' 5"  (1.651 m)     Head Circumference --      Peak Flow --      Pain Score 12/10/18 0845 5     Pain Loc --      Pain Edu? --      Excl. in [place]? --     Constitutional: Alert and oriented. Well  appearing and in no acute distress. Eyes: Conjunctivae are normal.  Head: Atraumatic. Nose: No congestion/rhinnorhea. Mouth/Throat: Mucous membranes are moist.   Neck:  supple no lymphadenopathy noted Cardiovascular: Normal rate, regular rhythm. Heart sounds are normal Respiratory: Normal respiratory effort.  No retractions, lungs c t a, cough is dry and hacking GU: deferred Musculoskeletal: FROM all extremities, warm and well perfused Neurologic:  Normal speech and language.  Skin:  Skin is warm, dry and intact. No rash noted. Psychiatric: Mood and affect are normal. Speech and behavior are normal.  ____________________________________________   LABS (all labs ordered are listed, but only abnormal results are displayed)  Labs Reviewed - No data to display ____________________________________________   ____________________________________________  RADIOLOGY    ____________________________________________   PROCEDURES  Procedure(s) performed: No  Procedures    ____________________________________________   INITIAL IMPRESSION / ASSESSMENT AND PLAN / ED COURSE  Pertinent labs & imaging results that were available during my care of the patient were reviewed by me and considered in my medical decision making (see chart for details).   Patient is 38 year old female presents emergency department with flulike symptoms.  She states that her daughter was diagnosed with the flu on Friday.  Her symptoms started less than 48 hours ago  Physical exam shows that the patient is afebrile.  She does have a dry hacking cough.  Does not appear to feel well.  Explained to her that due to being exposed to the flu with flulike symptoms she has influenza.  Patient states she understands.  She was given a prescription for Tamiflu and Hycodan cough syrup.  She is to follow-up with her regular doctor if not better in 3 days.  Return to the emergency department if worsening.  She states she  understands and will comply.  She is given a work note.  She is discharged stable condition.     As part of my medical decision making, I reviewed the following data within the [place] notes reviewed and incorporated, Old chart reviewed, Notes from prior ED visits and [place] Controlled Substance Database  ____________________________________________   FINAL CLINICAL IMPRESSION(S) / ED DIAGNOSES  Final diagnoses:  Influenza      NEW MEDICATIONS STARTED DURING THIS VISIT:  New Prescriptions   HYDROCODONE-HOMATROPINE (HYCODAN) 5-1.5 MG/5ML SYRUP    Take 5 mLs by mouth every 6 (six) hours as needed.   OSELTAMIVIR (TAMIFLU) 75 MG CAPSULE    Take 1 capsule (75 mg total) by mouth 2 (two) times daily.     Note:  This document was prepared using Systems analyst and may include  unintentional dictation errors.    Versie Starks, PA-C 12/10/18 3085    Nance Pear, MD 12/10/18 [phone]

## 2019-07-01 ENCOUNTER — Encounter: Payer: Self-pay | Admitting: Emergency Medicine

## 2019-07-01 ENCOUNTER — Emergency Department: Payer: Medicaid Other

## 2019-07-01 ENCOUNTER — Other Ambulatory Visit: Payer: Self-pay

## 2019-07-01 ENCOUNTER — Emergency Department
Admission: EM | Admit: 2019-07-01 | Discharge: 2019-07-01 | Disposition: A | Discharge status: Home / Self Care | Payer: Medicaid Other | Attending: Emergency Medicine | Admitting: Emergency Medicine

## 2019-07-01 DIAGNOSIS — R05 Cough: Secondary | ICD-10-CM | POA: Diagnosis not present

## 2019-07-01 DIAGNOSIS — J029 Acute pharyngitis, unspecified: Secondary | ICD-10-CM | POA: Diagnosis not present

## 2019-07-01 DIAGNOSIS — F17210 Nicotine dependence, cigarettes, uncomplicated: Secondary | ICD-10-CM | POA: Insufficient documentation

## 2019-07-01 DIAGNOSIS — R059 Cough, unspecified: Secondary | ICD-10-CM

## 2019-07-01 MED ORDER — DEXAMETHASONE 10 MG/ML FOR PEDIATRIC ORAL USE
10.0000 mg | Freq: Once | INTRAMUSCULAR | Status: AC
  Administered 2019-07-01: 10 mg via ORAL
  Filled 2019-07-01: qty 1

## 2019-07-01 MED ORDER — HYDROCOD POLST-CPM POLST ER 10-8 MG/5ML PO SUER: 5.0000 mL | Freq: Two times a day (BID) | ORAL | 0 refills | Status: AC | PRN

## 2019-07-01 MED ORDER — HYDROCOD POLST-CPM POLST ER 10-8 MG/5ML PO SUER
5.0000 mL | Freq: Once | ORAL | Status: AC
  Administered 2019-07-01: 5 mL via ORAL
  Filled 2019-07-01: qty 5

## 2019-07-01 NOTE — ED Provider Notes (Signed)
[facility] Emergency Department Provider Note  ____________________________________________  Time seen: Approximately 10:45 PM  I have reviewed the triage vital signs and the nursing notes.   HISTORY  Chief Complaint Cough and Sore Throat    HPI Katherine Murray is a 38 y.o. female presents to the emergency department with persistent cough since this morning.  Patient has had COVID-19 testing earlier in the day but states that she has had multiple episodes of posttussive emesis and cannot hold down food or drink due to the intensity of cough.  She denies chest pain, chest tightness, shortness of breath or abdominal pain.  Patient is here for cough suppression.  No alleviating measures have been attempted at home.        Past Medical History:  Diagnosis Date  . A-fib ([place])   . Cancer ([place])    skin  . Diverticulosis   . Gallstones   . Headache   . Heart rate fast   . Kidney stones   . Lower abdominal pain    Right  . Mitral regurgitation    Mild  . Ovarian cyst 09/16/2016   right measuring 4.4 cm  . Pneumonia   . Tricuspid regurgitation    Trace  . Vaginal infection     Patient Active Problem List   Diagnosis Date Noted  . Back pain 05/16/2017  . GAD (generalized anxiety disorder) 02/13/2017  . Preventative health care 10/04/2016  . Hyperlipidemia 10/04/2016  . Pelvic adhesive disease 09/24/2016  . Endometriosis determined by laparoscopy 09/24/2016  . Skin abnormality 08/01/2016  . Tachycardia 08/01/2016  . Encounter for Depo-Provera contraception 08/01/2016  . Migraines 08/01/2016    Past Surgical History:  Procedure Laterality Date  . CESAREAN SECTION    . CHOLECYSTECTOMY    . GALLBLADDER SURGERY    . LAPAROSCOPIC OVARIAN CYSTECTOMY Right 09/24/2016   Procedure: LAPAROSCOPIC RIGHT OVARIAN CYSTECTOMY, Excision of ovarian cyst, Excision of endometeriosis,;  Surgeon: Brayton Mars, MD;  Location: [facility] ORS;  Service: Gynecology;   Laterality: Right;  . TONSILLECTOMY      Prior to Admission medications   Medication Sig Start Date End Date Taking? Authorizing Provider  chlorpheniramine-HYDROcodone (TUSSIONEX) 10-8 MG/5ML SUER Take 5 mLs by mouth every 12 (twelve) hours as needed for up to 5 days for cough. 07/01/19 07/06/19  Lannie Fields, PA-C  HYDROcodone-homatropine (HYCODAN) 5-1.5 MG/5ML syrup Take 5 mLs by mouth every 6 (six) hours as needed. 12/10/18   Fisher, Linden Dolin, PA-C  oseltamivir (TAMIFLU) 75 MG capsule Take 1 capsule (75 mg total) by mouth 2 (two) times daily. 12/10/18   Versie Starks, PA-C    Allergies Lyrica [pregabalin], Eggs or egg-derived products, and Latex  Family History  Problem Relation Age of Onset  . Colon cancer Paternal Grandmother   . Diabetes Paternal Grandmother   . Ulcerative colitis Other   . Kidney disease Other   . Stomach cancer Maternal Grandfather   . Diabetes Mother   . Diabetes Father   . Ovarian cancer Maternal Grandmother   . Breast cancer Neg Hx     Social History Social History   Tobacco Use  . Smoking status: Current Some Day Smoker    Packs/day: 0.10    Years: 7.00    Pack years: 0.70    Types: Cigarettes  . Smokeless tobacco: Never Used  . Tobacco comment: smokes "once or twice a week"  Substance Use Topics  . Alcohol use: Yes    Comment: rare  .  Drug use: No     Review of Systems  Constitutional: No fever/chills Eyes: No visual changes. No discharge ENT: No upper respiratory complaints. Cardiovascular: no chest pain. Respiratory: Patient has cough. Gastrointestinal: No abdominal pain.  No nausea, no vomiting.  No diarrhea.  No constipation. Genitourinary: Negative for dysuria. No hematuria Musculoskeletal: Negative for musculoskeletal pain. Skin: Negative for rash, abrasions, lacerations, ecchymosis. Neurological: Negative for headaches, focal weakness or numbness.   ____________________________________________   PHYSICAL EXAM:  VITAL  SIGNS: ED Triage Vitals  Enc Vitals Group     BP 07/01/19 2023 (!) 146/93     Pulse Rate 07/01/19 2023 (!) 110     Resp 07/01/19 2023 18     Temp 07/01/19 2023 98.7 F (37.1 C)     Temp Source 07/01/19 2023 Oral     SpO2 07/01/19 2023 99 %     Weight 07/01/19 2020 184 lb (83.5 kg)     Height 07/01/19 2020 5\' 5"  (1.651 m)     Head Circumference --      Peak Flow --      Pain Score 07/01/19 2020 8     Pain Loc --      Pain Edu? --      Excl. in [place]? --      Constitutional: Alert and oriented. Well appearing and in no acute distress. Eyes: Conjunctivae are normal. PERRL. EOMI. Head: Atraumatic. ENT:      Ears: TMs are injected bilaterally.      Nose: No congestion/rhinnorhea.      Mouth/Throat: Mucous membranes are moist.  Neck: No stridor.  No cervical spine tenderness to palpation. Cardiovascular: Normal rate, regular rhythm. Normal S1 and S2.  Good peripheral circulation. Respiratory: Normal respiratory effort without tachypnea or retractions. Lungs CTAB. Good air entry to the bases with no decreased or absent breath sounds. Skin:  Skin is warm, dry and intact. No rash noted. Psychiatric: Mood and affect are normal. Speech and behavior are normal. Patient exhibits appropriate insight and judgement.   ____________________________________________   LABS (all labs ordered are listed, but only abnormal results are displayed)  Labs Reviewed - No data to display ____________________________________________  EKG   ____________________________________________  RADIOLOGY I personally viewed and evaluated these images as part of my medical decision making, as well as reviewing the written report by the radiologist.  Dg Chest 1 View  Result Date: 07/01/2019 CLINICAL DATA:  Cough EXAM: CHEST  1 VIEW COMPARISON:  07/07/2018 FINDINGS: The heart size and mediastinal contours are within normal limits. Both lungs are clear. The visualized skeletal structures are unremarkable.  IMPRESSION: No active disease. Electronically Signed   By: Donavan Foil M.D.   On: 07/01/2019 22:14    ____________________________________________    PROCEDURES  Procedure(s) performed:    Procedures    Medications  dexamethasone (DECADRON) 10 MG/ML injection for Pediatric ORAL use 10 mg (10 mg Oral Given 07/01/19 2215)  chlorpheniramine-HYDROcodone (TUSSIONEX) 10-8 MG/5ML suspension 5 mL (5 mLs Oral Given 07/01/19 2215)     ____________________________________________   INITIAL IMPRESSION / ASSESSMENT AND PLAN / ED COURSE  Pertinent labs & imaging results that were available during my care of the patient were reviewed by me and considered in my medical decision making (see chart for details).  Review of the [place] CSRS was performed in accordance of the [place] prior to dispensing any controlled drugs.           Assessment and plan Cough 38 year old female presents to the  emergency department with a persistent cough since this morning.  Patient was mildly tachycardic and hypertensive at triage.  Vital signs were otherwise reassuring.  Patient was given oral Decadron Intestinex to the emergency department and her cough significantly improved.  She was discharged with test next.  Patient was advised to stay quarantined in her home until COVID-19 results return.  She voiced understanding.  All patient questions were answered.     ____________________________________________  FINAL CLINICAL IMPRESSION(S) / ED DIAGNOSES  Final diagnoses:  Cough      NEW MEDICATIONS STARTED DURING THIS VISIT:  ED Discharge Orders         Ordered    chlorpheniramine-HYDROcodone ([place]) 10-8 MG/5ML SUER  Every 12 hours PRN     07/01/19 2242              This chart was dictated using voice recognition software/Dragon. Despite best efforts to proofread, errors can occur which can change the meaning. Any change was purely unintentional.    Lannie Fields, PA-C 07/01/19  2248    Arta Silence, MD 07/01/19 2257

## 2019-07-01 NOTE — ED Triage Notes (Signed)
Pt to triage via w/c with no distress noted, mask in place; pt reports nonprod cough, sore throat and fever since this am; had flu/COVID swab while at work today, results pending; but c/o persistent cough unrelieved by tessalon pearls with post tussive vomit

## 2019-07-15 ENCOUNTER — Emergency Department: Payer: Medicaid Other

## 2019-07-15 ENCOUNTER — Other Ambulatory Visit: Payer: Self-pay

## 2019-07-15 ENCOUNTER — Emergency Department
Admission: EM | Admit: 2019-07-15 | Discharge: 2019-07-15 | Disposition: A | Discharge status: Home / Self Care | Payer: Medicaid Other | Attending: Emergency Medicine | Admitting: Emergency Medicine

## 2019-07-15 DIAGNOSIS — J45909 Unspecified asthma, uncomplicated: Secondary | ICD-10-CM | POA: Diagnosis not present

## 2019-07-15 DIAGNOSIS — R0789 Other chest pain: Secondary | ICD-10-CM | POA: Insufficient documentation

## 2019-07-15 DIAGNOSIS — F17210 Nicotine dependence, cigarettes, uncomplicated: Secondary | ICD-10-CM | POA: Insufficient documentation

## 2019-07-15 DIAGNOSIS — R079 Chest pain, unspecified: Secondary | ICD-10-CM | POA: Diagnosis present

## 2019-07-15 HISTORY — DX: Unspecified asthma, uncomplicated: J45.909

## 2019-07-15 LAB — BASIC METABOLIC PANEL
Anion gap: 12 (ref 5–15)
BUN: 10 mg/dL (ref 6–20)
CO2: 22 mmol/L (ref 22–32)
Calcium: 8.8 mg/dL — ABNORMAL LOW (ref 8.9–10.3)
Chloride: 107 mmol/L (ref 98–111)
Creatinine, Ser: 0.69 mg/dL (ref 0.44–1.00)
GFR calc Af Amer: >60 mL/min (ref >60)
GFR calc non Af Amer: >60 mL/min (ref >60)
Glucose, Bld: 108 mg/dL — ABNORMAL HIGH (ref 70–99)
Potassium: 3.3 mmol/L — ABNORMAL LOW (ref 3.5–5.1)
Sodium: 141 mmol/L (ref 135–145)

## 2019-07-15 LAB — CBC
HCT: 39.4 % (ref 36.0–46.0)
Hemoglobin: 13.8 g/dL (ref 12.0–15.0)
MCH: 33.9 pg (ref 26.0–34.0)
MCHC: 35 g/dL (ref 30.0–36.0)
MCV: 96.8 fL (ref 80.0–100.0)
Platelets: 261 10*3/uL (ref 150–400)
RBC: 4.07 MIL/uL (ref 3.87–5.11)
RDW: 15.1 % (ref 11.5–15.5)
WBC: 8 10*3/uL (ref 4.0–10.5)
nRBC: 0 % (ref 0.0–0.2)

## 2019-07-15 LAB — POCT PREGNANCY, URINE: Preg Test, Ur: NEGATIVE

## 2019-07-15 LAB — TROPONIN I (HIGH SENSITIVITY)
Troponin I (High Sensitivity): 4 ng/L (ref <18)
Troponin I (High Sensitivity): 3 ng/L (ref <18)

## 2019-07-15 MED ORDER — IOHEXOL 350 MG/ML SOLN
75.0000 mL | Freq: Once | INTRAVENOUS | Status: AC | PRN
  Administered 2019-07-15: 15:00:00 75 mL via INTRAVENOUS

## 2019-07-15 MED ORDER — SODIUM CHLORIDE 0.9% FLUSH
3.0000 mL | Freq: Once | INTRAVENOUS | Status: AC
  Administered 2019-07-15: 16:00:00 3 mL via INTRAVENOUS

## 2019-07-15 NOTE — ED Triage Notes (Addendum)
Pt comes via [facility] from work with c/o chest pain and SOB. Pt states daughter recently dx with Flu. Pt has had some SOB on and off for about week.  Pt states no fever or chills. Pt states some dizziness. EMS reports VSS, ST on EKG, BS-116.  Pt alert. Pt states mid sternal chest pain that is heavy. Pt states it hurts to catch her breath. Pt states some nausea.

## 2019-07-15 NOTE — ED Provider Notes (Signed)
[facility] Emergency Department Provider Note   ____________________________________________    I have reviewed the triage vital signs and the nursing notes.   HISTORY  Chief Complaint Shortness of Breath and Chest Pain     HPI Katherine Murray is a 38 y.o. female who presents with complaints of chest pain and mild shortness of breath.  Patient reports she has been feeling little bit short of breath over the last week.  She thought it might be related to asthma versus smoking.  She does report that her daughter is currently been diagnosed with influenza, coronavirus test is pending.  Patient denies calf pain or swelling.  No history of blood clots or PE.  Patient reports he was at work today taking a break and felt chest pressure and short of breath.  She does describe some pleurisy as well.  Currently she is feeling significantly better.   Past Medical History:  Diagnosis Date  . A-fib ([place])   . Asthma   . Cancer ([place])    skin  . Diverticulosis   . Gallstones   . Headache   . Heart rate fast   . Kidney stones   . Lower abdominal pain    Right  . Mitral regurgitation    Mild  . Ovarian cyst 09/16/2016   right measuring 4.4 cm  . Pneumonia   . Tricuspid regurgitation    Trace  . Vaginal infection     Patient Active Problem List   Diagnosis Date Noted  . Back pain 05/16/2017  . GAD (generalized anxiety disorder) 02/13/2017  . Preventative health care 10/04/2016  . Hyperlipidemia 10/04/2016  . Pelvic adhesive disease 09/24/2016  . Endometriosis determined by laparoscopy 09/24/2016  . Skin abnormality 08/01/2016  . Tachycardia 08/01/2016  . Encounter for Depo-Provera contraception 08/01/2016  . Migraines 08/01/2016    Past Surgical History:  Procedure Laterality Date  . CESAREAN SECTION    . CHOLECYSTECTOMY    . GALLBLADDER SURGERY    . LAPAROSCOPIC OVARIAN CYSTECTOMY Right 09/24/2016   Procedure: LAPAROSCOPIC RIGHT OVARIAN  CYSTECTOMY, Excision of ovarian cyst, Excision of endometeriosis,;  Surgeon: Brayton Mars, MD;  Location: [facility] ORS;  Service: Gynecology;  Laterality: Right;  . TONSILLECTOMY      Prior to Admission medications   Medication Sig Start Date End Date Taking? Authorizing Provider  HYDROcodone-homatropine (HYCODAN) 5-1.5 MG/5ML syrup Take 5 mLs by mouth every 6 (six) hours as needed. 12/10/18   Fisher, Linden Dolin, PA-C  oseltamivir (TAMIFLU) 75 MG capsule Take 1 capsule (75 mg total) by mouth 2 (two) times daily. 12/10/18   Versie Starks, PA-C     Allergies Lyrica [pregabalin], Eggs or egg-derived products, and Latex  Family History  Problem Relation Age of Onset  . Colon cancer Paternal Grandmother   . Diabetes Paternal Grandmother   . Ulcerative colitis Other   . Kidney disease Other   . Stomach cancer Maternal Grandfather   . Diabetes Mother   . Diabetes Father   . Ovarian cancer Maternal Grandmother   . Breast cancer Neg Hx     Social History Social History   Tobacco Use  . Smoking status: Current Some Day Smoker    Packs/day: 0.10    Years: 7.00    Pack years: 0.70    Types: Cigarettes  . Smokeless tobacco: Never Used  . Tobacco comment: smokes "once or twice a week"  Substance Use Topics  . Alcohol use: Yes  Comment: rare  . Drug use: No    Review of Systems  Constitutional: No fever/chills Eyes: No visual changes.  ENT: No sore throat. Cardiovascular: As above Respiratory: As above Gastrointestinal: No abdominal pain.  No nausea, no vomiting.   Genitourinary: Negative for dysuria. Musculoskeletal: Negative for back pain. Skin: Negative for rash. Neurological: Negative for headaches   ____________________________________________   PHYSICAL EXAM:  VITAL SIGNS: ED Triage Vitals  Enc Vitals Group     BP 07/15/19 1416 123/69     Pulse Rate 07/15/19 1416 (!) 121     Resp 07/15/19 1416 19     Temp 07/15/19 1416 98.1 F (36.7 C)     Temp src --       SpO2 07/15/19 1416 100 %     Weight 07/15/19 1411 81.6 kg (180 lb)     Height 07/15/19 1411 1.651 m (5\' 5" )     Head Circumference --      Peak Flow --      Pain Score 07/15/19 1411 9     Pain Loc --      Pain Edu? --      Excl. in [place]? --     Constitutional: Alert and oriented.  Eyes: Conjunctivae are normal.   Nose: No congestion/rhinnorhea. Mouth/Throat: Mucous membranes are moist.    Cardiovascular: Mild tachycardia, regular rhythm. Grossly normal heart sounds.  Good peripheral circulation. Respiratory: Normal respiratory effort.  No retractions. Lungs CTAB.  No wheezing Gastrointestinal: Soft and nontender. No distention.    Musculoskeletal: Warm and well perfused.  No calf pain or swelling Neurologic:  Normal speech and language. No gross focal neurologic deficits are appreciated.  Skin:  Skin is warm, dry and intact. No rash noted. Psychiatric: Mood and affect are normal. Speech and behavior are normal.  ____________________________________________   LABS (all labs ordered are listed, but only abnormal results are displayed)  Labs Reviewed  BASIC METABOLIC PANEL - Abnormal; Notable for the following components:      Result Value   Potassium 3.3 (*)    Glucose, Bld 108 (*)    Calcium 8.8 (*)    All other components within normal limits  CBC  POC URINE PREG, ED  POCT PREGNANCY, URINE  TROPONIN I (HIGH SENSITIVITY)  TROPONIN I (HIGH SENSITIVITY)   ____________________________________________  EKG  ED ECG REPORT I, Lavonia Drafts, the attending physician, personally viewed and interpreted this ECG.  Date: 07/15/2019  Rhythm: Tachycardia QRS Axis: normal Intervals: normal ST/T Wave abnormalities: normal Narrative Interpretation: no evidence of acute ischemia  ____________________________________________  RADIOLOGY  Chest x-ray unremarkable ____________________________________________   PROCEDURES  Procedure(s) performed: No  Procedures    Critical Care performed: No ____________________________________________   INITIAL IMPRESSION / ASSESSMENT AND PLAN / ED COURSE  Pertinent labs & imaging results that were available during my care of the patient were reviewed by me and considered in my medical decision making (see chart for details).  Patient presents with complaints of chest pain shortness of breath.  EKG shows tachycardia, troponin reassuring.  Lab work otherwise unremarkable.  Chest x-ray is benign.  No wheezing on exam.  Does smoke.  Differential includes bronchospasm versus PE, ACS appears unlikely but will repeat troponin, CT angiography pending  CT angiography negative for PE.  Second troponin is normal.  On reexam patient is pain-free.  Appropriate for discharge at this time with outpatient follow-up, return precautions discussed    ____________________________________________   FINAL CLINICAL IMPRESSION(S) / ED DIAGNOSES  Final  diagnoses:  Atypical chest pain        Note:  This document was prepared using Dragon voice recognition software and may include unintentional dictation errors.   Lavonia Drafts, MD 07/15/19 [phone]

## 2019-07-15 NOTE — ED Notes (Signed)
Repeat trop drawn and sent. Patient reports continues to have cp/sob, vss on monitor. Awaiting ct angio r/o pe

## 2019-08-31 ENCOUNTER — Other Ambulatory Visit: Payer: Self-pay | Admitting: Physician Assistant

## 2019-08-31 ENCOUNTER — Other Ambulatory Visit: Payer: Self-pay

## 2019-08-31 ENCOUNTER — Ambulatory Visit
Admission: RE | Admit: 2019-08-31 | Discharge: 2019-08-31 | Disposition: A | Discharge status: Home / Self Care | Payer: Medicaid Other | Source: Ambulatory Visit | Attending: Physician Assistant | Admitting: Physician Assistant

## 2019-08-31 DIAGNOSIS — R600 Localized edema: Secondary | ICD-10-CM

## 2019-10-08 ENCOUNTER — Other Ambulatory Visit: Payer: Self-pay

## 2019-10-08 DIAGNOSIS — Z20822 Contact with and (suspected) exposure to covid-19: Secondary | ICD-10-CM

## 2019-10-10 LAB — NOVEL CORONAVIRUS, NAA: SARS-CoV-2, NAA: NOT DETECTED

## 2019-11-04 ENCOUNTER — Other Ambulatory Visit: Payer: Self-pay

## 2019-11-04 ENCOUNTER — Ambulatory Visit: Payer: Medicaid Other | Attending: Internal Medicine

## 2019-11-04 DIAGNOSIS — Z20822 Contact with and (suspected) exposure to covid-19: Secondary | ICD-10-CM

## 2019-11-06 LAB — NOVEL CORONAVIRUS, NAA: SARS-CoV-2, NAA: NOT DETECTED

## 2019-12-08 ENCOUNTER — Other Ambulatory Visit: Payer: Self-pay | Admitting: Orthopedic Surgery

## 2019-12-08 DIAGNOSIS — S46012A Strain of muscle(s) and tendon(s) of the rotator cuff of left shoulder, initial encounter: Secondary | ICD-10-CM

## 2019-12-18 ENCOUNTER — Ambulatory Visit: Payer: Medicaid Other

## 2019-12-21 ENCOUNTER — Ambulatory Visit
Admission: RE | Admit: 2019-12-21 | Discharge: 2019-12-21 | Disposition: A | Discharge status: Home / Self Care | Payer: Medicaid Other | Source: Ambulatory Visit | Attending: Orthopedic Surgery | Admitting: Orthopedic Surgery

## 2019-12-21 ENCOUNTER — Other Ambulatory Visit: Payer: Self-pay

## 2019-12-21 DIAGNOSIS — X58XXXA Exposure to other specified factors, initial encounter: Secondary | ICD-10-CM | POA: Diagnosis not present

## 2019-12-21 DIAGNOSIS — S46012A Strain of muscle(s) and tendon(s) of the rotator cuff of left shoulder, initial encounter: Secondary | ICD-10-CM

## 2019-12-21 MED ORDER — SODIUM CHLORIDE (PF) 0.9 % IJ SOLN
10.0000 mL | Freq: Once | INTRAMUSCULAR | Status: AC
  Administered 2019-12-21: 10 mL

## 2019-12-21 MED ORDER — GADOBUTROL 1 MMOL/ML IV SOLN
0.0100 mL | Freq: Once | INTRAVENOUS | Status: AC | PRN
  Administered 2019-12-21: 10:00:00 0.01 mL

## 2019-12-21 MED ORDER — IOHEXOL 300 MG/ML  SOLN
7.0000 mL | Freq: Once | INTRAMUSCULAR | Status: AC | PRN
  Administered 2019-12-21: 10:00:00 7 mL

## 2019-12-21 MED ORDER — LIDOCAINE HCL (PF) 1 % IJ SOLN
5.0000 mL | Freq: Once | INTRAMUSCULAR | Status: AC
  Administered 2019-12-21: 5 mL
  Filled 2019-12-21: qty 5

## 2020-07-19 ENCOUNTER — Other Ambulatory Visit: Payer: Self-pay | Admitting: Orthopedic Surgery

## 2020-07-19 DIAGNOSIS — M4310 Spondylolisthesis, site unspecified: Secondary | ICD-10-CM

## 2020-08-05 ENCOUNTER — Telehealth: Payer: Self-pay | Admitting: Orthopedic Surgery

## 2020-08-09 ENCOUNTER — Ambulatory Visit: Payer: Medicaid Other

## 2021-08-09 ENCOUNTER — Other Ambulatory Visit: Payer: Self-pay

## 2021-08-09 ENCOUNTER — Ambulatory Visit (INDEPENDENT_AMBULATORY_CARE_PROVIDER_SITE_OTHER): Payer: Medicaid Other | Admitting: Obstetrics and Gynecology

## 2021-08-09 ENCOUNTER — Other Ambulatory Visit (HOSPITAL_COMMUNITY)
Admission: RE | Admit: 2021-08-09 | Discharge: 2021-08-09 | Disposition: A | Discharge status: Home / Self Care | Payer: Medicaid Other | Source: Ambulatory Visit | Attending: Obstetrics and Gynecology | Admitting: Obstetrics and Gynecology

## 2021-08-09 ENCOUNTER — Encounter: Payer: Self-pay | Admitting: Obstetrics and Gynecology

## 2021-08-09 VITALS — BP 123/83 | HR 103 | Ht 65.0 in | Wt 221.3 lb

## 2021-08-09 DIAGNOSIS — Z1231 Encounter for screening mammogram for malignant neoplasm of breast: Secondary | ICD-10-CM | POA: Diagnosis not present

## 2021-08-09 DIAGNOSIS — Z01419 Encounter for gynecological examination (general) (routine) without abnormal findings: Secondary | ICD-10-CM | POA: Insufficient documentation

## 2021-08-09 DIAGNOSIS — Z124 Encounter for screening for malignant neoplasm of cervix: Secondary | ICD-10-CM | POA: Insufficient documentation

## 2021-08-09 DIAGNOSIS — Z3009 Encounter for other general counseling and advice on contraception: Secondary | ICD-10-CM | POA: Diagnosis not present

## 2021-08-09 DIAGNOSIS — A599 Trichomoniasis, unspecified: Secondary | ICD-10-CM

## 2021-08-09 NOTE — Progress Notes (Signed)
Pt present for annual exam and est care with a new provider. Pt's last visit was in 2018. Pt due for pap and mammogram orders placed for both. Pt denies any issues at this time, but would like to discuss birth control Depo injection. Pt's LMP 07/29/2021. Pt had Covid vaccines but unsure of dates. Pt declined flu vaccine due to allergies.

## 2021-08-09 NOTE — Patient Instructions (Signed)
Preventive Care 40-41 Years Old, Female Preventive care refers to lifestyle choices and visits with your health care provider that can promote health and wellness. This includes: A yearly physical exam. This is also called an annual wellness visit. Regular dental and eye exams. Immunizations. Screening for certain conditions. Healthy lifestyle choices, such as: Eating a healthy diet. Getting regular exercise. Not using drugs or products that contain nicotine and tobacco. Limiting alcohol use. What can I expect for my preventive care visit? Physical exam Your health care provider will check your: Height and weight. These may be used to calculate your BMI (body mass index). BMI is a measurement that tells if you are at a healthy weight. Heart rate and blood pressure. Body temperature. Skin for abnormal spots. Counseling Your health care provider may ask you questions about your: Past medical problems. Family's medical history. Alcohol, tobacco, and drug use. Emotional well-being. Home life and relationship well-being. Sexual activity. Diet, exercise, and sleep habits. Work and work environment. Access to firearms. Method of birth control. Menstrual cycle. Pregnancy history. What immunizations do I need? Vaccines are usually given at various ages, according to a schedule. Your health care provider will recommend vaccines for you based on your age, medical history, and lifestyle or other factors, such as travel or where you work. What tests do I need? Blood tests Lipid and cholesterol levels. These may be checked every 5 years, or more often if you are over 50 years old. Hepatitis C test. Hepatitis B test. Screening Lung cancer screening. You may have this screening every year starting at age 55 if you have a 30-pack-year history of smoking and currently smoke or have quit within the past 15 years. Colorectal cancer screening. All adults should have this screening starting at  age 50 and continuing until age 75. Your health care provider may recommend screening at age 45 if you are at increased risk. You will have tests every 1-10 years, depending on your results and the type of screening test. Diabetes screening. This is done by checking your blood sugar (glucose) after you have not eaten for a while (fasting). You may have this done every 1-3 years. Mammogram. This may be done every 1-2 years. Talk with your health care provider about when you should start having regular mammograms. This may depend on whether you have a family history of breast cancer. BRCA-related cancer screening. This may be done if you have a family history of breast, ovarian, tubal, or peritoneal cancers. Pelvic exam and Pap test. This may be done every 3 years starting at age 21. Starting at age 30, this may be done every 5 years if you have a Pap test in combination with an HPV test. Other tests STD (sexually transmitted disease) testing, if you are at risk. Bone density scan. This is done to screen for osteoporosis. You may have this scan if you are at high risk for osteoporosis. Talk with your health care provider about your test results, treatment options, and if necessary, the need for more tests. Follow these instructions at home: Eating and drinking  Eat a diet that includes fresh fruits and vegetables, whole grains, lean protein, and low-fat dairy products. Take vitamin and mineral supplements as recommended by your health care provider. Do not drink alcohol if: Your health care provider tells you not to drink. You are pregnant, may be pregnant, or are planning to become pregnant. If you drink alcohol: Limit how much you have to 0-1 drink a day. Be   aware of how much alcohol is in your drink. In the [place] one drink equals one 12 oz bottle of beer (355 mL), one 5 oz glass of wine (148 mL), or one 1 oz glass of hard liquor (44 mL). Lifestyle Take daily care of your teeth and  gums. Brush your teeth every morning and night with fluoride toothpaste. Floss one time each day. Stay active. Exercise for at least 30 minutes 5 or more days each week. Do not use any products that contain nicotine or tobacco, such as cigarettes, e-cigarettes, and chewing tobacco. If you need help quitting, ask your health care provider. Do not use drugs. If you are sexually active, practice safe sex. Use a condom or other form of protection to prevent STIs (sexually transmitted infections). If you do not wish to become pregnant, use a form of birth control. If you plan to become pregnant, see your health care provider for a prepregnancy visit. If told by your health care provider, take low-dose aspirin daily starting at age 26. Find healthy ways to cope with stress, such as: Meditation, yoga, or listening to music. Journaling. Talking to a trusted person. Spending time with friends and family. Safety Always wear your seat belt while driving or riding in a vehicle. Do not drive: If you have been drinking alcohol. Do not ride with someone who has been drinking. When you are tired or distracted. While texting. Wear a helmet and other protective equipment during sports activities. If you have firearms in your house, make sure you follow all gun safety procedures. What's next? Visit your health care provider once a year for an annual wellness visit. Ask your health care provider how often you should have your eyes and teeth checked. Stay up to date on all vaccines. This information is not intended to replace advice given to you by your health care provider. Make sure you discuss any questions you have with your health care provider. Document Revised: 01/13/2021 Document Reviewed: 07/17/2018 Elsevier Patient Education  2022 [place] Breast self-awareness is knowing how your breasts look and feel. Doing breast self-awareness is important. It allows you to  catch a breast problem early while it is still small and can be treated. All women should do breast self-awareness, including women who have had breast implants. Tell your doctor if you notice a change in your breasts. What you need: A mirror. A well-lit room. How to do a breast self-exam A breast self-exam is one way to learn what is normal for your breasts and to check for changes. To do a breast self-exam: Look for changes  Take off all the clothes above your waist. Stand in front of a mirror in a room with good lighting. Put your hands on your hips. Push your hands down. Look at your breasts and nipples in the mirror to see if one breast or nipple looks different from the other. Check to see if: The shape of one breast is different. The size of one breast is different. There are wrinkles, dips, and bumps in one breast and not the other. Look at each breast for changes in the skin, such as: Redness. Scaly areas. Look for changes in your nipples, such as: Liquid around the nipples. Bleeding. Dimpling. Redness. A change in where the nipples are. Feel for changes  Lie on your back on the floor. Feel each breast. To do this, follow these steps: Pick a breast to feel. Put the arm closest to  that breast above your head. Use your other arm to feel the nipple area of your breast. Feel the area with the pads of your three middle fingers by making small circles with your fingers. For the first circle, press lightly. For the second circle, press harder. For the third circle, press even harder. Keep making circles with your fingers at the different pressures as you move down your breast. Stop when you feel your ribs. Move your fingers a little toward the center of your body. Start making circles with your fingers again, this time going up until you reach your collarbone. Keep making up-and-down circles until you reach your armpit. Remember to keep using the three pressures. Feel the other  breast in the same way. Sit or stand in the tub or shower. With soapy water on your skin, feel each breast the same way you did in step 2 when you were lying on the floor. Write down what you find Writing down what you find can help you remember what to tell your doctor. Write down: What is normal for each breast. Any changes you find in each breast, including: The kind of changes you find. Whether you have pain. Size and location of any lumps. When you last had your menstrual period. General tips Check your breasts every month. If you are breastfeeding, the best time to check your breasts is after you feed your baby or after you use a breast pump. If you get menstrual periods, the best time to check your breasts is 5-7 days after your menstrual period is over. With time, you will become comfortable with the self-exam, and you will begin to know if there are changes in your breasts. Contact a doctor if you: See a change in the shape or size of your breasts or nipples. See a change in the skin of your breast or nipples, such as red or scaly skin. Have fluid coming from your nipples that is not normal. Find a lump or thick area that was not there before. Have pain in your breasts. Have any concerns about your breast health. Summary Breast self-awareness includes looking for changes in your breasts, as well as feeling for changes within your breasts. Breast self-awareness should be done in front of a mirror in a well-lit room. You should check your breasts every month. If you get menstrual periods, the best time to check your breasts is 5-7 days after your menstrual period is over. Let your doctor know of any changes you see in your breasts, including changes in size, changes on the skin, pain or tenderness, or fluid from your nipples that is not normal. This information is not intended to replace advice given to you by your health care provider. Make sure you discuss any questions you have  with your health care provider. Document Revised: 06/24/2018 Document Reviewed: 06/24/2018 Elsevier Patient Education  [place].

## 2021-08-09 NOTE — Progress Notes (Signed)
HPI:      [name] is a 39 y.o. G2P1001 who LMP was Patient's last menstrual period was 07/29/2021.  Subjective:   She presents today for her annual examination.  She is having normal regular menstrual periods.  She says she is no longer having pain from her endometriosis.  She is using female condoms for birth control but would like to discuss other methods.  She is overdue for her Pap smear.     Hx: The following portions of the patient's history were reviewed and updated as appropriate:             She  has a past medical history of A-fib ([place]), Asthma, Cancer ([place]), Diverticulosis, Gallstones, Headache, Heart rate fast, Kidney stones, Lower abdominal pain, Mitral regurgitation, Ovarian cyst (09/16/2016), Pneumonia, Tricuspid regurgitation, and Vaginal infection. She does not have any pertinent problems on file. She  has a past surgical history that includes Cholecystectomy; Cesarean section; Tonsillectomy; Gallbladder surgery; and Laparoscopic ovarian cystectomy (Right, 09/24/2016). Her family history includes Colon cancer in her paternal grandmother; Diabetes in her father, mother, and paternal grandmother; Kidney disease in an other family member; Ovarian cancer in her maternal grandmother; Stomach cancer in her maternal grandfather; Ulcerative colitis in an other family member. She  reports that she has been smoking cigarettes. She has a 0.70 pack-year smoking history. She has never used smokeless tobacco. She reports that she does not currently use alcohol. She reports that she does not use drugs. She has a current medication list which includes the following prescription(s): acetaminophen. She is allergic to lyrica [pregabalin], eggs or egg-derived products, and latex.       Review of Systems:  Review of Systems  Constitutional: Denied constitutional symptoms, night sweats, recent illness, fatigue, fever, insomnia and weight loss.  Eyes: Denied eye symptoms, eye pain,  photophobia, vision change and visual disturbance.  Ears/Nose/Throat/Neck: Denied ear, nose, throat or neck symptoms, hearing loss, nasal discharge, sinus congestion and sore throat.  Cardiovascular: Denied cardiovascular symptoms, arrhythmia, chest pain/pressure, edema, exercise intolerance, orthopnea and palpitations.  Respiratory: Denied pulmonary symptoms, asthma, pleuritic pain, productive sputum, cough, dyspnea and wheezing.  Gastrointestinal: Denied, gastro-esophageal reflux, melena, nausea and vomiting.  Genitourinary: Denied genitourinary symptoms including symptomatic vaginal discharge, pelvic relaxation issues, and urinary complaints.  Musculoskeletal: Denied musculoskeletal symptoms, stiffness, swelling, muscle weakness and myalgia.  Dermatologic: Denied dermatology symptoms, rash and scar.  Neurologic: Denied neurology symptoms, dizziness, headache, neck pain and syncope.  Psychiatric: Denied psychiatric symptoms, anxiety and depression.  Endocrine: Denied endocrine symptoms including hot flashes and night sweats.   Meds:   Current Outpatient Medications on File Prior to Visit  Medication Sig Dispense Refill   acetaminophen (TYLENOL) 500 MG tablet Take 500 mg by mouth every 6 (six) hours as needed. As needed     No current facility-administered medications on file prior to visit.     [facility] - 08/09/21 1400       Contraception Wrap Up   Current Method Female Condom    End Method IUD or IUS    Contraception Counseling Provided No            The pregnancy intention screening data noted above was reviewed. Potential methods of contraception were discussed. The patient elected to proceed with IUD or IUS.    Objective:     Vitals:   08/09/21 1332  BP: 123/83  Pulse: (!) 103    Filed Weights   08/09/21 1332  Weight: 221 lb 4.8  oz (100.4 kg)              Physical examination General NAD, Conversant  HEENT Atraumatic; Op clear with mmm.  Normo-cephalic. Pupils  reactive. Anicteric sclerae  Thyroid/Neck Smooth without nodularity or enlargement. Normal ROM.  Neck Supple.  Skin No rashes, lesions or ulceration. Normal palpated skin turgor. No nodularity.  Breasts: No masses or discharge.  Symmetric.  No axillary adenopathy.  Lungs: Clear to auscultation.No rales or wheezes. Normal Respiratory effort, no retractions.  Heart: NSR.  No murmurs or rubs appreciated. No periferal edema  Abdomen: Soft.  Non-tender.  No masses.  No HSM. No hernia  Extremities: Moves all appropriately.  Normal ROM for age. No lymphadenopathy.  Neuro: Oriented to PPT.  Normal mood. Normal affect.     Pelvic:   Vulva: Normal appearance.  No lesions.  Vagina: No lesions or abnormalities noted.  Support: Normal pelvic support.  Urethra No masses tenderness or scarring.  Meatus Normal size without lesions or prolapse.  Cervix: Normal appearance.  No lesions.  Anus: Normal exam.  No lesions.  Perineum: Normal exam.  No lesions.        Bimanual   Uterus: Normal size.  Non-tender.  Mobile.  AV.  Adnexae: No masses.  Non-tender to palpation.  Cul-de-sac: Negative for abnormality.     Assessment:    G2P1001 Patient Active Problem List   Diagnosis Date Noted   Back pain 05/16/2017   GAD (generalized anxiety disorder) 02/13/2017   Preventative health care 10/04/2016   Hyperlipidemia 10/04/2016   Pelvic adhesive disease 09/24/2016   Endometriosis determined by laparoscopy 09/24/2016   Skin abnormality 08/01/2016   Tachycardia 08/01/2016   Encounter for Depo-Provera contraception 08/01/2016   Migraines 08/01/2016     1. Encounter for well woman exam with routine gynecological exam   2. Pap smear for cervical cancer screening   3. Breast cancer screening by mammogram   4. Birth control counseling        Plan:            1.  Basic Screening Recommendations The basic screening recommendations for asymptomatic women were discussed with the patient during her visit.   The age-appropriate recommendations were discussed with her and the rational for the tests reviewed.  When I am informed by the patient that another primary care physician has previously obtained the age-appropriate tests and they are up-to-date, only outstanding tests are ordered and referrals given as necessary.  Abnormal results of tests will be discussed with her when all of her results are completed.  Routine preventative health maintenance measures emphasized: Exercise/Diet/Weight control, Tobacco Warnings, Alcohol/Substance use risks and Stress Management Pap smear performed-CO test-mammogram ordered Patient will return for blood work Orders Orders Placed This Encounter  Procedures   MM 3D SCREEN BREAST BILATERAL   CBC   Hemoglobin A1c   Lipid panel   TSH   Basic metabolic panel    No orders of the defined types were placed in this encounter.    2.  Birth Control I discussed multiple birth control options and methods with the patient.  The risks and benefits of each were reviewed. IUD Literature on IUD made available.  Risks and benefits discussed.  She is considering IUD as an option for birth/cycle control.      F/U  Return for She is to call at the start of next menses.  Finis Bud, M.D. 08/09/2021 2:05 PM

## 2021-08-14 LAB — CYTOLOGY - PAP
Comment: NEGATIVE
Diagnosis: NEGATIVE
High risk HPV: NEGATIVE

## 2021-08-15 ENCOUNTER — Emergency Department: Payer: Medicaid Other

## 2021-08-15 ENCOUNTER — Other Ambulatory Visit: Payer: Self-pay

## 2021-08-15 ENCOUNTER — Emergency Department
Admission: EM | Admit: 2021-08-15 | Discharge: 2021-08-15 | Disposition: A | Discharge status: Home / Self Care | Payer: Medicaid Other | Attending: Emergency Medicine | Admitting: Emergency Medicine

## 2021-08-15 ENCOUNTER — Encounter: Payer: Self-pay | Admitting: Emergency Medicine

## 2021-08-15 DIAGNOSIS — I4891 Unspecified atrial fibrillation: Secondary | ICD-10-CM | POA: Diagnosis not present

## 2021-08-15 DIAGNOSIS — J45909 Unspecified asthma, uncomplicated: Secondary | ICD-10-CM | POA: Diagnosis not present

## 2021-08-15 DIAGNOSIS — Z91040 Latex allergy status: Secondary | ICD-10-CM | POA: Insufficient documentation

## 2021-08-15 DIAGNOSIS — W01198A Fall on same level from slipping, tripping and stumbling with subsequent striking against other object, initial encounter: Secondary | ICD-10-CM | POA: Insufficient documentation

## 2021-08-15 DIAGNOSIS — S93401A Sprain of unspecified ligament of right ankle, initial encounter: Secondary | ICD-10-CM | POA: Insufficient documentation

## 2021-08-15 DIAGNOSIS — S99911A Unspecified injury of right ankle, initial encounter: Secondary | ICD-10-CM | POA: Diagnosis present

## 2021-08-15 DIAGNOSIS — F17210 Nicotine dependence, cigarettes, uncomplicated: Secondary | ICD-10-CM | POA: Insufficient documentation

## 2021-08-15 DIAGNOSIS — Z85820 Personal history of malignant melanoma of skin: Secondary | ICD-10-CM | POA: Insufficient documentation

## 2021-08-15 MED ORDER — OXYCODONE-ACETAMINOPHEN 5-325 MG PO TABS
1.0000 | ORAL_TABLET | Freq: Once | ORAL | Status: AC
  Administered 2021-08-15: 1 via ORAL
  Filled 2021-08-15: qty 1

## 2021-08-15 NOTE — Discharge Instructions (Addendum)
You likely have a ankle sprain.  There were no broken bones on your x-ray.  Please follow-up with the orthopedic physicians if you continue to have significant discomfort.  Please continue to take Motrin around-the-clock.  You can use the crutches for pain.  Try to rest and ice and elevate the leg.

## 2021-08-15 NOTE — ED Triage Notes (Signed)
Pt to ED via POV, pt states that on Friday she tripped over a dog toy and hit her toes on the bed frame and rolled her ankle. Pt was seen at [facility] on Saturday and had X-rays done. Pt was told that she had a severe sprain. Pt was given boot and crutches. Pt states that pain and swelling has increased and she is having redness on her toes. Pt reports that she is unable to put pressure on her foot now and she cannot move her foot at all now. Pt is in NAD at this time.

## 2021-08-15 NOTE — ED Provider Notes (Signed)
[facility]  ____________________________________________   Event Date/Time   First MD Initiated Contact with Patient 08/15/21 1030     (approximate)  I have reviewed the triage vital signs and the nursing notes.   HISTORY  Chief Complaint Foot Pain    HPI Katherine Murray is a 40 y.o. female past medical history of atrial fibrillation, asthma, mitral rotation who presents with right foot and ankle pain.  About 4 days ago patient tripped over a child's toy and rolled the right ankle.  She went to [facility] and was told she had an ankle sprain.  She is been using crutches and taking tramadol and NSAIDs.  She has had ongoing pain and today pain was worse so she presented to the ED.  She denies numbness weakness.  No new injury.         Past Medical History:  Diagnosis Date   A-fib ([place])    Asthma    Cancer ([place])    skin   Diverticulosis    Gallstones    Headache    Heart rate fast    Kidney stones    Lower abdominal pain    Right   Mitral regurgitation    Mild   Ovarian cyst 09/16/2016   right measuring 4.4 cm   Pneumonia    Tricuspid regurgitation    Trace   Vaginal infection     Patient Active Problem List   Diagnosis Date Noted   Back pain 05/16/2017   GAD (generalized anxiety disorder) 02/13/2017   Preventative health care 10/04/2016   Hyperlipidemia 10/04/2016   Pelvic adhesive disease 09/24/2016   Endometriosis determined by laparoscopy 09/24/2016   Skin abnormality 08/01/2016   Tachycardia 08/01/2016   Encounter for Depo-Provera contraception 08/01/2016   Migraines 08/01/2016    Past Surgical History:  Procedure Laterality Date   CESAREAN [place] Right 09/24/2016   Procedure: LAPAROSCOPIC RIGHT OVARIAN CYSTECTOMY, Excision of ovarian cyst, Excision of endometeriosis,;  Surgeon: Brayton Mars, MD;  Location: [facility] ORS;  Service:  Gynecology;  Laterality: Right;   TONSILLECTOMY      Prior to Admission medications   Medication Sig Start Date End Date Taking? Authorizing Provider  acetaminophen (TYLENOL) 500 MG tablet Take 500 mg by mouth every 6 (six) hours as needed. As needed    [provider]    Allergies Lyrica [pregabalin], Eggs or egg-derived products, and Latex  Family History  Problem Relation Age of Onset   Colon cancer Paternal Grandmother    Diabetes Paternal Grandmother    Ulcerative colitis Other    Kidney disease Other    Stomach cancer Maternal Grandfather    Diabetes Mother    Diabetes Father    Ovarian cancer Maternal Grandmother    Breast cancer Neg Hx     Social History Social History   Tobacco Use   Smoking status: Some Days    Packs/day: 0.10    Years: 7.00    Pack years: 0.70    Types: Cigarettes   Smokeless tobacco: Never   Tobacco comments:    smokes "once or twice a week"  Vaping Use   Vaping Use: Never used  Substance Use Topics   Alcohol use: Not Currently    Comment: rare   Drug use: No    Review of Systems   Review of Systems  Constitutional:  Negative for chills and  fever.  Respiratory:  Negative for shortness of breath.   Cardiovascular:  Negative for leg swelling.  Musculoskeletal:  Positive for arthralgias and myalgias.  All other systems reviewed and are negative.  Physical Exam Updated Vital Signs BP (!) 126/94 (BP Location: Left Arm)   Temp 99.1 F (37.3 C) (Oral)   Resp 16   Ht 5\' 5"  (1.651 m)   Wt 87.1 kg   LMP 07/29/2021   SpO2 98%   BMI 31.95 kg/m   Physical Exam Vitals and nursing note reviewed.  Constitutional:      General: She is in acute distress.     Appearance: Normal appearance.     Comments: She is tearful  HENT:     Head: Normocephalic and atraumatic.  Eyes:     General: No scleral icterus.    Conjunctiva/sclera: Conjunctivae normal.  Pulmonary:     Effort: Pulmonary effort is normal. No respiratory  distress.     Breath sounds: No stridor.  Musculoskeletal:        General: No deformity or signs of injury.     Cervical back: Normal range of motion.     Comments: Edema of the bilateral malleoli, tenderness to palpation over the bilateral malleoli, there is some erythema and ecchymosis over the right first MTP, 2+ DP pulse, able to range ankle and toes  Skin:    General: Skin is dry.     Coloration: Skin is not jaundiced or pale.  Neurological:     General: No focal deficit present.     Mental Status: She is alert and oriented to person, place, and time. Mental status is at baseline.  Psychiatric:        Mood and Affect: Mood normal.        Behavior: Behavior normal.     LABS (all labs ordered are listed, but only abnormal results are displayed)  Labs Reviewed - No data to display ____________________________________________  EKG  N/a ____________________________________________  RADIOLOGY Almeta Monas, personally viewed and evaluated these images (plain radiographs) as part of my medical decision making, as well as reviewing the written report by the radiologist.  ED MD interpretation: I reviewed the x-ray of the foot and ankle which not show any acute fracture dislocation or malalignment    ____________________________________________   PROCEDURES  Procedure(s) performed (including Critical Care):  Procedures   ____________________________________________   INITIAL IMPRESSION / ASSESSMENT AND PLAN / ED COURSE     40 year old with ongoing foot and ankle pain after rolling her ankle 4 days ago.  Seen at [facility] with negative x-rays now here for ongoing pain.  On exam she does have some swelling and tenderness over the medial and lateral malleolus as well as the right first MTP.  There is no ecchymosis or swelling on the dorsum of the foot.  She is otherwise neurovascular intact.  X-rays obtained again here today do not show any fracture.   Suspect that she has a significant sprain.  Placed in a leg strap and advised to continue taking NSAIDs along with the tramadol.  Will refer to orthopedics.  Otherwise she is stable for discharge.  Clinical Course as of 08/15/21 1418  Tue Aug 15, 2021  1121 IMPRESSION: Soft tissue swelling about the ankle without visible fracture or dislocation about the foot or ankle.   Mild hallux valgus.   [KM]    Clinical Course User Index [KM] Rada Hay, MD     ____________________________________________   FINAL  CLINICAL IMPRESSION(S) / ED DIAGNOSES  Final diagnoses:  Sprain of right ankle, unspecified ligament, initial encounter     ED Discharge Orders     None        Note:  This document was prepared using Dragon voice recognition software and may include unintentional dictation errors.    Rada Hay, MD 08/15/21 [phone]

## 2021-08-15 NOTE — ED Provider Notes (Signed)
Emergency Medicine Provider Triage Evaluation Note  Katherine Murray , a 40 y.o. female  was evaluated in triage.  Pt complains of right foot and ankle pain.  History of the same.  Had x-rays done on Saturday.  Pain has increased.  Review of Systems  Positive: Right foot and ankle pain Negative: No chest pain, shortness of breath, cough or congestion  Physical Exam  BP (!) 126/94 (BP Location: Left Arm)   Temp 99.1 F (37.3 C) (Oral)   Resp 16   Ht 5\' 5"  (1.651 m)   Wt 87.1 kg   LMP 07/29/2021   SpO2 98%   BMI 31.95 kg/m  Gen:   Awake, no distress   Resp:  Normal effort  MSK:   Moves extremities without difficulty, foot is in a boot Other:    Medical Decision Making  Medically screening exam initiated at 8:25 AM.  Appropriate orders placed.  Katherine Murray was informed that the remainder of the evaluation will be completed by another provider, this initial triage assessment does not replace that evaluation, and the importance of remaining in the ED until their evaluation is complete.     Versie Starks, PA-C 08/15/21 Percell Boston, MD 08/15/21 [phone]

## 2021-08-17 MED ORDER — METRONIDAZOLE 500 MG PO TABS: 2000.0000 mg | ORAL_TABLET | Freq: Once | ORAL | 0 refills | Status: AC

## 2021-08-17 NOTE — Addendum Note (Signed)
Addended by: Chilton Greathouse on: 08/17/2021 10:57 AM   Modules accepted: Orders

## 2021-08-29 ENCOUNTER — Ambulatory Visit (INDEPENDENT_AMBULATORY_CARE_PROVIDER_SITE_OTHER): Payer: Medicaid Other | Admitting: Obstetrics and Gynecology

## 2021-08-29 ENCOUNTER — Other Ambulatory Visit: Payer: Self-pay

## 2021-08-29 ENCOUNTER — Encounter: Payer: Self-pay | Admitting: Obstetrics and Gynecology

## 2021-08-29 VITALS — BP 111/82 | HR 99 | Resp 16 | Ht 65.0 in | Wt 223.1 lb

## 2021-08-29 DIAGNOSIS — Z30430 Encounter for insertion of intrauterine contraceptive device: Secondary | ICD-10-CM

## 2021-08-29 NOTE — Progress Notes (Signed)
HPI:      [name] is a 40 y.o. G2P1001 who LMP was Patient's last menstrual period was 08/29/2021.  Subjective:   She presents today for IUD insertion.  She has chosen IUD for birth control.  She also has a history of endometriosis and is hoping this may also help that.    Hx: The following portions of the patient's history were reviewed and updated as appropriate:             She  has a past medical history of A-fib ([place]), Asthma, Cancer ([place]), Diverticulosis, Gallstones, Headache, Heart rate fast, Kidney stones, Lower abdominal pain, Mitral regurgitation, Ovarian cyst (09/16/2016), Pneumonia, Tricuspid regurgitation, and Vaginal infection. She does not have any pertinent problems on file. She  has a past surgical history that includes Cholecystectomy; Cesarean section; Tonsillectomy; Gallbladder surgery; and Laparoscopic ovarian cystectomy (Right, 09/24/2016). Her family history includes Colon cancer in her paternal grandmother; Diabetes in her father, mother, and paternal grandmother; Kidney disease in an other family member; Ovarian cancer in her maternal grandmother; Stomach cancer in her maternal grandfather; Ulcerative colitis in an other family member. She  reports that she has been smoking cigarettes. She has a 0.70 pack-year smoking history. She has never used smokeless tobacco. She reports that she does not currently use alcohol. She reports that she does not use drugs. She has a current medication list which includes the following prescription(s): albuterol and levonorgestrel. She is allergic to lyrica [pregabalin], eggs or egg-derived products, and latex.       Review of Systems:  Review of Systems  Constitutional: Denied constitutional symptoms, night sweats, recent illness, fatigue, fever, insomnia and weight loss.  Eyes: Denied eye symptoms, eye pain, photophobia, vision change and visual disturbance.  Ears/Nose/Throat/Neck: Denied ear, nose, throat or neck symptoms,  hearing loss, nasal discharge, sinus congestion and sore throat.  Cardiovascular: Denied cardiovascular symptoms, arrhythmia, chest pain/pressure, edema, exercise intolerance, orthopnea and palpitations.  Respiratory: Denied pulmonary symptoms, asthma, pleuritic pain, productive sputum, cough, dyspnea and wheezing.  Gastrointestinal: Denied, gastro-esophageal reflux, melena, nausea and vomiting.  Genitourinary: Denied genitourinary symptoms including symptomatic vaginal discharge, pelvic relaxation issues, and urinary complaints.  Musculoskeletal: Denied musculoskeletal symptoms, stiffness, swelling, muscle weakness and myalgia.  Dermatologic: Denied dermatology symptoms, rash and scar.  Neurologic: Denied neurology symptoms, dizziness, headache, neck pain and syncope.  Psychiatric: Denied psychiatric symptoms, anxiety and depression.  Endocrine: Denied endocrine symptoms including hot flashes and night sweats.   Meds:   Current Outpatient Medications on File Prior to Visit  Medication Sig Dispense Refill   albuterol (VENTOLIN HFA) 108 (90 Base) MCG/ACT inhaler Inhale into the lungs.     levonorgestrel (MIRENA) 20 MCG/DAY IUD 1 each by Intrauterine route once.     No current facility-administered medications on file prior to visit.    Objective:     Vitals:   08/29/21 1601  BP: 111/82  Pulse: 99  Resp: 16    Physical examination   Pelvic:   Vulva: Normal appearance.  No lesions.  Vagina: No lesions or abnormalities noted.  Support: Normal pelvic support.  Urethra No masses tenderness or scarring.  Meatus Normal size without lesions or prolapse.  Cervix: Normal appearance.  No lesions.  Anus: Normal exam.  No lesions.  Perineum: Normal exam.  No lesions.        Bimanual   Uterus: Normal size.  Non-tender.  Mobile.  AV.  Adnexae: No masses.  Non-tender to palpation.  Cul-de-sac: Negative for abnormality.  IUD Procedure Pt has read the booklet and signed the appropriate  forms regarding the Mirena IUD.  All of her questions have been answered.   The cervix was cleansed with betadine solution.  After sounding the uterus and noting the position, the IUD was placed in the usual manner without problem.  The string was cut to the appropriate length.  The patient tolerated the procedure well.            [facility] # = [id number]   Assessment:    G2P1001 Patient Active Problem List   Diagnosis Date Noted   Back pain 05/16/2017   GAD (generalized anxiety disorder) 02/13/2017   Preventative health care 10/04/2016   Hyperlipidemia 10/04/2016   Pelvic adhesive disease 09/24/2016   Endometriosis determined by laparoscopy 09/24/2016   Skin abnormality 08/01/2016   Tachycardia 08/01/2016   Encounter for Depo-Provera contraception 08/01/2016   Migraines 08/01/2016     1. Encounter for insertion of mirena IUD       Plan:             F/U  Return in about 4 weeks (around 09/26/2021) for For IUD f/u.  Finis Bud, M.D. 08/29/2021 4:36 PM

## 2021-09-26 ENCOUNTER — Encounter: Payer: Medicaid Other | Admitting: Obstetrics and Gynecology

## 2021-09-27 ENCOUNTER — Encounter: Payer: Medicaid Other | Admitting: Obstetrics and Gynecology

## 2021-10-03 ENCOUNTER — Encounter: Payer: Medicaid Other | Admitting: Obstetrics and Gynecology

## 2021-10-03 ENCOUNTER — Encounter: Payer: Self-pay | Admitting: Obstetrics and Gynecology

## 2022-01-05 ENCOUNTER — Encounter: Payer: Self-pay | Admitting: Family

## 2022-01-05 ENCOUNTER — Ambulatory Visit (INDEPENDENT_AMBULATORY_CARE_PROVIDER_SITE_OTHER): Payer: 59 | Admitting: Family

## 2022-01-05 ENCOUNTER — Other Ambulatory Visit: Payer: Self-pay

## 2022-01-05 VITALS — BP 132/68 | HR 100 | Ht 65.0 in | Wt 225.0 lb

## 2022-01-05 DIAGNOSIS — E782 Mixed hyperlipidemia: Secondary | ICD-10-CM

## 2022-01-05 DIAGNOSIS — G43109 Migraine with aura, not intractable, without status migrainosus: Secondary | ICD-10-CM | POA: Diagnosis not present

## 2022-01-05 DIAGNOSIS — R635 Abnormal weight gain: Secondary | ICD-10-CM | POA: Diagnosis not present

## 2022-01-05 DIAGNOSIS — F411 Generalized anxiety disorder: Secondary | ICD-10-CM

## 2022-01-05 DIAGNOSIS — E876 Hypokalemia: Secondary | ICD-10-CM | POA: Diagnosis not present

## 2022-01-05 DIAGNOSIS — R5383 Other fatigue: Secondary | ICD-10-CM | POA: Diagnosis not present

## 2022-01-05 DIAGNOSIS — J4520 Mild intermittent asthma, uncomplicated: Secondary | ICD-10-CM

## 2022-01-05 DIAGNOSIS — N809 Endometriosis, unspecified: Secondary | ICD-10-CM | POA: Diagnosis not present

## 2022-01-05 DIAGNOSIS — Z833 Family history of diabetes mellitus: Secondary | ICD-10-CM | POA: Diagnosis not present

## 2022-01-05 DIAGNOSIS — R69 Illness, unspecified: Secondary | ICD-10-CM | POA: Diagnosis not present

## 2022-01-05 MED ORDER — ALBUTEROL SULFATE HFA 108 (90 BASE) MCG/ACT IN AERS: 2.0000 | INHALATION_SPRAY | RESPIRATORY_TRACT | 1 refills | Status: DC | PRN

## 2022-01-05 MED ORDER — ESCITALOPRAM OXALATE 10 MG PO TABS
10.0000 mg | ORAL_TABLET | Freq: Every day | ORAL | 1 refills | Status: DC
Start: 2022-01-05 — End: 2022-01-28

## 2022-01-05 MED ORDER — SUMATRIPTAN SUCCINATE 50 MG PO TABS: ORAL_TABLET | ORAL | 0 refills | Status: DC

## 2022-01-05 NOTE — Progress Notes (Signed)
New Patient Office Visit  Subjective:  Patient ID: Katherine Murray, female    DOB: 01-07-1981  Age: 41 y.o. MRN: [id number]  CC:  Chief Complaint  Patient presents with   New Patient (Initial Visit)    Weight loss--tried eat health, drink 6-8 bottles     HPI Katherine Murray is here to establish care as a new patient.  Prior provider was: Alma Friendly back in 2018.  Pt is with acute concerns.   Difficulty losing weight: walks four times a week, trying to eat right, 6-8 bottles of water a day and not sure why she isn't losing. Has tried go lo diet without any weight loss, only saw five pound weight loss over the one month. Has tried phentermine in the past without any success, curious if she could try wegovy. No chance of pregnancy.   Pap: 9/21//22 negative for intraepithelial lesion or malignancy, trichomonas was treated, gyn [facility], insertion of IUD placed 08/29/21, tolerating well. Occasional spotting.  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 Mom with recent fractures due to a fall and now needing more immediately care, in and out of the hospital . Nerves on edge. At this point so anxious affecting her daily life. Denies SI HI   GAD 7 : Generalized Anxiety Score 01/05/2022 02/13/2017  Nervous, Anxious, on Edge 3 3  Control/stop worrying 3 3  Worry too much - different things 3 3  Trouble relaxing 3 3  Restless 3 0  Easily annoyed or irritable 3 3  Afraid - awful might happen 0 0  Total GAD 7 Score 18 15  Anxiety Difficulty - Somewhat difficult   Flowsheet Row Office Visit from 01/05/2022 in [place] at [facility] Total Score 0       chronic concerns:  Endometriosis: doing well with IUD however spotting here and there. Due to f/u with gyn in the next few months.   Migraines:frequency 1 every two weeks. Lasts  for 24-48 hours. Frontal right sided headache, sharp and stabbing. Affects right eye with change in vision due to pain. Aura prior to onset. Photophobia. Imitrex as an abrupter which used to help just ran out of the medication. Nausea sometimes vomiting. Audio sensitive. No loss of function of limb.   Hyperlipidemia: will assess for cholesterol level today.   Past Medical History:  Diagnosis Date   A-fib ([place])    Asthma    Cancer ([place])    skin   Diverticulosis    Gallstones    Headache    Heart rate fast    Kidney stones    Lower abdominal pain    Right   Mitral regurgitation    Mild   Ovarian cyst 09/16/2016   right measuring 4.4 cm   Pneumonia    Tachycardia 08/01/2016   Tricuspid regurgitation    Trace   Vaginal infection     Past Surgical History:  Procedure Laterality Date   CESAREAN SECTION     1, 2012   CHOLECYSTECTOMY     2012   LAPAROSCOPIC OVARIAN CYSTECTOMY Right 09/24/2016   Procedure: LAPAROSCOPIC RIGHT OVARIAN CYSTECTOMY, Excision of ovarian cyst, Excision of endometeriosis,;  Surgeon: Brayton Mars, MD;  Location: [facility] ORS;  Service: Gynecology;  Laterality: Right;   TONSILLECTOMY     2008    Family History  Problem Relation Age of Onset   Diabetes Mother    Hypertension Mother    Hyperlipidemia Mother    Diabetes Father    Hypertension Father    Hyperlipidemia Father    Ovarian cancer Maternal Grandmother    Ulcerative colitis Maternal Grandmother    Stomach cancer Maternal Grandfather    Colon cancer Paternal Grandmother    Diabetes Paternal Grandmother    Kidney disease Paternal Grandfather    Breast cancer Neg Hx     Social History   Socioeconomic History   Marital status: Single    Spouse name: Not on file   Number of children: 1   Years of education: Not on file   Highest education level: Not on file  Occupational History   Occupation: [employer]  Tobacco Use   Smoking status: Some Days    Packs/day: 0.10     Years: 7.00    Pack years: 0.70    Types: Cigarettes   Smokeless tobacco: Never   Tobacco comments:    smokes "once or twice a week"  Vaping Use   Vaping Use: Never used  Substance and Sexual Activity   Alcohol use: Not Currently    Comment: rare   Drug use: No   Sexual activity: Yes    Partners: Male    Birth control/protection: Condom    Comment: same partner five years  Other Topics Concern   Not on file  Social History Narrative   Not on file   Social Determinants of Health   Financial Resource Strain: Not on file  Food Insecurity: Not on file  Transportation Needs: Not on file  Physical Activity: Not on file  Stress: Not on file  Social Connections: Not on file  Intimate Partner Violence: Not on file    Outpatient Medications Prior to Visit  Medication Sig Dispense Refill   levonorgestrel (MIRENA) 20 MCG/DAY IUD 1 each by Intrauterine route once.     albuterol (VENTOLIN HFA) 108 (90 Base) MCG/ACT inhaler Inhale into the lungs.     levonorgestrel (MIRENA) 20 MCG/DAY IUD 1 each by Intrauterine route once.     No facility-administered medications prior to visit.    Allergies  Allergen Reactions   Lyrica [Pregabalin] Swelling   Eggs Or Egg-Derived Products Rash and Swelling   Latex Rash    ROS Review of Systems  Constitutional:  Positive for unexpected weight change (hard to lose weight). Negative for chills and fatigue.  Respiratory:  Negative for cough and shortness of breath.   Cardiovascular:  Negative for chest pain and leg swelling.  Gastrointestinal:  Negative for diarrhea and nausea.  Endocrine: Negative for cold intolerance, heat intolerance, polydipsia, polyphagia and polyuria.  Genitourinary:  Positive for menstrual problem (occasional spotting however just recently with IUD placement) and vaginal bleeding (spotting intermittently). Negative for decreased urine volume, difficulty urinating, frequency, urgency, vaginal discharge and vaginal pain.   Psychiatric/Behavioral:  Positive for sleep disturbance (sleeping 5-6 hours a night due to anxiety). Negative for agitation and suicidal ideas. The patient is nervous/anxious.   All other systems reviewed and are negative.     Objective:    Physical Exam Vitals reviewed.  Constitutional:      General: She is not in acute distress.    Appearance: Normal appearance. She is obese. She is not ill-appearing or toxic-appearing.  HENT:     Mouth/Throat:     Mouth: Mucous membranes are moist.     Pharynx: No pharyngeal swelling.  Tonsils: No tonsillar exudate.  Neck:     Thyroid: No thyroid mass, thyromegaly or thyroid tenderness.  Cardiovascular:     Rate and Rhythm: Normal rate and regular rhythm.  Pulmonary:     Effort: Pulmonary effort is normal.     Breath sounds: Normal breath sounds.  Abdominal:     General: Abdomen is flat. Bowel sounds are normal.     Palpations: Abdomen is soft.  Musculoskeletal:        General: Normal range of motion.     Cervical back: No tenderness.  Lymphadenopathy:     Cervical:     Right cervical: No superficial cervical adenopathy.    Left cervical: No superficial cervical adenopathy.  Skin:    General: Skin is warm.     Capillary Refill: Capillary refill takes less than 2 seconds.  Neurological:     General: No focal deficit present.     Mental Status: She is alert and oriented to person, place, and time.  Psychiatric:        Mood and Affect: Mood normal.        Behavior: Behavior normal.        Thought Content: Thought content normal.        Judgment: Judgment normal.     BP 132/68    Pulse 100    Ht 5\' 5"  (1.651 m)    Wt 225 lb (102.1 kg)    SpO2 98%    BMI 37.44 kg/m  Wt Readings from Last 3 Encounters:  01/05/22 225 lb (102.1 kg)  08/29/21 223 lb 1.6 oz (101.2 kg)  08/15/21 192 lb (87.1 kg)     There are no preventive care reminders to display for this patient.   There are no preventive care reminders to display for this  patient.  Lab Results  Component Value Date   TSH 1.161 10/02/2016   Lab Results  Component Value Date   WBC 8.0 07/15/2019   HGB 13.8 07/15/2019   HCT 39.4 07/15/2019   MCV 96.8 07/15/2019   PLT 261 07/15/2019   Lab Results  Component Value Date   NA 141 07/15/2019   K 3.3 (L) 07/15/2019   CO2 22 07/15/2019   GLUCOSE 108 (H) 07/15/2019   BUN 10 07/15/2019   CREATININE 0.69 07/15/2019   BILITOT 0.9 08/09/2018   ALKPHOS 44 08/09/2018   AST 50 (H) 08/09/2018   ALT 33 08/09/2018   PROT 7.8 08/09/2018   ALBUMIN 4.7 08/09/2018   CALCIUM 8.8 (L) 07/15/2019   ANIONGAP 12 07/15/2019   GFR 103.69 07/02/2014   Lab Results  Component Value Date   CHOL 219 (H) 10/02/2016   Lab Results  Component Value Date   HDL 42 10/02/2016   Lab Results  Component Value Date   LDLCALC 127 (H) 10/02/2016   Lab Results  Component Value Date   TRIG 252 (H) 10/02/2016   Lab Results  Component Value Date   CHOLHDL 5.2 10/02/2016   No results found for: HGBA1C    Assessment & Plan:   Problem List Items Addressed This Visit       Cardiovascular and Mediastinum   Migraines    Refill Imitrex sent to pharmacy patient to use as needed.  Keep headache diary and bring to next visit try to avoid triggers, I am hopeful that management of anxiety with medication will may be reduce the frequency of her migraines      Relevant Medications   escitalopram (LEXAPRO) 10  MG tablet   SUMAtriptan (IMITREX) 50 MG tablet     Respiratory   Mild intermittent asthma without complication    Refill for albuterol to use if needed otherwise stable      Relevant Medications   albuterol (VENTOLIN HFA) 108 (90 Base) MCG/ACT inhaler     Other   Endometriosis determined by laparoscopy    Stable controlled only spotting.  Patient just had IUD placed in October follow-up with GYN in the next few months      Mixed hyperlipidemia    Continue low-cholesterol diet and exercise as tolerated.  Ordering  lipid panel pending results      Relevant Orders   Lipid panel   GAD (generalized anxiety disorder)    Starting on Lexapro 10 mg once daily.  I instructed pt to start 1/2 tablet once daily for 1 week and then increase to a full tablet once daily on week two as tolerated.  We discussed common side effects such as nausea, drowsiness and weight gain.  Also discussed rare but serious side effect of suicidal ideation.  She is instructed to discontinue medication and go directly to ED if this occurs.  Pt verbalizes understanding.  Plan is to follow up in 30 days to evaluate progress.          Relevant Medications   escitalopram (LEXAPRO) 10 MG tablet   Hypokalemia - Primary    Repeat potassium with CMP today      Other fatigue    Will assess thyroid and CBC pending results of labs      Relevant Orders   CBC w/Diff   Comprehensive metabolic panel   T3, free   T4, free   TSH   Weight gain    TSH ordered pending results      Relevant Orders   CBC w/Diff   Comprehensive metabolic panel   T3, free   T4, free   TSH   Family history of diabetes mellitus type II    Will assess A1c especially with patient difficulty losing weight.      Relevant Orders   CBC w/Diff   Comprehensive metabolic panel   Hemoglobin A1c   Morbid obesity ([place])    We will consider with [place] pending A1c to help with if needed prior Auth.  Will order once lab work received.  Patient has had treatment failure with phentermine in the past as well as a Golo diet that she did over the Internet with no success      Relevant Orders   CBC w/Diff   Comprehensive metabolic panel    Meds ordered this encounter  Medications   escitalopram (LEXAPRO) 10 MG tablet    Sig: Take 1 tablet (10 mg total) by mouth daily.    Dispense:  30 tablet    Refill:  1    Order Specific Question:   Supervising Provider    Answer:   BEDSOLE, AMY E [[id number]]   albuterol (VENTOLIN HFA) 108 (90 Base) MCG/ACT inhaler    Sig: Inhale  2 puffs into the lungs every 4 (four) hours as needed for wheezing or shortness of breath.    Dispense:  1 each    Refill:  1    Order Specific Question:   Supervising Provider    Answer:   BEDSOLE, AMY E [[id number]]   SUMAtriptan (IMITREX) 50 MG tablet    Sig: Take one po x 1 , may repeat dose x 1 after 2 hours if still with  symptoms    Dispense:  10 tablet    Refill:  0    Order Specific Question:   Supervising Provider    Answer:   Diona Browner, AMY E [[id number]]    Follow-up: Return in about 1 month (around 02/02/2022) for regular follow up on medication.    Eugenia Pancoast, FNP

## 2022-01-05 NOTE — Assessment & Plan Note (Signed)
Repeat potassium with CMP today

## 2022-01-05 NOTE — Assessment & Plan Note (Addendum)
Refill Imitrex sent to pharmacy patient to use as needed.  Keep headache diary and bring to next visit try to avoid triggers, I am hopeful that management of anxiety with medication will may be reduce the frequency of her migraines

## 2022-01-05 NOTE — Assessment & Plan Note (Signed)
Will assess A1c especially with patient difficulty losing weight.

## 2022-01-05 NOTE — Assessment & Plan Note (Signed)
Stable controlled only spotting.  Patient just had IUD placed in October follow-up with GYN in the next few months

## 2022-01-05 NOTE — Assessment & Plan Note (Signed)
TSH ordered pending results

## 2022-01-05 NOTE — Assessment & Plan Note (Addendum)
We will consider with [place] pending A1c to help with if needed prior Auth.  Will order once lab work received.  Patient has had treatment failure with phentermine in the past as well as a Golo diet that she did over the Internet with no success

## 2022-01-05 NOTE — Assessment & Plan Note (Signed)
Will assess thyroid and CBC pending results of labs

## 2022-01-05 NOTE — Assessment & Plan Note (Addendum)
Starting on Lexapro 10 mg once daily.  I instructed pt to start 1/2 tablet once daily for 1 week and then increase to a full tablet once daily on week two as tolerated.  We discussed common side effects such as nausea, drowsiness and weight gain.  Also discussed rare but serious side effect of suicidal ideation.  She is instructed to discontinue medication and go directly to ED if this occurs.  Pt verbalizes understanding.  Plan is to follow up in 30 days to evaluate progress.     Total time with patient in exam room discussing history as well as diving into acute concerns lasted over 45 minutes.

## 2022-01-05 NOTE — Assessment & Plan Note (Signed)
Refill for albuterol to use if needed otherwise stable

## 2022-01-05 NOTE — Assessment & Plan Note (Signed)
Continue low-cholesterol diet and exercise as tolerated.  Ordering lipid panel pending results

## 2022-01-05 NOTE — Patient Instructions (Signed)
Start lexapro 10 mg once daily for anxiety and depression. Take 1/2 tablet by mouth once daily for about one week, then increase to 1 full tablet thereafter.   Taking the medicine as directed and not missing any doses is one of the best things you can do to treat your anxiety.   Here are some things to keep in mind:  Side effects (stomach upset, some increased anxiety) may happen before you notice a benefit.  These side effects typically go away over time. Changes to your dose of medicine or a change in medication all together is sometimes necessary Many people will notice an improvement within two weeks but the full effect of the medication can take up to 4-6 weeks Stopping the medication when you start feeling better often results in a return of symptoms. Most people need to be on medication at least 6-12 months If you start having thoughts of hurting yourself or others after starting this medicine, please call me immediately.    Stop by the lab prior to leaving today. I will notify you of your results once received.   Once labwork received will try to prescribe wegovy.   It was a pleasure seeing you today! Please do not hesitate to reach out with any questions and or concerns.  Regards,   Eugenia Pancoast FNP-C

## 2022-01-06 LAB — CBC WITH DIFFERENTIAL/PLATELET
Absolute Monocytes: 470 cells/uL (ref 200–950)
Basophils Absolute: 57 cells/uL (ref 0–200)
Basophils Relative: 0.7 %
Eosinophils Absolute: 170 cells/uL (ref 15–500)
Eosinophils Relative: 2.1 %
HCT: 39.3 % (ref 35.0–45.0)
Hemoglobin: 13.5 g/dL (ref 11.7–15.5)
Lymphs Abs: 2981 cells/uL (ref 850–3900)
MCH: 31.8 pg (ref 27.0–33.0)
MCHC: 34.4 g/dL (ref 32.0–36.0)
MCV: 92.7 fL (ref 80.0–100.0)
MPV: 10.9 fL (ref 7.5–12.5)
Monocytes Relative: 5.8 %
Neutro Abs: 4423 cells/uL (ref 1500–7800)
Neutrophils Relative %: 54.6 %
Platelets: 314 10*3/uL (ref 140–400)
RBC: 4.24 10*6/uL (ref 3.80–5.10)
RDW: 13.3 % (ref 11.0–15.0)
Total Lymphocyte: 36.8 %
WBC: 8.1 10*3/uL (ref 3.8–10.8)

## 2022-01-06 LAB — COMPREHENSIVE METABOLIC PANEL
AG Ratio: 1.9 (calc) (ref 1.0–2.5)
ALT: 17 U/L (ref 6–29)
AST: 18 U/L (ref 10–30)
Albumin: 4.3 g/dL (ref 3.6–5.1)
Alkaline phosphatase (APISO): 44 U/L (ref 31–125)
BUN: 14 mg/dL (ref 7–25)
CO2: 24 mmol/L (ref 20–32)
Calcium: 9.3 mg/dL (ref 8.6–10.2)
Chloride: 105 mmol/L (ref 98–110)
Creat: 0.87 mg/dL (ref 0.50–0.99)
Globulin: 2.3 g/dL (calc) (ref 1.9–3.7)
Glucose, Bld: 81 mg/dL (ref 65–99)
Potassium: 4.1 mmol/L (ref 3.5–5.3)
Sodium: 141 mmol/L (ref 135–146)
Total Bilirubin: 0.4 mg/dL (ref 0.2–1.2)
Total Protein: 6.6 g/dL (ref 6.1–8.1)

## 2022-01-06 LAB — T3, FREE: T3, Free: 3.6 pg/mL (ref 2.3–4.2)

## 2022-01-06 LAB — HEMOGLOBIN A1C
Hgb A1c MFr Bld: 5.1 % of total Hgb (ref <5.7)
Mean Plasma Glucose: 100 mg/dL
eAG (mmol/L): 5.5 mmol/L

## 2022-01-06 LAB — LIPID PANEL
Cholesterol: 192 mg/dL (ref <200)
HDL: 42 mg/dL — ABNORMAL LOW (ref >=50)
LDL Cholesterol (Calc): 104 mg/dL (calc) — ABNORMAL HIGH
Non-HDL Cholesterol (Calc): 150 mg/dL (calc) — ABNORMAL HIGH (ref <130)
Total CHOL/HDL Ratio: 4.6 (calc) (ref <5.0)
Triglycerides: 344 mg/dL — ABNORMAL HIGH (ref <150)

## 2022-01-06 LAB — TSH: TSH: 2.05 mIU/L

## 2022-01-06 LAB — T4, FREE: Free T4: 1.2 ng/dL (ref 0.8–1.8)

## 2022-01-08 ENCOUNTER — Encounter: Payer: Self-pay | Admitting: Family

## 2022-01-08 ENCOUNTER — Other Ambulatory Visit: Payer: Self-pay | Admitting: Family

## 2022-01-08 DIAGNOSIS — R635 Abnormal weight gain: Secondary | ICD-10-CM

## 2022-01-08 MED ORDER — WEGOVY 0.25 MG/0.5ML ~~LOC~~ SOAJ: 0.2500 mg | SUBCUTANEOUS | 0 refills | Status: AC

## 2022-01-15 ENCOUNTER — Telehealth: Payer: Self-pay | Admitting: *Deleted

## 2022-01-15 NOTE — Telephone Encounter (Signed)
Start Prior Authorization-Semaglutide-Weight Management (WEGOVY) 0.25 MG/0.5ML SO[facility]--waiting for approval

## 2022-01-18 ENCOUNTER — Encounter: Payer: Self-pay | Admitting: Family

## 2022-01-22 ENCOUNTER — Telehealth: Payer: Self-pay | Admitting: *Deleted

## 2022-01-22 ENCOUNTER — Encounter: Payer: Self-pay | Admitting: *Deleted

## 2022-01-22 NOTE — Telephone Encounter (Signed)
PA- for Wegovy 0.'25mg'$ /0.5 ml-been denied due to prescription benefit plan does not cover the requested medication. ?

## 2022-01-23 ENCOUNTER — Other Ambulatory Visit: Payer: Self-pay | Admitting: Family

## 2022-01-23 NOTE — Telephone Encounter (Signed)
The PA--did not list any alternatives but sent [facility] message to pt to call the insurance. ?

## 2022-01-25 ENCOUNTER — Other Ambulatory Visit: Payer: Self-pay | Admitting: Family

## 2022-01-25 DIAGNOSIS — G43109 Migraine with aura, not intractable, without status migrainosus: Secondary | ICD-10-CM

## 2022-01-26 ENCOUNTER — Ambulatory Visit: Payer: 59 | Admitting: Family

## 2022-01-27 ENCOUNTER — Other Ambulatory Visit: Payer: Self-pay | Admitting: Family

## 2022-01-27 DIAGNOSIS — F411 Generalized anxiety disorder: Secondary | ICD-10-CM

## 2022-02-12 ENCOUNTER — Encounter: Payer: Self-pay | Admitting: Family

## 2022-02-12 DIAGNOSIS — G44209 Tension-type headache, unspecified, not intractable: Secondary | ICD-10-CM

## 2022-02-13 MED ORDER — BUTALBITAL-APAP-CAFFEINE 50-325-40 MG PO TABS: ORAL_TABLET | ORAL | 0 refills | Status: DC

## 2022-02-23 ENCOUNTER — Ambulatory Visit: Payer: 59 | Admitting: Family

## 2022-02-26 ENCOUNTER — Other Ambulatory Visit: Payer: Self-pay | Admitting: Family

## 2022-02-26 DIAGNOSIS — F411 Generalized anxiety disorder: Secondary | ICD-10-CM

## 2022-03-06 ENCOUNTER — Other Ambulatory Visit: Payer: Self-pay | Admitting: Family

## 2022-03-06 DIAGNOSIS — G44209 Tension-type headache, unspecified, not intractable: Secondary | ICD-10-CM

## 2022-03-06 DIAGNOSIS — F411 Generalized anxiety disorder: Secondary | ICD-10-CM

## 2022-03-06 MED ORDER — ESCITALOPRAM OXALATE 10 MG PO TABS: 10.0000 mg | ORAL_TABLET | Freq: Every day | ORAL | 0 refills | Status: DC

## 2022-03-12 MED ORDER — BUTALBITAL-APAP-CAFFEINE 50-325-40 MG PO TABS: ORAL_TABLET | ORAL | 0 refills | Status: DC

## 2022-03-19 ENCOUNTER — Encounter: Payer: Self-pay | Admitting: Family

## 2022-03-19 DIAGNOSIS — F411 Generalized anxiety disorder: Secondary | ICD-10-CM

## 2022-03-20 MED ORDER — HYDROXYZINE PAMOATE 50 MG PO CAPS: 50.0000 mg | ORAL_CAPSULE | Freq: Three times a day (TID) | ORAL | 0 refills | Status: DC | PRN

## 2022-03-28 ENCOUNTER — Other Ambulatory Visit: Payer: Self-pay | Admitting: Family

## 2022-03-28 DIAGNOSIS — G43109 Migraine with aura, not intractable, without status migrainosus: Secondary | ICD-10-CM

## 2022-03-31 ENCOUNTER — Other Ambulatory Visit: Payer: Self-pay | Admitting: Family

## 2022-03-31 DIAGNOSIS — F411 Generalized anxiety disorder: Secondary | ICD-10-CM

## 2022-04-03 ENCOUNTER — Encounter: Payer: Self-pay | Admitting: Family

## 2022-04-03 DIAGNOSIS — G44209 Tension-type headache, unspecified, not intractable: Secondary | ICD-10-CM

## 2022-04-03 MED ORDER — ESCITALOPRAM OXALATE 20 MG PO TABS: 20.0000 mg | ORAL_TABLET | Freq: Every day | ORAL | 0 refills | Status: DC

## 2022-04-09 ENCOUNTER — Other Ambulatory Visit: Payer: Self-pay | Admitting: Family

## 2022-04-09 DIAGNOSIS — G44209 Tension-type headache, unspecified, not intractable: Secondary | ICD-10-CM

## 2022-04-20 ENCOUNTER — Other Ambulatory Visit: Payer: Self-pay | Admitting: Family

## 2022-04-20 DIAGNOSIS — G44209 Tension-type headache, unspecified, not intractable: Secondary | ICD-10-CM

## 2022-04-20 MED ORDER — BUTALBITAL-APAP-CAFFEINE 50-325-40 MG PO TABS: ORAL_TABLET | ORAL | 0 refills | Status: DC

## 2022-04-23 ENCOUNTER — Telehealth: Payer: Self-pay

## 2022-04-23 NOTE — Telephone Encounter (Signed)
Left message to return call to our office.  Needs to make an appt with Tabitha.

## 2022-04-23 NOTE — Telephone Encounter (Signed)
-----   Message from Eugenia Pancoast, [place] sent at 04/20/2022  3:20 PM EDT ----- So as far as neurology referral, neurology is unwilling to accept referral until pt seen in office and office note in regards to tension headaches. Because we spoke about headaches only by [employer] we need an in office visit to get referral approved per insurance. Please have pt schedule in office appt w me to get this started.

## 2022-05-25 ENCOUNTER — Other Ambulatory Visit: Payer: Self-pay | Admitting: Family

## 2022-05-25 DIAGNOSIS — J4520 Mild intermittent asthma, uncomplicated: Secondary | ICD-10-CM

## 2022-06-20 ENCOUNTER — Other Ambulatory Visit: Payer: Self-pay | Admitting: Family

## 2022-06-20 DIAGNOSIS — G43109 Migraine with aura, not intractable, without status migrainosus: Secondary | ICD-10-CM

## 2022-06-20 DIAGNOSIS — G44209 Tension-type headache, unspecified, not intractable: Secondary | ICD-10-CM

## 2022-06-20 DIAGNOSIS — J4520 Mild intermittent asthma, uncomplicated: Secondary | ICD-10-CM

## 2022-06-21 ENCOUNTER — Other Ambulatory Visit: Payer: Self-pay | Admitting: Family

## 2022-06-21 MED ORDER — ESCITALOPRAM OXALATE 20 MG PO TABS: 20.0000 mg | ORAL_TABLET | Freq: Every day | ORAL | 0 refills | Status: DC

## 2022-06-28 ENCOUNTER — Ambulatory Visit (INDEPENDENT_AMBULATORY_CARE_PROVIDER_SITE_OTHER): Payer: Medicaid Other | Admitting: Obstetrics and Gynecology

## 2022-06-28 ENCOUNTER — Encounter: Payer: Self-pay | Admitting: Obstetrics and Gynecology

## 2022-06-28 VITALS — BP 129/88 | HR 74 | Ht 65.0 in | Wt 223.2 lb

## 2022-06-28 DIAGNOSIS — R102 Pelvic and perineal pain: Secondary | ICD-10-CM

## 2022-06-28 DIAGNOSIS — Z8742 Personal history of other diseases of the female genital tract: Secondary | ICD-10-CM | POA: Diagnosis not present

## 2022-06-28 DIAGNOSIS — Z30432 Encounter for removal of intrauterine contraceptive device: Secondary | ICD-10-CM

## 2022-06-28 NOTE — Progress Notes (Signed)
HPI:      [name] is a 41 y.o. G2P1001 who LMP was Patient's last menstrual period was 06/12/2022 (approximate).  Subjective:   She presents today with complaint of a few weeks history of right-sided pelvic pain.  She reports her pain a 7 out of 10.  She has a history of surgically diagnosed endometriosis but has been doing well with IUD.  She currently has pain with intercourse and bowel movements. She reports she cannot currently feel her IUD strings. (She did not previously present for her 4-week string check) She says that she will consider hysterectomy if the IUD is currently causing this pain.    Hx: The following portions of the patient's history were reviewed and updated as appropriate:             She  has a past medical history of A-fib ([place]), Asthma, Cancer ([place]), Diverticulosis, Gallstones, Headache, Heart rate fast, Kidney stones, Lower abdominal pain, Mitral regurgitation, Ovarian cyst (09/16/2016), Pneumonia, Tachycardia (08/01/2016), Tricuspid regurgitation, and Vaginal infection. She does not have any pertinent problems on file. She  has a past surgical history that includes Cholecystectomy; Cesarean section; Tonsillectomy; and Laparoscopic ovarian cystectomy (Right, 09/24/2016). Her family history includes Colon cancer in her paternal grandmother; Diabetes in her father, mother, and paternal grandmother; Hyperlipidemia in her father and mother; Hypertension in her father and mother; Kidney disease in her paternal grandfather; Ovarian cancer in her maternal grandmother; Stomach cancer in her maternal grandfather; Ulcerative colitis in her maternal grandmother. She  reports that she has been smoking cigarettes. She has a 0.70 pack-year smoking history. She has never used smokeless tobacco. She reports that she does not currently use alcohol. She reports that she does not use drugs. She has a current medication list which includes the following prescription(s): albuterol,  escitalopram, levonorgestrel, and sumatriptan. She is allergic to lyrica [pregabalin], eggs or egg-derived products, and latex.       Review of Systems:  Review of Systems  Constitutional: Denied constitutional symptoms, night sweats, recent illness, fatigue, fever, insomnia and weight loss.  Eyes: Denied eye symptoms, eye pain, photophobia, vision change and visual disturbance.  Ears/Nose/Throat/Neck: Denied ear, nose, throat or neck symptoms, hearing loss, nasal discharge, sinus congestion and sore throat.  Cardiovascular: Denied cardiovascular symptoms, arrhythmia, chest pain/pressure, edema, exercise intolerance, orthopnea and palpitations.  Respiratory: Denied pulmonary symptoms, asthma, pleuritic pain, productive sputum, cough, dyspnea and wheezing.  Gastrointestinal: Denied, gastro-esophageal reflux, melena, nausea and vomiting.  Genitourinary: See HPI for additional information.  Musculoskeletal: Denied musculoskeletal symptoms, stiffness, swelling, muscle weakness and myalgia.  Dermatologic: Denied dermatology symptoms, rash and scar.  Neurologic: Denied neurology symptoms, dizziness, headache, neck pain and syncope.  Psychiatric: Denied psychiatric symptoms, anxiety and depression.  Endocrine: Denied endocrine symptoms including hot flashes and night sweats.   Meds:   Current Outpatient Medications on File Prior to Visit  Medication Sig Dispense Refill   albuterol (VENTOLIN HFA) 108 (90 Base) MCG/ACT inhaler Inhale 2 puffs into the lungs every 4 (four) hours as needed for wheezing or shortness of breath. 1 each 1   escitalopram (LEXAPRO) 20 MG tablet Take 1 tablet (20 mg total) by mouth daily. 90 tablet 0   levonorgestrel (MIRENA) 20 MCG/DAY IUD 1 each by Intrauterine route once.     SUMAtriptan (IMITREX) 50 MG tablet TAKE ONE TABLET BY MOUTH X 1 , MAY REPEAT DOSE X 1 AFTER 2 HOURS IF STILL WITH SYMPTOMS 9 tablet 1   No current facility-administered medications  on file prior to  visit.      Objective:     Vitals:   06/28/22 1159  BP: 129/88  Pulse: 74   Filed Weights   06/28/22 1159  Weight: 223 lb 3.2 oz (101.2 kg)              Physical examination   Pelvic:   Vulva: Normal appearance.  No lesions.  Vagina: No lesions or abnormalities noted.  Support: Normal pelvic support.  Urethra No masses tenderness or scarring.  Meatus Normal size without lesions or prolapse.  Cervix: Normal appearance.  No lesions. IUD strings noted at cervical os.  Anus: Normal exam.  No lesions.  Perineum: Normal exam.  No lesions.        Bimanual   Uterus: Normal size.  Non-tender.  Mobile.  AV.  Adnexae: Right adnexal pain  Cul-de-sac: Negative for abnormality.             Assessment:    G2P1001 Patient Active Problem List   Diagnosis Date Noted   Hypokalemia 01/05/2022   Mild intermittent asthma without complication 50/38/8828   Other fatigue 01/05/2022   Weight gain 01/05/2022   Family history of diabetes mellitus type II 01/05/2022   Morbid obesity ([place]) 01/05/2022   GAD (generalized anxiety disorder) 02/13/2017   Mixed hyperlipidemia 10/04/2016   Endometriosis determined by laparoscopy 09/24/2016   Migraines 08/01/2016     1. Pelvic pain in female   2. History of endometriosis     IUD strings look correct and I suspect the IUD is correctly positioned as patient was doing well with it  Possible right-sided ovarian cyst.   Plan:            1.  Ultrasound to identify IUD position and check for causes of pelvic pain including right ovarian cyst.  2.  Will discuss management schemes after ultrasound results return.  3.  Patient needs an annual examination in the next few months. Orders Orders Placed This Encounter  Procedures   US PELVIS (TRANSABDOMINAL ONLY)   US PELVIS TRANSVAGINAL NON-OB (TV ONLY)    No orders of the defined types were placed in this encounter.     F/U  Return for We will contact her with any abnormal test results. I  spent 22 minutes involved in the care of this patient preparing to see the patient by obtaining and reviewing her medical history (including labs, imaging tests and prior procedures), documenting clinical information in the electronic health record (EHR), counseling and coordinating care plans, writing and sending prescriptions, ordering tests or procedures and in direct communicating with the patient and medical staff discussing pertinent items from her history and physical exam.  Finis Bud, M.D. 06/28/2022 12:28 PM

## 2022-06-28 NOTE — Progress Notes (Signed)
Patient presents today for IUD removal. She states since IUD she has had irregular bleeding and spotting along with a consistent pelvic pain. She would like to discuss a hysterectomy due to her endometriosis. Patient states no other questions or concerns.

## 2022-07-03 ENCOUNTER — Other Ambulatory Visit: Payer: Medicaid Other

## 2022-07-10 ENCOUNTER — Other Ambulatory Visit: Payer: Medicaid Other

## 2022-09-21 ENCOUNTER — Other Ambulatory Visit: Payer: Self-pay | Admitting: Family

## 2022-09-25 ENCOUNTER — Telehealth: Payer: Self-pay | Admitting: Obstetrics and Gynecology

## 2022-09-25 MED ORDER — ESCITALOPRAM OXALATE 20 MG PO TABS: 20.0000 mg | ORAL_TABLET | Freq: Every day | ORAL | 0 refills | Status: AC

## 2022-09-25 NOTE — Telephone Encounter (Signed)
Patient had ultrasound order Aug of 2023. Patient cancelled scheduled appointment on 07/03/22 and No  showed 07/10/22. I contacted patient via phone. I left voicemail for patient to call back to be scheduled.

## 2022-10-25 NOTE — Telephone Encounter (Signed)
I contacted patient via phone. I left voicemail for patient to call back to be scheduled.  My chart message sent also.

## 2022-11-02 NOTE — Telephone Encounter (Signed)
I contacted patient via phone. I left voicemail for patient to call back to be scheduled.   

## 2023-09-29 ENCOUNTER — Other Ambulatory Visit: Payer: Self-pay

## 2023-09-29 ENCOUNTER — Emergency Department
Admission: EM | Admit: 2023-09-29 | Discharge: 2023-09-29 | Disposition: A | Discharge status: Home / Self Care | Payer: Medicaid Other | Attending: Emergency Medicine | Admitting: Emergency Medicine

## 2023-09-29 DIAGNOSIS — K047 Periapical abscess without sinus: Secondary | ICD-10-CM | POA: Insufficient documentation

## 2023-09-29 DIAGNOSIS — R519 Headache, unspecified: Secondary | ICD-10-CM | POA: Diagnosis present

## 2023-09-29 LAB — BASIC METABOLIC PANEL
Anion gap: 9 (ref 5–15)
BUN: 8 mg/dL (ref 6–20)
CO2: 27 mmol/L (ref 22–32)
Calcium: 9 mg/dL (ref 8.9–10.3)
Chloride: 101 mmol/L (ref 98–111)
Creatinine, Ser: 0.79 mg/dL (ref 0.44–1.00)
GFR, Estimated: >60 mL/min (ref >60)
Glucose, Bld: 98 mg/dL (ref 70–99)
Potassium: 3.9 mmol/L (ref 3.5–5.1)
Sodium: 137 mmol/L (ref 135–145)

## 2023-09-29 LAB — CBC
HCT: 43.1 % (ref 36.0–46.0)
Hemoglobin: 15.1 g/dL — ABNORMAL HIGH (ref 12.0–15.0)
MCH: 33.3 pg (ref 26.0–34.0)
MCHC: 35 g/dL (ref 30.0–36.0)
MCV: 95.1 fL (ref 80.0–100.0)
Platelets: 292 10*3/uL (ref 150–400)
RBC: 4.53 MIL/uL (ref 3.87–5.11)
RDW: 13.2 % (ref 11.5–15.5)
WBC: 7.4 10*3/uL (ref 4.0–10.5)
nRBC: 0 % (ref 0.0–0.2)

## 2023-09-29 LAB — LACTIC ACID, PLASMA: Lactic Acid, Venous: 0.7 mmol/L (ref 0.5–1.9)

## 2023-09-29 MED ORDER — CLINDAMYCIN HCL 300 MG PO CAPS: 300.0000 mg | ORAL_CAPSULE | Freq: Three times a day (TID) | ORAL | 0 refills | Status: AC

## 2023-09-29 MED ORDER — ONDANSETRON 4 MG PO TBDP: 4.0000 mg | ORAL_TABLET | Freq: Three times a day (TID) | ORAL | 0 refills | Status: DC | PRN

## 2023-09-29 NOTE — ED Triage Notes (Addendum)
Pt comes with c/o abscess on right lower jaw. Pt states the swelling has gotten worse.. Pt states she was prescribed meds on Friday and started them. Pt states she has been taking meds and pain meds and still hurting. Pt states she did have fever this morning of 101 and took tylenol at [facility]

## 2023-09-29 NOTE — ED Notes (Addendum)
See triage note  Presents with possible abscess  States she was told that her abscess was in her gum  Placed on antibiotic  Also has been using ice to her face  States she has had fever at home  but is currently afebrile

## 2023-09-29 NOTE — ED Provider Notes (Signed)
[facility] Provider Note    Event Date/Time   First MD Initiated Contact with Patient 09/29/23 1002     (approximate)   History   Abscess   HPI  Katherine Murray is a 42 y.o. female is to the ED with complaint of right-sided facial pain.  Patient was seen by her PCP on Friday at which time was felt that she had a dental abscess and started on Keflex.  Patient does report some nausea and has not been able to eat due to pain in the same area.  She reports that this morning she had a fever.  Patient has history of migraines, asthma, abdomen D deficiency and anxiety.    Physical Exam   Triage Vital Signs: ED Triage Vitals [09/29/23 0943]  Encounter Vitals Group     BP (!) 153/111     Systolic BP Percentile      Diastolic BP Percentile      Pulse Rate (!) 116     Resp 18     Temp 97.9 F (36.6 C)     Temp src      SpO2 100 %     Weight 191 lb (86.6 kg)     Height 5\' 5"  (1.651 m)     Head Circumference      Peak Flow      Pain Score 8     Pain Loc      Pain Education      Exclude from Growth Chart     Most recent vital signs: Vitals:   09/29/23 0943 09/29/23 1102  BP: (!) 153/111 123/85  Pulse: (!) 116   Resp: 18   Temp: 97.9 F (36.6 C)   SpO2: 100%      General: Awake, no distress.  CV:  Good peripheral perfusion.  Resp:  Normal effort.  Abd:  No distention.  Other:  Right lower mandible with tenderness on palpation gum area anterior lateral without obvious abscess.  Has some minimal soft tissue edema adjacent to the tooth in question.  Neck is supple without cervical lymphadenopathy.   ED Results / Procedures / Treatments   Labs (all labs ordered are listed, but only abnormal results are displayed) Labs Reviewed  CBC - Abnormal; Notable for the following components:      Result Value   Hemoglobin 15.1 (*)    All other components within normal limits  BASIC METABOLIC PANEL  LACTIC ACID, PLASMA  LACTIC ACID, PLASMA      PROCEDURES:  Critical Care performed:   Procedures   MEDICATIONS ORDERED IN ED: Medications - No data to display   IMPRESSION / MDM / ASSESSMENT AND PLAN / ED COURSE  I reviewed the triage vital signs and the nursing notes.   Differential diagnosis includes, but is not limited to, dental abscess, dental pain, gingivitis.  Patient presents to the ED with complaint of dental abscess.  Lab work was unremarkable and patient was reassured.  Patient was placed on clindamycin to begin taking today along with Zofran as needed for pain.  She is also encouraged to take Tylenol or ibuprofen as needed for nausea.  She states that she already has a Education officer, community and is encouraged to call him tomorrow to make an appointment.      Patient's presentation is most consistent with acute, uncomplicated illness.  FINAL CLINICAL IMPRESSION(S) / ED DIAGNOSES   Final diagnoses:  Dental abscess     Rx / DC Orders  ED Discharge Orders          Ordered    ondansetron (ZOFRAN-ODT) 4 MG disintegrating tablet  Every 8 hours PRN        09/29/23 1056    clindamycin (CLEOCIN) 300 MG capsule  3 times daily        09/29/23 1056             Note:  This document was prepared using Dragon voice recognition software and may include unintentional dictation errors.   Tommi Rumps, PA-C 09/29/23 1450    Corena Herter, MD 10/01/23 [phone]

## 2023-09-29 NOTE — Discharge Instructions (Signed)
Call your dentist on Monday to make an appointment.  A prescription for clindamycin was sent to the pharmacy for you to begin taking 3 times daily until completely finished.  Continue with Tylenol ibuprofen as needed for pain.  A prescription for Zofran was sent to the pharmacy to take as needed for nausea.  Increase fluids and soft foods such as rice, baked potato, yogurt and chew on the opposite side.  Avoid foods that need a great deal of chewing such as steak which will cause increased pain.

## 2023-10-03 ENCOUNTER — Telehealth: Payer: Self-pay

## 2023-10-03 NOTE — Transitions of Care (Post Inpatient/ED Visit) (Signed)
Unable to reach patient by phone and left v/m requesting call back at [phone].       10/03/2023  Name: Katherine Murray MRN: [id number] DOB: [birth year]  Today's TOC FU Call Status: Today's TOC FU Call Status:: Unsuccessful Call (1st Attempt) Unsuccessful Call (1st Attempt) Date: 10/03/23  Attempted to reach the patient regarding the most recent Inpatient/ED visit.  Follow Up Plan: Additional outreach attempts will be made to reach the patient to complete the Transitions of Care (Post Inpatient/ED visit) call.   Signature Lewanda Rife, LPN

## 2023-10-04 NOTE — Transitions of Care (Post Inpatient/ED Visit) (Signed)
Unable to reach patient by phone and left v/m requesting call back at [phone].       10/04/2023  Name: Katherine Murray MRN: [id number] DOB: October 05, 1981  Today's TOC FU Call Status: Today's TOC FU Call Status:: Unsuccessful Call (2nd Attempt) Unsuccessful Call (1st Attempt) Date: 10/03/23 Unsuccessful Call (2nd Attempt) Date: 10/04/23  Attempted to reach the patient regarding the most recent Inpatient/ED visit.  Follow Up Plan: Additional outreach attempts will be made to reach the patient to complete the Transitions of Care (Post Inpatient/ED visit) call.   Signature Lewanda Rife, LPN

## 2023-10-28 ENCOUNTER — Encounter: Payer: Self-pay | Admitting: Internal Medicine

## 2023-10-28 DIAGNOSIS — Z1231 Encounter for screening mammogram for malignant neoplasm of breast: Secondary | ICD-10-CM

## 2023-12-25 ENCOUNTER — Encounter: Payer: Self-pay | Admitting: Internal Medicine

## 2023-12-25 ENCOUNTER — Other Ambulatory Visit: Payer: Self-pay

## 2023-12-25 ENCOUNTER — Emergency Department: Payer: Medicaid Other

## 2023-12-25 ENCOUNTER — Observation Stay
Admission: EM | Admit: 2023-12-25 | Discharge: 2023-12-26 | Disposition: A | Discharge status: Home / Self Care | Payer: Medicaid Other | Attending: Internal Medicine | Admitting: Internal Medicine

## 2023-12-25 DIAGNOSIS — J4520 Mild intermittent asthma, uncomplicated: Secondary | ICD-10-CM | POA: Diagnosis not present

## 2023-12-25 DIAGNOSIS — F17210 Nicotine dependence, cigarettes, uncomplicated: Secondary | ICD-10-CM | POA: Insufficient documentation

## 2023-12-25 DIAGNOSIS — R220 Localized swelling, mass and lump, head: Secondary | ICD-10-CM | POA: Diagnosis present

## 2023-12-25 DIAGNOSIS — L03211 Cellulitis of face: Principal | ICD-10-CM | POA: Diagnosis present

## 2023-12-25 DIAGNOSIS — Z91040 Latex allergy status: Secondary | ICD-10-CM | POA: Diagnosis not present

## 2023-12-25 DIAGNOSIS — Z6831 Body mass index (BMI) 31.0-31.9, adult: Secondary | ICD-10-CM | POA: Diagnosis not present

## 2023-12-25 DIAGNOSIS — F32A Depression, unspecified: Secondary | ICD-10-CM | POA: Diagnosis present

## 2023-12-25 DIAGNOSIS — Z85828 Personal history of other malignant neoplasm of skin: Secondary | ICD-10-CM | POA: Insufficient documentation

## 2023-12-25 DIAGNOSIS — E669 Obesity, unspecified: Secondary | ICD-10-CM | POA: Diagnosis present

## 2023-12-25 DIAGNOSIS — I480 Paroxysmal atrial fibrillation: Secondary | ICD-10-CM | POA: Diagnosis not present

## 2023-12-25 DIAGNOSIS — Z79899 Other long term (current) drug therapy: Secondary | ICD-10-CM | POA: Insufficient documentation

## 2023-12-25 LAB — CBC WITH DIFFERENTIAL/PLATELET
Abs Immature Granulocytes: 0.02 10*3/uL (ref 0.00–0.07)
Basophils Absolute: 0 10*3/uL (ref 0.0–0.1)
Basophils Relative: 0 %
Eosinophils Absolute: 0.1 10*3/uL (ref 0.0–0.5)
Eosinophils Relative: 1 %
HCT: 40.9 % (ref 36.0–46.0)
Hemoglobin: 14.2 g/dL (ref 12.0–15.0)
Immature Granulocytes: 0 %
Lymphocytes Relative: 16 %
Lymphs Abs: 1.6 10*3/uL (ref 0.7–4.0)
MCH: 33.3 pg (ref 26.0–34.0)
MCHC: 34.7 g/dL (ref 30.0–36.0)
MCV: 96 fL (ref 80.0–100.0)
Monocytes Absolute: 0.5 10*3/uL (ref 0.1–1.0)
Monocytes Relative: 5 %
Neutro Abs: 7.6 10*3/uL (ref 1.7–7.7)
Neutrophils Relative %: 78 %
Platelets: 312 10*3/uL (ref 150–400)
RBC: 4.26 MIL/uL (ref 3.87–5.11)
RDW: 13.1 % (ref 11.5–15.5)
WBC: 9.9 10*3/uL (ref 4.0–10.5)
nRBC: 0 % (ref 0.0–0.2)

## 2023-12-25 LAB — BASIC METABOLIC PANEL
Anion gap: 10 (ref 5–15)
BUN: 10 mg/dL (ref 6–20)
CO2: 25 mmol/L (ref 22–32)
Calcium: 9.2 mg/dL (ref 8.9–10.3)
Chloride: 101 mmol/L (ref 98–111)
Creatinine, Ser: 0.63 mg/dL (ref 0.44–1.00)
GFR, Estimated: >60 mL/min (ref >60)
Glucose, Bld: 86 mg/dL (ref 70–99)
Potassium: 3.8 mmol/L (ref 3.5–5.1)
Sodium: 136 mmol/L (ref 135–145)

## 2023-12-25 LAB — SEDIMENTATION RATE: Sed Rate: 24 mm/h — ABNORMAL HIGH (ref 0–20)

## 2023-12-25 MED ORDER — SUMATRIPTAN SUCCINATE 50 MG PO TABS: 50.0000 mg | ORAL_TABLET | ORAL | Status: DC | PRN

## 2023-12-25 MED ORDER — SODIUM CHLORIDE 0.9 % IV BOLUS
2000.0000 mL | Freq: Once | INTRAVENOUS | Status: AC
  Administered 2023-12-25: 2000 mL via INTRAVENOUS

## 2023-12-25 MED ORDER — HYDROMORPHONE HCL 1 MG/ML IJ SOLN
1.0000 mg | INTRAMUSCULAR | Status: DC | PRN
  Administered 2023-12-25 – 2023-12-26 (×5): 1 mg via INTRAVENOUS
  Filled 2023-12-25 (×5): qty 1

## 2023-12-25 MED ORDER — DEXAMETHASONE SODIUM PHOSPHATE 10 MG/ML IJ SOLN
8.0000 mg | Freq: Two times a day (BID) | INTRAMUSCULAR | Status: DC
Start: 2023-12-25 — End: 2023-12-26
  Administered 2023-12-25 – 2023-12-26 (×2): 8 mg via INTRAVENOUS
  Filled 2023-12-25 (×2): qty 1

## 2023-12-25 MED ORDER — IOHEXOL 300 MG/ML  SOLN
75.0000 mL | Freq: Once | INTRAMUSCULAR | Status: AC | PRN
  Administered 2023-12-25: 75 mL via INTRAVENOUS

## 2023-12-25 MED ORDER — ALBUTEROL SULFATE (2.5 MG/3ML) 0.083% IN NEBU: 2.5000 mg | INHALATION_SOLUTION | RESPIRATORY_TRACT | Status: DC | PRN

## 2023-12-25 MED ORDER — OXYCODONE-ACETAMINOPHEN 5-325 MG PO TABS
1.0000 | ORAL_TABLET | ORAL | Status: DC | PRN
  Administered 2023-12-25 – 2023-12-26 (×3): 1 via ORAL
  Filled 2023-12-25 (×3): qty 1

## 2023-12-25 MED ORDER — IBUPROFEN 600 MG PO TABS
600.0000 mg | ORAL_TABLET | Freq: Once | ORAL | Status: AC
  Administered 2023-12-25: 600 mg via ORAL
  Filled 2023-12-25: qty 1

## 2023-12-25 MED ORDER — NICOTINE 21 MG/24HR TD PT24
21.0000 mg | MEDICATED_PATCH | Freq: Every day | TRANSDERMAL | Status: DC
  Filled 2023-12-25: qty 1

## 2023-12-25 MED ORDER — ONDANSETRON HCL 4 MG/2ML IJ SOLN: 4.0000 mg | Freq: Three times a day (TID) | INTRAMUSCULAR | Status: DC | PRN

## 2023-12-25 MED ORDER — SODIUM CHLORIDE 0.9 % IV SOLN
2.0000 g | Freq: Once | INTRAVENOUS | Status: AC
  Administered 2023-12-25: 2 g via INTRAVENOUS
  Filled 2023-12-25: qty 20

## 2023-12-25 MED ORDER — DM-GUAIFENESIN ER 30-600 MG PO TB12: 1.0000 | ORAL_TABLET | Freq: Two times a day (BID) | ORAL | Status: DC | PRN

## 2023-12-25 MED ORDER — FLUOXETINE HCL 20 MG PO CAPS
40.0000 mg | ORAL_CAPSULE | Freq: Every day | ORAL | Status: DC
  Administered 2023-12-26: 40 mg via ORAL
  Filled 2023-12-25: qty 2

## 2023-12-25 MED ORDER — HEPARIN SODIUM (PORCINE) 5000 UNIT/ML IJ SOLN
5000.0000 [IU] | Freq: Three times a day (TID) | INTRAMUSCULAR | Status: DC
  Administered 2023-12-25: 5000 [IU] via SUBCUTANEOUS
  Filled 2023-12-25: qty 1

## 2023-12-25 MED ORDER — ACETAMINOPHEN 325 MG PO TABS: 650.0000 mg | ORAL_TABLET | Freq: Four times a day (QID) | ORAL | Status: DC | PRN

## 2023-12-25 MED ORDER — METRONIDAZOLE 500 MG/100ML IV SOLN
500.0000 mg | Freq: Once | INTRAVENOUS | Status: AC
  Administered 2023-12-25: 500 mg via INTRAVENOUS
  Filled 2023-12-25: qty 100

## 2023-12-25 MED ORDER — ALPRAZOLAM 0.25 MG PO TABS
0.2500 mg | ORAL_TABLET | Freq: Three times a day (TID) | ORAL | Status: DC | PRN
  Administered 2023-12-25: 0.25 mg via ORAL
  Filled 2023-12-25: qty 1

## 2023-12-25 NOTE — ED Notes (Signed)
 Pt requesting pain medication, pain medicine discussed with PA, ibuprofen  given

## 2023-12-25 NOTE — ED Provider Notes (Signed)
 [facility] Provider Note    Event Date/Time   First MD Initiated Contact with Patient 12/25/23 1819     (approximate)   History   Chief Complaint: Dental Pain   HPI  Katherine Murray is a 43 y.o. female with a past history of atrial fibrillation who comes ED complaining of right lower jaw pain and swelling of the right side of the face which started yesterday morning on waking up.  Reports that the area has rapidly become more painful and swollen over the last 36 hours.  She saw her doctor yesterday, received IM Rocephin  and steroids, but reports no improvement in her symptoms so far.  She saw her dentist today who performed x-rays and instructed her to go to the ED right away.  She denies trouble breathing but endorses some difficulty with swallowing.        Physical Exam   Triage Vital Signs: ED Triage Vitals [12/25/23 1416]  Encounter Vitals Group     BP (!) 126/96     Systolic BP Percentile      Diastolic BP Percentile      Pulse Rate (!) 110     Resp 19     Temp 98.2 F (36.8 C)     Temp Source Oral     SpO2 98 %     Weight 190 lb 14.7 oz (86.6 kg)     Height 5' 5 (1.651 m)     Head Circumference      Peak Flow      Pain Score 10     Pain Loc      Pain Education      Exclude from Growth Chart     Most recent vital signs: Vitals:   12/25/23 1416 12/25/23 1828  BP: (!) 126/96 (!) 140/95  Pulse: (!) 110 94  Resp: 19 18  Temp: 98.2 F (36.8 C) 98 F (36.7 C)  SpO2: 98% 100%    General: Awake, no distress.  CV:  Good peripheral perfusion.  Regular rate and rhythm Resp:  Normal effort.  Clear lungs Abd:  No distention.  Other:  Oral exam reveals trismus.  No focal gingival swelling or purulent drainage.  Floor of mouth is soft and without tongue elevation.  There is pronounced swelling overlying the right submandibular space.  There is tenderness in the submental space without significant induration.     ED Results /  Procedures / Treatments   Labs (all labs ordered are listed, but only abnormal results are displayed) Labs Reviewed  SEDIMENTATION RATE - Abnormal; Notable for the following components:      Result Value   Sed Rate 24 (*)    All other components within normal limits  CULTURE, BLOOD (ROUTINE X 2)  CULTURE, BLOOD (ROUTINE X 2)  BASIC METABOLIC PANEL  CBC WITH DIFFERENTIAL/PLATELET  C-REACTIVE PROTEIN  HIV ANTIBODY (ROUTINE TESTING W REFLEX)  BASIC METABOLIC PANEL  CBC  POC URINE PREG, ED     EKG    RADIOLOGY CT maxillofacial interpreted by me, shows phlegmonous changes at the right mandible.  Radiology report reviewed.  No drainable abscess, not consistent with Ludwig's angina.   PROCEDURES:  Procedures   MEDICATIONS ORDERED IN ED: Medications  metroNIDAZOLE  (FLAGYL ) IVPB 500 mg (500 mg Intravenous New Bag/Given 12/25/23 2108)  HYDROmorphone  (DILAUDID ) injection 1 mg (1 mg Intravenous Given 12/25/23 1959)  oxyCODONE -acetaminophen  (PERCOCET/ROXICET) 5-325 MG per tablet 1 tablet (has no administration in time range)  acetaminophen  (  TYLENOL ) tablet 650 mg (has no administration in time range)  ondansetron  (ZOFRAN ) injection 4 mg (has no administration in time range)  albuterol  (PROVENTIL ) (2.5 MG/3ML) 0.083% nebulizer solution 2.5 mg (has no administration in time range)  dextromethorphan-guaiFENesin  (MUCINEX  DM) 30-600 MG per 12 hr tablet 1 tablet (has no administration in time range)  dexamethasone  (DECADRON ) injection 8 mg (has no administration in time range)  nicotine  (NICODERM CQ  - dosed in mg/24 hours) patch 21 mg (21 mg Transdermal Not Given 12/25/23 2000)  heparin  injection 5,000 Units (has no administration in time range)  iohexol  (OMNIPAQUE ) 300 MG/ML solution 75 mL (75 mLs Intravenous Contrast Given 12/25/23 1544)  ibuprofen  (ADVIL ) tablet 600 mg (600 mg Oral Given 12/25/23 1601)  cefTRIAXone  (ROCEPHIN ) 2 g in sodium chloride  0.9 % 100 mL IVPB (0 g Intravenous Stopped  12/25/23 2104)  sodium chloride  0.9 % bolus 2,000 mL (2,000 mLs Intravenous Bolus 12/25/23 1957)     IMPRESSION / MDM / ASSESSMENT AND PLAN / ED COURSE  I reviewed the triage vital signs and the nursing notes.  DDx: Cellulitis, cervical abscess.  Doubt PTA, RPA, or Ludwig's angina.  Patient's presentation is most consistent with acute presentation with potential threat to life or bodily function.  Patient presents with right facial swelling and tenderness.  No immunosuppression or diabetes.  CT confirms odontogenic cellulitis without drainable abscess.  No airway compromise currently, but will need to admit for IV antibiotics and observation of symptoms   ----------------------------------------- 9:10 PM on 12/25/2023 ----------------------------------------- Discussed with hospitalist      FINAL CLINICAL IMPRESSION(S) / ED DIAGNOSES   Final diagnoses:  Facial cellulitis     Rx / DC Orders   ED Discharge Orders     None        Note:  This document was prepared using Dragon voice recognition software and may include unintentional dictation errors.   Viviann Pastor, MD 12/25/23 2110

## 2023-12-25 NOTE — ED Provider Triage Note (Signed)
 Emergency Medicine Provider Triage Evaluation Note  Katherine Murray , a 43 y.o. female  was evaluated in triage.  Pt complains of dental abscess, sent over by her dentist for concerns about her airway. Dentist thought she needed to see oral surgery and start iv abx.  Review of Systems  Positive: Facial pain, difficulty swallowing Negative: fever  Physical Exam  There were no vitals taken for this visit. Gen:   Awake, no distress   Resp:  Normal effort  MSK:   Moves extremities without difficulty  Other:  Right sided facial swelling, tenderness to palpation  Medical Decision Making  Medically screening exam initiated at 2:14 PM.  Appropriate orders placed.  Katherine Murray was informed that the remainder of the evaluation will be completed by another provider, this initial triage assessment does not replace that evaluation, and the importance of remaining in the ED until their evaluation is complete.   Cleaster Tinnie LABOR, PA-C 12/25/23 1420

## 2023-12-25 NOTE — ED Notes (Signed)
 Pt contact made and myself introduced. Pt is CAOx4, breathing normally, and normal in color. Pt is complaining of 9/10 right jaw pain at this time and is otherwise without complaint.

## 2023-12-25 NOTE — H&P (Signed)
 History and Physical    Katherine Murray FMW:[id number] DOB: 1981/09/22 DOA: 12/25/2023  Referring MD/NP/PA:   PCP: Corwin Antu, FNP   Patient coming from:  The patient is coming from home.     Chief Complaint: right facial swelling and pain  HPI: Katherine Murray is a 43 y.o. female with medical history significant of asthma, depression, obesity, HLD, migraine,  PAF not on AC, who presents with right facial swelling and pain.  Patient states that she started having pain and swelling in the right lower face since yesterday morning. She reports that the area has rapidly become more painful and swollen.  Pain is constant, aching, severe, radiating to the neck, aggravated by eating.  Denies fever or chills. Patient has mild SOB, no cough, chest pain.  No nausea, vomiting, diarrhea or abdominal pain.  No symptoms of UTI.  Patient was seen by her PCP today, and gave her IM injection of Rocephin  and steroid in the morning, but no improvement. Pt was also seen by dentist today, who did x-ray for her. It showed infection of last molar in right lower jaw. Pt was instructed to go to the ED right away.  Data reviewed independently and ED Course: pt was found to have WBC 9.9, GFR> 60, temperature 98, blood pressure 140/95, heart rate 110 --> 94, RR 19, oxygen saturation 100% on room air. Pt is placed on telemetry bed for observation.  CT of maxillofacial imaging: 1. Odontogenic cellulitis and phlegmonous change along the right mandibular body and the mandibular symphysis. No discrete drainable fluid collection visualized at this time. 2. Air-fluid level in the left maxillary sinus, which can be seen in the setting of acute sinusitis.    EKG: I have personally reviewed.  Sinus rhythm, QTc 476, early R wave progression.   Review of Systems:   General: no fevers, chills, no body weight gain, has poor appetite, has fatigue HEENT: no blurry vision, hearing changes or sore throat. Has swelling and  pain in right lower face Respiratory: has mild dyspnea, no coughing, wheezing CV: no chest pain, no palpitations GI: no nausea, vomiting, abdominal pain, diarrhea, constipation GU: no dysuria, burning on urination, increased urinary frequency, hematuria  Ext: no leg edema Neuro: no unilateral weakness, numbness, or tingling, no vision change or hearing loss Skin: no rash, no skin tear. MSK: No muscle spasm, no deformity, no limitation of range of movement in spin Heme: No easy bruising.  Travel history: No recent long distant travel.   Allergy:  Allergies  Allergen Reactions   Lyrica [Pregabalin] Swelling   Egg-Derived Products Rash and Swelling   Latex Rash    Past Medical History:  Diagnosis Date   A-fib (HCC)    Asthma    Cancer (HCC)    skin   Diverticulosis    Gallstones    Headache    Heart rate fast    Kidney stones    Lower abdominal pain    Right   Mitral regurgitation    Mild   Ovarian cyst 09/16/2016   right measuring 4.4 cm   Pneumonia    Tachycardia 08/01/2016   Tricuspid regurgitation    Trace   Vaginal infection     Past Surgical History:  Procedure Laterality Date   CESAREAN SECTION     1, 2012   CHOLECYSTECTOMY     2012   LAPAROSCOPIC OVARIAN CYSTECTOMY Right 09/24/2016   Procedure: LAPAROSCOPIC RIGHT OVARIAN CYSTECTOMY, Excision of ovarian cyst, Excision of endometeriosis,;  Surgeon: Gladis DELENA Dollar, MD;  Location: [facility] ORS;  Service: Gynecology;  Laterality: Right;   TONSILLECTOMY     2008    Social History:  reports that she has been smoking cigarettes. She has a 0.7 pack-year smoking history. She has never used smokeless tobacco. She reports that she does not currently use alcohol. She reports that she does not use drugs.  Family History:  Family History  Problem Relation Age of Onset   Diabetes Mother    Hypertension Mother    Hyperlipidemia Mother    Diabetes Father    Hypertension Father    Hyperlipidemia Father     Ovarian cancer Maternal Grandmother    Ulcerative colitis Maternal Grandmother    Stomach cancer Maternal Grandfather    Colon cancer Paternal Grandmother    Diabetes Paternal Grandmother    Kidney disease Paternal Grandfather    Breast cancer Neg Hx      Prior to Admission medications   Medication Sig Start Date End Date Taking? Authorizing Provider  albuterol  (VENTOLIN  HFA) 108 (90 Base) MCG/ACT inhaler INHALE 2 PUFFS INTO THE LUNGS EVERY 4 HOURS AS NEEDED FOR WHEEZING OR SHORTNESS OF BREATH. 07/03/22   Corwin Antu, FNP  escitalopram  (LEXAPRO ) 20 MG tablet Take 1 tablet (20 mg total) by mouth daily. 09/25/22 12/24/22  Dugal, Tabitha, FNP  levonorgestrel  (MIRENA ) 20 MCG/DAY IUD 1 each by Intrauterine route once.    [provider]  ondansetron  (ZOFRAN -ODT) 4 MG disintegrating tablet Take 1 tablet (4 mg total) by mouth every 8 (eight) hours as needed for nausea or vomiting. 09/29/23   Saunders Shona CROME, PA-C  SUMAtriptan  (IMITREX ) 50 MG tablet TAKE ONE TABLET BY MOUTH X 1 , MAY REPEAT DOSE X 1 AFTER 2 HOURS IF STILL WITH SYMPTOMS 07/03/22   Corwin Antu, FNP    Physical Exam: Vitals:   12/25/23 2030 12/25/23 2219 12/25/23 2334 12/25/23 2338  BP: (!) 138/95  (!) 130/93   Pulse: 90 91 (!) 101   Resp: 15 16 19    Temp:    97.8 F (36.6 C)  TempSrc:    Axillary  SpO2: 99% 100% 100%   Weight:      Height:       General: Not in acute distress HEENT: has erythema, swelling, warmth and tenderness in right lower face       Eyes: PERRL, EOMI, no jaundice       ENT: No discharge from the ears and nose, no pharynx injection, no tonsillar enlargement.        Neck: No JVD, no bruit, no mass felt. Heme: No neck lymph node enlargement. Cardiac: S1/S2, RRR, No murmurs, No gallops or rubs. Respiratory: No rales, wheezing, rhonchi or rubs. GI: Soft, nondistended, nontender, no rebound pain, no organomegaly, BS present. GU: No hematuria Ext: No pitting leg edema bilaterally. 1+DP/PT  pulse bilaterally. Musculoskeletal: No joint deformities, No joint redness or warmth, no limitation of ROM in spin. Skin: No rashes.  Neuro: Alert, oriented X3, cranial nerves II-XII grossly intact, moves all extremities normally.Psych: Patient is not psychotic, no suicidal or hemocidal ideation.  Labs on Admission: I have personally reviewed following labs and imaging studies  CBC: Recent Labs  Lab 12/25/23 1420  WBC 9.9  NEUTROABS 7.6  HGB 14.2  HCT 40.9  MCV 96.0  PLT 312   Basic Metabolic Panel: Recent Labs  Lab 12/25/23 1420  NA 136  K 3.8  CL 101  CO2 25  GLUCOSE 86  BUN 10  CREATININE 0.63  CALCIUM 9.2   GFR: Estimated Creatinine Clearance: 98.5 mL/min (by C-G formula based on SCr of 0.63 mg/dL). Liver Function Tests: No results for input(s): AST, ALT, ALKPHOS, BILITOT, PROT, ALBUMIN in the last 168 hours. No results for input(s): LIPASE, AMYLASE in the last 168 hours. No results for input(s): AMMONIA in the last 168 hours. Coagulation Profile: No results for input(s): INR, PROTIME in the last 168 hours. Cardiac Enzymes: No results for input(s): CKTOTAL, CKMB, CKMBINDEX, TROPONINI in the last 168 hours. BNP (last 3 results) No results for input(s): PROBNP in the last 8760 hours. HbA1C: No results for input(s): HGBA1C in the last 72 hours. CBG: No results for input(s): GLUCAP in the last 168 hours. Lipid Profile: No results for input(s): CHOL, HDL, LDLCALC, TRIG, CHOLHDL, LDLDIRECT in the last 72 hours. Thyroid  Function Tests: No results for input(s): TSH, T4TOTAL, FREET4, T3FREE, THYROIDAB in the last 72 hours. Anemia Panel: No results for input(s): VITAMINB12, FOLATE, FERRITIN, TIBC, IRON, RETICCTPCT in the last 72 hours. Urine analysis:    Component Value Date/Time   COLORURINE YELLOW (A) 08/09/2018 1817   APPEARANCEUR HAZY (A) 08/09/2018 1817   APPEARANCEUR Hazy 01/19/2013 0757    LABSPEC 1.020 08/09/2018 1817   LABSPEC 1.017 01/19/2013 0757   PHURINE 6.0 08/09/2018 1817   GLUCOSEU NEGATIVE 08/09/2018 1817   GLUCOSEU Negative 01/19/2013 0757   HGBUR MODERATE (A) 08/09/2018 1817   BILIRUBINUR NEGATIVE 08/09/2018 1817   BILIRUBINUR Negative 01/19/2013 0757   KETONESUR NEGATIVE 08/09/2018 1817   PROTEINUR NEGATIVE 08/09/2018 1817   UROBILINOGEN 0.2 04/04/2014 1424   NITRITE POSITIVE (A) 08/09/2018 1817   LEUKOCYTESUR NEGATIVE 08/09/2018 1817   LEUKOCYTESUR Negative 01/19/2013 0757   Sepsis Labs: @LABRCNTIP (procalcitonin:4,lacticidven:4) )No results found for this or any previous visit (from the past 240 hours).   Radiological Exams on Admission:   Assessment/Plan Principal Problem:   Facial cellulitis Active Problems:   Mild intermittent asthma without complication   PAF (paroxysmal atrial fibrillation) (HCC)   Depression   Obesity (BMI 30-39.9)    Assessment and Plan:  Facial cellulitis_right: CT showed odontogenic cellulitis and phlegmonous change along the right mandibular body and the mandibular symphysis. No discrete drainable fluid collection visualized at this time. Pt does not meet criteria for sepsis (WBC 9.9, temperature 98, RR 19, heart rate 110).  - will place on tele bed  for obs -Patient was started on Rocephin  and Flagyl  in ED, will continue both - PRN Zofran  for nausea,  Tylenol , Dilaudidand Percocet for pain - Blood cultures x 2  - ESR and CRP - IVF: 2L of NS bolus -Decadron  8mg  twice daily  Mild intermittent asthma without complication: stable -As needed albuterol  and Mucinex   PAF (paroxysmal atrial fibrillation) (HCC): CHADS2 score 1.  Patient not taking anticoagulants.  Heart rate 110 --> 90s -tele monitoring  Depression and anxiety -Zoloft and alprazolam   Obesity (BMI 30-39.9): Body weight 86.6 kg, BMI 31.77 -Encourage to lose some weight -Exercise and healthy diet -Continue home phentermine     DVT ppx: SQ  Heparin      Code Status: Full code   Family Communication:     not done, no family member is at bed side.     Disposition Plan:  Anticipate discharge back to previous environment  Consults called:  none  Admission status and Level of care: Telemetry Medical:    for obs as inpt        Dispo: The patient is from: Home  Anticipated d/c is to: Home              Anticipated d/c date is: 1 day              Patient currently is not medically stable to d/c.    Severity of Illness:  The appropriate patient status for this patient is OBSERVATION. Observation status is judged to be reasonable and necessary in order to provide the required intensity of service to ensure the patient's safety. The patient's presenting symptoms, physical exam findings, and initial radiographic and laboratory data in the context of their medical condition is felt to place them at decreased risk for further clinical deterioration. Furthermore, it is anticipated that the patient will be medically stable for discharge from the hospital within 2 midnights of admission.        Date of Service 12/26/2023    Caleb Exon [facility]   If 7PM-7AM, please contact night-coverage [email or web address] 12/26/2023, 1:37 AM

## 2023-12-25 NOTE — ED Triage Notes (Signed)
Pt here with dental pain, right side of face swollen since yesterday. Pt was sent to ED due to her having trouble swallowing. Pt states she felt like she had a fever, pt has dental caries on the bottom right of her mouth.

## 2023-12-26 DIAGNOSIS — E669 Obesity, unspecified: Secondary | ICD-10-CM | POA: Diagnosis not present

## 2023-12-26 DIAGNOSIS — L03211 Cellulitis of face: Secondary | ICD-10-CM | POA: Diagnosis not present

## 2023-12-26 DIAGNOSIS — I480 Paroxysmal atrial fibrillation: Secondary | ICD-10-CM | POA: Diagnosis not present

## 2023-12-26 DIAGNOSIS — J4520 Mild intermittent asthma, uncomplicated: Secondary | ICD-10-CM | POA: Diagnosis not present

## 2023-12-26 DIAGNOSIS — F32A Depression, unspecified: Secondary | ICD-10-CM

## 2023-12-26 LAB — BASIC METABOLIC PANEL
Anion gap: 10 (ref 5–15)
BUN: 6 mg/dL (ref 6–20)
CO2: 20 mmol/L — ABNORMAL LOW (ref 22–32)
Calcium: 7.9 mg/dL — ABNORMAL LOW (ref 8.9–10.3)
Chloride: 106 mmol/L (ref 98–111)
Creatinine, Ser: 0.56 mg/dL (ref 0.44–1.00)
GFR, Estimated: >60 mL/min (ref >60)
Glucose, Bld: 110 mg/dL — ABNORMAL HIGH (ref 70–99)
Potassium: 4 mmol/L (ref 3.5–5.1)
Sodium: 136 mmol/L (ref 135–145)

## 2023-12-26 LAB — CBC
HCT: 38.3 % (ref 36.0–46.0)
Hemoglobin: 13.3 g/dL (ref 12.0–15.0)
MCH: 33.8 pg (ref 26.0–34.0)
MCHC: 34.7 g/dL (ref 30.0–36.0)
MCV: 97.5 fL (ref 80.0–100.0)
Platelets: 271 10*3/uL (ref 150–400)
RBC: 3.93 MIL/uL (ref 3.87–5.11)
RDW: 12.9 % (ref 11.5–15.5)
WBC: 7.5 10*3/uL (ref 4.0–10.5)
nRBC: 0 % (ref 0.0–0.2)

## 2023-12-26 LAB — C-REACTIVE PROTEIN: CRP: 3 mg/dL — ABNORMAL HIGH (ref <1.0)

## 2023-12-26 LAB — HIV ANTIBODY (ROUTINE TESTING W REFLEX): HIV Screen 4th Generation wRfx: NONREACTIVE

## 2023-12-26 MED ORDER — SODIUM CHLORIDE 0.9 % IV SOLN: 2.0000 g | INTRAVENOUS | Status: DC

## 2023-12-26 MED ORDER — METRONIDAZOLE 500 MG/100ML IV SOLN
500.0000 mg | Freq: Two times a day (BID) | INTRAVENOUS | Status: DC
  Administered 2023-12-26: 500 mg via INTRAVENOUS
  Filled 2023-12-26: qty 100

## 2023-12-26 MED ORDER — AMOXICILLIN-POT CLAVULANATE 875-125 MG PO TABS: 1.0000 | ORAL_TABLET | Freq: Two times a day (BID) | ORAL | 0 refills | Status: AC

## 2023-12-26 MED ORDER — CYANOCOBALAMIN 1000 MCG/ML IJ SOLN: 1000.0000 ug | INTRAMUSCULAR | Status: DC

## 2023-12-26 MED ORDER — SODIUM CHLORIDE 0.9 % IV SOLN
1.0000 g | INTRAVENOUS | Status: DC
  Administered 2023-12-26: 1 g via INTRAVENOUS
  Filled 2023-12-26: qty 10

## 2023-12-26 NOTE — Discharge Summary (Signed)
 Physician Discharge Summary   Patient: Katherine Murray MRN: [id number] DOB: [birth year]  Admit date:     12/25/2023  Discharge date: 12/26/23  Discharge Physician: Amaryllis Dare   PCP: Corwin Antu, FNP   Recommendations at discharge:  Please obtain CBC and BMP and follow-up Please try obtaining bone biopsy during tooth extraction to rule out any underlying osteomyelitis of mandible. Patient is being discharged on 2 weeks of Augmentin , she will need more prolonged course if bone biopsy is positive. Follow-up with dentist Follow-up with oral surgeon as recommended by her dentist Follow-up with primary care provider  Discharge Diagnoses: Principal Problem:   Facial cellulitis Active Problems:   Mild intermittent asthma without complication   PAF (paroxysmal atrial fibrillation) (HCC)   Depression   Obesity (BMI 30-39.9)   Hospital Course: Taken from H&P.  Katherine Murray is a 43 y.o. female with medical history significant of asthma, depression, obesity, HLD, migraine,  PAF not on AC, who presents with right facial swelling and pain.  Patient was seen by her PCP today, and gave her IM injection of Rocephin  and steroid in the morning, but no improvement. Pt was also seen by dentist today, who did x-ray for her. It showed infection of last molar in right lower jaw. Pt was instructed to go to the ED.  On presentation patient was hemodynamically stable. CT of maxillofacial shows odontogenic cellulitis and phlegmonous change along the right mandibular body and the mandibular symphysis, no discrete drainable fluid collection noted.  Also shows an air-fluid level in the left maxillary sinus, likely due to acute sinusitis.  Patient was started on Rocephin  and Flagyl .  2/6: Vital stable, labs with mild metabolic acidosis, CRP elevated at 3, ESR 24. Preliminary blood cultures negative in 12-hour. Significant improvement in erythema, still have some right jaw edema.  Much controlled pain.   Her dentist apparently told her to follow-up with oral surgeon, patient was told to follow-up as soon as possible as she will need that tooth extraction.  Her oral surgeon should be able to perform a bone biopsy during the procedure to rule out any underlying osteomyelitis of mandible.  Patient does not have any systemic symptoms at this time, able to eat and drink. She is being discharged on 2 weeks of Augmentin , might need prolonged antibiotic up to 6 weeks if bone biopsy which need to be done by oral surgery came back positive for osteomyelitis.  Patient will continue with rest of her home medications and need to have a close follow-up with her providers for further management.   Pain control - [place]  Controlled Substance Reporting System database was reviewed. and patient was instructed, not to drive, operate heavy machinery, perform activities at heights, swimming or participation in water activities or provide baby-sitting services while on Pain, Sleep and Anxiety Medications; until their outpatient Physician has advised to do so again. Also recommended to not to take more than prescribed Pain, Sleep and Anxiety Medications.  Consultants: None Procedures performed: None Disposition: Home Diet recommendation:  Discharge Diet Orders (From admission, onward)     Start     Ordered   12/26/23 0000  Diet - low sodium heart healthy        12/26/23 1254           Regular diet DISCHARGE MEDICATION: Allergies as of 12/26/2023       Reactions   Lyrica [pregabalin] Swelling   Egg-derived Products Rash, Swelling   Latex Rash  Medication List     STOP taking these medications    ondansetron  4 MG disintegrating tablet Commonly known as: ZOFRAN -ODT       TAKE these medications    acetaminophen  325 MG tablet Commonly known as: TYLENOL  Take 650 mg by mouth every 6 (six) hours as needed.   Advil  Liqui-Gels minis 200 MG Caps Generic drug: Ibuprofen  Take 400 mg by  mouth 2 (two) times daily as needed.   albuterol  108 (90 Base) MCG/ACT inhaler Commonly known as: VENTOLIN  HFA INHALE 2 PUFFS INTO THE LUNGS EVERY 4 HOURS AS NEEDED FOR WHEEZING OR SHORTNESS OF BREATH.   ALPRAZolam  0.25 MG tablet Commonly known as: XANAX  Take 0.25 mg by mouth 3 (three) times daily as needed for anxiety. Max dose is 3 tablets (0.75 mg) in 24 hours.   amoxicillin -clavulanate 875-125 MG tablet Commonly known as: AUGMENTIN  Take 1 tablet by mouth 2 (two) times daily for 14 days.   cyanocobalamin  1000 MCG/ML injection Commonly known as: VITAMIN B12 Inject 1,000 mcg into the muscle every 30 (thirty) days.   escitalopram  20 MG tablet Commonly known as: Lexapro  Take 1 tablet (20 mg total) by mouth daily.   FLUoxetine  20 MG capsule Commonly known as: PROZAC  Take 40 mg by mouth daily.   HYDROcodone -acetaminophen  5-325 MG tablet Commonly known as: NORCO/VICODIN Take 1 tablet by mouth every 6 (six) hours as needed.   levonorgestrel  20 MCG/DAY Iud Commonly known as: MIRENA  1 each by Intrauterine route once.   ondansetron  4 MG tablet Commonly known as: ZOFRAN  Take 4 mg by mouth daily as needed.   phentermine 37.5 MG capsule Take 37.5 mg by mouth every morning.   SUMAtriptan  50 MG tablet Commonly known as: IMITREX  TAKE ONE TABLET BY MOUTH X 1 , MAY REPEAT DOSE X 1 AFTER 2 HOURS IF STILL WITH SYMPTOMS   Trudhesa 0.725 MG/ACT Aers Generic drug: Dihydroergotamine Mesylate HFA Place 0.725 mg into the nose every other day as needed.   Wegovy  1.7 MG/0.75ML Soaj Generic drug: Semaglutide -Weight Management Inject 1.7 mg into the skin once a week.        Discharge Exam: Filed Weights   12/25/23 1416  Weight: 86.6 kg   General.  Well-developed lady, in no acute distress.  Mild edema of right jaw. Pulmonary.  Lungs clear bilaterally, normal respiratory effort. CV.  Regular rate and rhythm, no JVD, rub or murmur. Abdomen.  Soft, nontender, nondistended, BS  positive. CNS.  Alert and oriented .  No focal neurologic deficit. Extremities.  No edema, pulses intact and symmetrical. Psychiatry.  Judgment and insight appears normal.   Condition at discharge: stable  The results of significant diagnostics from this hospitalization (including imaging, microbiology, ancillary and laboratory) are listed below for reference.   Imaging Studies: CT Maxillofacial W Contrast Result Date: 12/25/2023 CLINICAL DATA:  Sublingual/submandibular abscess dental abscess EXAM: CT MAXILLOFACIAL WITH CONTRAST TECHNIQUE: Multidetector CT imaging of the maxillofacial structures was performed with intravenous contrast. Multiplanar CT image reconstructions were also generated. RADIATION DOSE REDUCTION: This exam was performed according to the departmental dose-optimization program which includes automated exposure control, adjustment of the mA and/or kV according to patient size and/or use of iterative reconstruction technique. CONTRAST:  75mL OMNIPAQUE  IOHEXOL  300 MG/ML  SOLN COMPARISON:  None Available. FINDINGS: Osseous: No fracture or mandibular dislocation. There is odontogenic disease with multiple dental caries and periapical lucency along the right mandibular arch. Orbits: Negative. No traumatic or inflammatory finding. Sinuses: No middle ear or mastoid effusion there are postsurgical  changes from a wall down left-sided mastoidectomy. Mucosal thickening and an air-fluid level in the left maxillary sinus, which can be seen in the setting of acute sinusitis. Soft tissues: There is subcutaneous soft tissue stranding along the right aspect face, predominantly surrounding the right mandibular body. There is phlegmonous change abutting the mandibular body in the mandibular symphysis (series 2, image 25) without a discrete fluid collection. Limited intracranial: No significant or unexpected finding. IMPRESSION: 1. Odontogenic cellulitis and phlegmonous change along the right mandibular  body and the mandibular symphysis. No discrete drainable fluid collection visualized at this time. 2. Air-fluid level in the left maxillary sinus, which can be seen in the setting of acute sinusitis. Electronically Signed   By: Lyndall Gore M.D.   On: 12/25/2023 16:02    Microbiology: Results for orders placed or performed during the hospital encounter of 12/25/23  Culture, blood (Routine X 2) w Reflex to ID Panel     Status: None (Preliminary result)   Collection Time: 12/25/23  8:30 PM   Specimen: BLOOD  Result Value Ref Range Status   Specimen Description BLOOD LEFT ANTECUBITAL  Final   Special Requests   Final    BOTTLES DRAWN AEROBIC AND ANAEROBIC Blood Culture adequate volume   Culture   Final    NO GROWTH < 12 HOURS Performed at [facility], [address], [place], [place] [address]    Report Status PENDING  Incomplete  Culture, blood (Routine X 2) w Reflex to ID Panel     Status: None (Preliminary result)   Collection Time: 12/25/23  8:34 PM   Specimen: BLOOD  Result Value Ref Range Status   Specimen Description BLOOD RIGHT ANTECUBITAL  Final   Special Requests   Final    BOTTLES DRAWN AEROBIC AND ANAEROBIC Blood Culture adequate volume   Culture   Final    NO GROWTH < 12 HOURS Performed at [facility], [address] [place], [place] [address]    Report Status PENDING  Incomplete    Labs: CBC: Recent Labs  Lab 12/25/23 1420 12/26/23 0253  WBC 9.9 7.5  NEUTROABS 7.6  --   HGB 14.2 13.3  HCT 40.9 38.3  MCV 96.0 97.5  PLT 312 271   Basic Metabolic Panel: Recent Labs  Lab 12/25/23 1420 12/26/23 0253  NA 136 136  K 3.8 4.0  CL 101 106  CO2 25 20*  GLUCOSE 86 110*  BUN 10 6  CREATININE 0.63 0.56  CALCIUM 9.2 7.9*   Liver Function Tests: No results for input(s): AST, ALT, ALKPHOS, BILITOT, PROT, ALBUMIN in the last 168 hours. CBG: No results for input(s): GLUCAP in the last 168 hours.  Discharge time spent:  greater than 30 minutes.  This record has been created using Conservation officer, historic buildings. Errors have been sought and corrected,but may not always be located. Such creation errors do not reflect on the standard of care.   Signed: Amaryllis Dare, MD [facility] 12/26/2023

## 2023-12-26 NOTE — Hospital Course (Addendum)
 Taken from H&P.  Katherine Murray is a 43 y.o. female with medical history significant of asthma, depression, obesity, HLD, migraine,  PAF not on AC, who presents with right facial swelling and pain.  Patient was seen by her PCP today, and gave her IM injection of Rocephin  and steroid in the morning, but no improvement. Pt was also seen by dentist today, who did x-ray for her. It showed infection of last molar in right lower jaw. Pt was instructed to go to the ED.  On presentation patient was hemodynamically stable. CT of maxillofacial shows odontogenic cellulitis and phlegmonous change along the right mandibular body and the mandibular symphysis, no discrete drainable fluid collection noted.  Also shows an air-fluid level in the left maxillary sinus, likely due to acute sinusitis.  Patient was started on Rocephin  and Flagyl .  2/6: Vital stable, labs with mild metabolic acidosis, CRP elevated at 3, ESR 24. Preliminary blood cultures negative in 12-hour. Significant improvement in erythema, still have some right jaw edema.  Much controlled pain.  Her dentist apparently told her to follow-up with oral surgeon, patient was told to follow-up as soon as possible as she will need that tooth extraction.  Her oral surgeon should be able to perform a bone biopsy during the procedure to rule out any underlying osteomyelitis of mandible.  Patient does not have any systemic symptoms at this time, able to eat and drink. She is being discharged on 2 weeks of Augmentin , might need prolonged antibiotic up to 6 weeks if bone biopsy which need to be done by oral surgery came back positive for osteomyelitis.  Patient will continue with rest of her home medications and need to have a close follow-up with her providers for further management.

## 2023-12-30 LAB — CULTURE, BLOOD (ROUTINE X 2)
Culture: NO GROWTH
Culture: NO GROWTH
Special Requests: ADEQUATE
Special Requests: ADEQUATE

## 2024-03-08 ENCOUNTER — Emergency Department
Admission: EM | Admit: 2024-03-08 | Discharge: 2024-03-09 | Disposition: A | Discharge status: Home / Self Care | Attending: Emergency Medicine | Admitting: Emergency Medicine

## 2024-03-08 ENCOUNTER — Encounter: Payer: Self-pay | Admitting: Emergency Medicine

## 2024-03-08 ENCOUNTER — Emergency Department

## 2024-03-08 ENCOUNTER — Other Ambulatory Visit: Payer: Self-pay

## 2024-03-08 DIAGNOSIS — R7309 Other abnormal glucose: Secondary | ICD-10-CM | POA: Insufficient documentation

## 2024-03-08 DIAGNOSIS — T50902A Poisoning by unspecified drugs, medicaments and biological substances, intentional self-harm, initial encounter: Secondary | ICD-10-CM | POA: Insufficient documentation

## 2024-03-08 DIAGNOSIS — F419 Anxiety disorder, unspecified: Secondary | ICD-10-CM | POA: Diagnosis not present

## 2024-03-08 LAB — ACETAMINOPHEN LEVEL: Acetaminophen (Tylenol), Serum: <10 ug/mL — ABNORMAL LOW (ref 10–30)

## 2024-03-08 LAB — CBC WITH DIFFERENTIAL/PLATELET
Abs Immature Granulocytes: 0.05 10*3/uL (ref 0.00–0.07)
Basophils Absolute: 0.1 10*3/uL (ref 0.0–0.1)
Basophils Relative: 0 %
Eosinophils Absolute: 0.1 10*3/uL (ref 0.0–0.5)
Eosinophils Relative: 1 %
HCT: 36 % (ref 36.0–46.0)
Hemoglobin: 12.5 g/dL (ref 12.0–15.0)
Immature Granulocytes: 0 %
Lymphocytes Relative: 44 %
Lymphs Abs: 5.4 10*3/uL — ABNORMAL HIGH (ref 0.7–4.0)
MCH: 34.1 pg — ABNORMAL HIGH (ref 26.0–34.0)
MCHC: 34.7 g/dL (ref 30.0–36.0)
MCV: 98.1 fL (ref 80.0–100.0)
Monocytes Absolute: 0.5 10*3/uL (ref 0.1–1.0)
Monocytes Relative: 4 %
Neutro Abs: 6.1 10*3/uL (ref 1.7–7.7)
Neutrophils Relative %: 51 %
Platelets: 337 10*3/uL (ref 150–400)
RBC: 3.67 MIL/uL — ABNORMAL LOW (ref 3.87–5.11)
RDW: 12.7 % (ref 11.5–15.5)
WBC: 12.1 10*3/uL — ABNORMAL HIGH (ref 4.0–10.5)
nRBC: 0 % (ref 0.0–0.2)

## 2024-03-08 LAB — COMPREHENSIVE METABOLIC PANEL WITH GFR
ALT: 29 U/L (ref 0–44)
AST: 34 U/L (ref 15–41)
Albumin: 3.9 g/dL (ref 3.5–5.0)
Alkaline Phosphatase: 32 U/L — ABNORMAL LOW (ref 38–126)
Anion gap: 9 (ref 5–15)
BUN: 14 mg/dL (ref 6–20)
CO2: 25 mmol/L (ref 22–32)
Calcium: 9.2 mg/dL (ref 8.9–10.3)
Chloride: 101 mmol/L (ref 98–111)
Creatinine, Ser: 0.91 mg/dL (ref 0.44–1.00)
GFR, Estimated: >60 mL/min (ref >60)
Glucose, Bld: 177 mg/dL — ABNORMAL HIGH (ref 70–99)
Potassium: 3.5 mmol/L (ref 3.5–5.1)
Sodium: 135 mmol/L (ref 135–145)
Total Bilirubin: 0.6 mg/dL (ref 0.0–1.2)
Total Protein: 6.5 g/dL (ref 6.5–8.1)

## 2024-03-08 LAB — SALICYLATE LEVEL: Salicylate Lvl: <7 mg/dL — ABNORMAL LOW (ref 7.0–30.0)

## 2024-03-08 LAB — CBG MONITORING, ED: Glucose-Capillary: 184 mg/dL — ABNORMAL HIGH (ref 70–99)

## 2024-03-08 LAB — POC URINE PREG, ED: Preg Test, Ur: NEGATIVE

## 2024-03-08 LAB — ETHANOL: Alcohol, Ethyl (B): <10 mg/dL (ref <10)

## 2024-03-08 LAB — MAGNESIUM: Magnesium: 2.2 mg/dL (ref 1.7–2.4)

## 2024-03-08 NOTE — ED Provider Notes (Signed)
 [facility] Provider Note    Event Date/Time   First MD Initiated Contact with Patient 03/08/24 2144     (approximate)   History   Drug Overdose   HPI  Katherine Murray is a 43 year old female presenting to the ER for evaluation after an overdose.  Patient unable to provide history.  Per report she was found with bottles of Xanax , tizanidine, and benadryl.  Received 2 mg of Narcan without significant response.  No recent psychiatric admissions, though I do see a history of anxiety from recent office visit.     Physical Exam   Triage Vital Signs: ED Triage Vitals  Encounter Vitals Group     BP 03/08/24 2138 (!) 161/99     Systolic BP Percentile --      Diastolic BP Percentile --      Pulse Rate 03/08/24 2138 67     Resp 03/08/24 2138 15     Temp 03/08/24 2138 98.1 F (36.7 C)     Temp Source 03/08/24 2138 Oral     SpO2 03/08/24 2138 99 %     Weight 03/08/24 2137 193 lb 2 oz (87.6 kg)     Height 03/08/24 2137 5\' 5"  (1.651 m)     Head Circumference --      Peak Flow --      Pain Score --      Pain Loc --      Pain Education --      Exclude from Growth Chart --     Most recent vital signs: Vitals:   03/08/24 2245 03/08/24 2300  BP:  114/82  Pulse: 67 69  Resp: 14 16  Temp:    SpO2: 97% 98%     General: Unresponsive, moans to painful stimuli Eyes:  Pupils 4 mm and reactive CV:  Regular rate, good peripheral perfusion.  Resp:  Unlabored respirations, lungs clear to auscultation Abd:  Nondistended, soft Neuro:  No gross facial asymmetry, withdraws to repeated painful stimuli, eyes closed, moaning, says a few difficult to discern words  ED Results / Procedures / Treatments   Labs (all labs ordered are listed, but only abnormal results are displayed) Labs Reviewed  COMPREHENSIVE METABOLIC PANEL WITH GFR - Abnormal; Notable for the following components:      Result Value   Glucose, Bld 177 (*)    Alkaline Phosphatase 32 (*)    All  other components within normal limits  SALICYLATE LEVEL - Abnormal; Notable for the following components:   Salicylate Lvl <7.0 (*)    All other components within normal limits  ACETAMINOPHEN  LEVEL - Abnormal; Notable for the following components:   Acetaminophen  (Tylenol ), Serum <10 (*)    All other components within normal limits  CBC WITH DIFFERENTIAL/PLATELET - Abnormal; Notable for the following components:   WBC 12.1 (*)    RBC 3.67 (*)    MCH 34.1 (*)    Lymphs Abs 5.4 (*)    All other components within normal limits  CBG MONITORING, ED - Abnormal; Notable for the following components:   Glucose-Capillary 184 (*)    All other components within normal limits  ETHANOL  MAGNESIUM  URINE DRUG SCREEN, QUALITATIVE ([facility] ONLY)  HCG, QUANTITATIVE, PREGNANCY  POC URINE PREG, ED     EKG EKG independently reviewed interpreted by myself (ER attending) demonstrates:  EKG demonstrates sinus rhythm rate of 67, PR 161, QRS 84, QTc 487, no acute ST changes  RADIOLOGY Imaging independently reviewed and  interpreted by myself demonstrates:  CT head without acute bleed  Formal Radiology Read:  CT Head Wo Contrast Result Date: 03/08/2024 CLINICAL DATA:  Mental status change not responsive EXAM: CT HEAD WITHOUT CONTRAST TECHNIQUE: Contiguous axial images were obtained from the base of the skull through the vertex without intravenous contrast. RADIATION DOSE REDUCTION: This exam was performed according to the departmental dose-optimization program which includes automated exposure control, adjustment of the mA and/or kV according to patient size and/or use of iterative reconstruction technique. COMPARISON:  MRI 07/07/2018 FINDINGS: Brain: No evidence of acute infarction, hemorrhage, hydrocephalus, extra-axial collection or mass lesion/mass effect. Cerebellar tonsils extend by greater than 9 mm below the level of foramen magnum. Vascular: No hyperdense vessel or unexpected calcification. Skull:  Normal. Negative for fracture or focal lesion. Sinuses/Orbits: No acute finding. Other: None IMPRESSION: 1. No CT evidence for acute intracranial abnormality. 2. Chiari I malformation. Electronically Signed   By: Esmeralda Hedge M.D.   On: 03/08/2024 23:15    PROCEDURES:  Critical Care performed: No  Procedures   MEDICATIONS ORDERED IN ED: Medications - No data to display   IMPRESSION / MDM / ASSESSMENT AND PLAN / ED COURSE  I reviewed the triage vital signs and the nursing notes.  Differential diagnosis includes, but is not limited to, intentional overdose, consideration for electrolyte abnormality, acute intracranial process  Patient's presentation is most consistent with acute presentation with potential threat to life or bodily function.  43 year old female presenting after overdose, minimally responsive on presentation.  Will withdraw and wince to painful stimuli.  Case discussed with poison control.  They recommend 6 hours of observation specifically for hypertension, bradycardia, can try fluids, pressors as needed.  Recommend repeat EKG in 4 to 6 hours.  Patient does have decreased mental status currently, but does appear to be protecting her airway.  Signed out to oncoming physician at 2300 pending continued observation.  When patient is more awake, will need psychiatry consultation.  IVC completed.      FINAL CLINICAL IMPRESSION(S) / ED DIAGNOSES   Final diagnoses:  Intentional drug overdose, initial encounter [facility])     Rx / DC Orders   ED Discharge Orders     None        Note:  This document was prepared using Dragon voice recognition software and may include unintentional dictation errors.   Claria Crofts, MD 03/08/24 [phone]

## 2024-03-08 NOTE — ED Notes (Signed)
 Phone call placed to poison control. Recommend monitoring for 6 hours from arrival as ingestion time is unknown. Can see potential hypotension, bradycardia from tizanidine. Recommend if that happens to first try physical stimuli as that can improve, if not then fluids, then pressors if no response. With possibility of benadryl could see possible seizure activity and possibly hallucinations. Repeat EKG in 4-6 hours.

## 2024-03-08 NOTE — ED Notes (Addendum)
 Patient straight cath for urine sample. Patient rolled to get extra sheets out from patient. Patient moaning as she was rolled.

## 2024-03-08 NOTE — ED Triage Notes (Signed)
 To ER from home via EMS after being found by family on the floor next to her bed, breathing but not responsive. Patient responds to pain.  Received 2mg  of narcan. Found with empty xanax  bottle next to her.

## 2024-03-09 ENCOUNTER — Other Ambulatory Visit: Payer: Self-pay

## 2024-03-09 DIAGNOSIS — F419 Anxiety disorder, unspecified: Secondary | ICD-10-CM

## 2024-03-09 LAB — URINE DRUG SCREEN, QUALITATIVE (ARMC ONLY)
Amphetamines, Ur Screen: NOT DETECTED
Barbiturates, Ur Screen: NOT DETECTED
Benzodiazepine, Ur Scrn: POSITIVE — AB
Cannabinoid 50 Ng, Ur ~~LOC~~: POSITIVE — AB
Cocaine Metabolite,Ur ~~LOC~~: NOT DETECTED
MDMA (Ecstasy)Ur Screen: NOT DETECTED
Methadone Scn, Ur: NOT DETECTED
Opiate, Ur Screen: NOT DETECTED
Phencyclidine (PCP) Ur S: NOT DETECTED
Tricyclic, Ur Screen: NOT DETECTED

## 2024-03-09 MED ORDER — ACETAMINOPHEN 325 MG PO TABS
650.0000 mg | ORAL_TABLET | Freq: Once | ORAL | Status: AC
Start: 2024-03-09 — End: 2024-03-09
  Administered 2024-03-09: 650 mg via ORAL
  Filled 2024-03-09: qty 2

## 2024-03-09 NOTE — ED Notes (Signed)
 Patient alert and oriented upon discharge. Mood and affect normal. No c/o pain. Resources explained and given to the patient.

## 2024-03-09 NOTE — ED Notes (Signed)
 Patient awake and alert. When asked about what happened prior to arrival to hospital, patient reports "I have been so tired and just needed to get some sleep. I take 2 benadryl every night for sleep. I haven't been sleeping, my mom died recently 11-Feb-2024) and I just need to sleep and if the George Kinder took me than so be it". When asking patient how much of her medications she took she reports "a handful of benadryl, I'm not sure how many I didn't count, 10 of my Tizanidine, a couple of zofran . I am out of my Xanax , there is more at the pharmacy". Patient denies this being a suicide attempt, unsure of what time pills were ingested, thought it was in the afternoon.

## 2024-03-09 NOTE — ED Notes (Signed)
 Answered call light, pt needed to use the bathroom, assisted pt to the bathroom and back to the bed. Ensured posey alarm was on and working.

## 2024-03-09 NOTE — ED Provider Notes (Signed)
 Emergency Medicine Observation Re-evaluation Note  Katherine Murray is a 43 y.o. female, seen on rounds today.  Pt initially presented to the ED for complaints of Drug Overdose  Currently, the patient is is no acute distress. Denies any concerns at this time.  Physical Exam  Blood pressure 106/82, pulse (!) 59, temperature 98.2 F (36.8 C), temperature source Oral, resp. rate 13, height 5\' 5"  (1.651 m), weight 87.6 kg, SpO2 95%.  Physical Exam: General: No apparent distress Pulm: Normal WOB Neuro: Moving all extremities Psych: Resting comfortably     ED Course / MDM     I have reviewed the labs performed to date as well as medications administered while in observation.  Recent changes in the last 24 hours include: No acute events overnight.  Plan   Current plan: Patient awaiting psychiatric disposition. Patient is under full IVC at this time.    Maizie Garno, Clover Dao, DO 03/09/24 [phone]

## 2024-03-09 NOTE — ED Provider Notes (Addendum)
 2:00 AM  Pt now awake and talking.  HD stable.  QRS and QT interval normal.  Will consult psych.   Sharne Linders, Clover Dao, DO 03/09/24 0206   4:10 AM Pt's repeat EKG stable.  Patient medically cleared at this time for psychiatric disposition.   Jonelle Bann, Clover Dao, DO 03/09/24 [phone]

## 2024-03-09 NOTE — ED Provider Notes (Signed)
-----------------------------------------   12:27 PM on 03/09/2024 -----------------------------------------  I took over care of this patient from Dr. Author Board.  She has been evaluated by Dr. Alicia Inoue from telepsychiatry who does not recommend inpatient admission and advised that the patient is stable for discharge.  I went to reassess the patient.  She is alert and oriented.  She is comfortable appearing and pleasant.  She adamantly denies any SI or HI.  She states that she was not intending to harm herself when she took the medications last night, and currently has no intention to harm herself or anyone else.  She feels safe going home.  At this time, there is no evidence of acute danger to self or others.  I have rescinded the IVC.  Return precautions provided.  The patient is stable for discharge at this time.   Lind Repine, MD 03/09/24 1229

## 2024-03-09 NOTE — Discharge Instructions (Addendum)
 Follow-up as recommended by psychiatry.  Return to the ER immediately for any new, worsening, or recurrent symptoms that concern you, or any thoughts of wanting to harm yourself or anyone else.

## 2024-03-09 NOTE — ED Notes (Signed)
 Rescinded by Dr. Demetrios Finders

## 2024-03-09 NOTE — ED Notes (Signed)
 IVC/No Consult ordered/Legal Paperwork Completed

## 2024-03-09 NOTE — Consult Note (Addendum)
 [facility] Consult Note  Patient Name: Katherine Murray MRN: [id number] DOB: 17-Jun-1981 DATE OF Consult: 03/09/2024  PRIMARY PSYCHIATRIC DIAGNOSES  1.  Anxiety disorder, unspecified    RECOMMENDATIONS  Recommendations: Medication recommendations: Continue home meds per primary care doctor  Non-Medication/therapeutic recommendations: Follow-up with primary care doctor; resources for outpatient therapy and psychiatry crisis line information; ED return precautions Is inpatient psychiatric hospitalization recommended for this patient? No (Explain why): Denies suicidal and homicidal ideation  Follow-Up Telepsychiatry C/L services: We will sign off for now. Please re-consult our service if needed for any concerning changes in the patient's condition, discharge planning, or questions. Communication: Treatment team members (and family members if applicable) who were involved in treatment/care discussions and planning, and with whom we spoke or engaged with via secure text/chat, include the following: Heather; Ace Abu; Odilia Bennett; Gene Kemps "Katherine Andrews" Murray is a 43 year old female with a history of anxiety, atrial fibrillation, asthma who presents to the ED after reportedly being found by family on the floor next to her bed, breathing but unresponsive, given Narcan by EMS, found with pill bottles next to her. In ED, patient reportedly states she felt tired and hadn't been sleeping so she took "a handful of Bendaryl, 10 Tizanidine, and a couple of Zofran ." Denies that she took Xanax , states she had run out and denies it was a suicide attempt. USD positive for benzos and cannabis. Psychiatry consulted for disposition recommendations. On evaluation, patient noted to be calm, cooperative, euthymic, linear, not appearing internally preoccupied, not responding to internal stimuli, alert and oriented x 4. Patient reports over the past week she has felt overwhelmed and has only been sleeping 2 hours per night.  Patient reports her mom died in 01/15/2024, her dad died last year and she missed them over Easter. States she took a handful of Benadryl and 6 tizanidine and "I just lay down. I didn't want to die. I just wanted to sleep good."  Repeatedly denies taking pills was a suicide attempt, reports she has a lot to live for. Patient states her daughter is protective against suicide. Denies current suicidal ideation, intent, plan. Denies access to firearms.  Denies depressed mood, other depressive symptoms. Denies currently feeling stressed or overwhelmed. Denies symptoms consistent with mania/hypomania, paranoia, auditory and visual hallucinations, homicidal ideation. Patient reports she misses her daughter and would like to go home and feels safe to do so. Patient's presentation consistent with anxiety disorder, unspecified and accidental overdose. Patient denies suicidal ideation, intent, plan. Patient is currently low risk for suicide due to significant protective factors including responsibility to her daughter, family, future oriented, identifies reasons to live, regretful about events leading to ED presentation. Therefore, inpatient psychiatric hospitalization is not recommended.   Thank you for involving us  in the care of this patient. If you have any additional questions or concerns, please call [phone] and ask for me or the provider on-call.  TELEPSYCHIATRY ATTESTATION & CONSENT  As the provider for this telehealth consult, I attest that I verified the patient's identity using two separate identifiers, introduced myself to the patient, provided my credentials, disclosed my location, and performed this encounter via a HIPAA-compliant, real-time, face-to-face, two-way, interactive audio and video platform and with the full consent and agreement of the patient (or guardian as applicable.)  Patient physical location: ED in [facility]  Telehealth provider physical location: home office in state of  [place] .  Video start time: 1114 AM EST Video end time: 1129 AM EST  IDENTIFYING  DATA  KEMARIA DEDIC is a 43 y.o. year-old female for whom a psychiatric consultation has been ordered by the primary provider. The patient was identified using two separate identifiers.  CHIEF COMPLAINT/REASON FOR CONSULT  Medication overdose    HISTORY OF PRESENT ILLNESS (HPI)  Katherine Murray is a 43 year old female with a history of anxiety, atrial fibrillation, asthma who presents to the ED after reportedly being found by family on the floor next to her bed, breathing but unresponsive, given Narcan by EMS, found with pill bottles next to her. In ED, patient reportedly states she felt tired and hadn't been sleeping so she took "a handful of Bendaryl, 10 Tizanidine, and a couple of Zofran ." Denies that she took Xanax , states she had run out and denies it was a suicide attempt. USD positive for benzos and cannabis. Psychiatry consulted for disposition recommendations.   On evaluation, patient noted to be calm, cooperative, euthymic, linear, not appearing internally preoccupied, not responding to internal stimuli, alert and oriented x 4. Patient reports over the past week she has felt overwhelmed and has only been sleeping 2 hours per night. Patient reports her mom died in 01-10-24, her dad died last year and she missed them over Easter. She states, "I didn't want to die. I don't want to die." States she took a handful of Benadryl and 6 tizanidine and "I just lay down. I didn't want to die. I just wanted to sleep good." Patient states, "When it's my time to go the lord will take me. I don't want to die though and I didn't want to die yesterday." Repeatedly denies taking pills was a suicide attempt, reports she has a lot to live for. Patient states her daughter is protective against suicide. She states, "I'm the only one who she has." Patient states, "I would never want to harm myself." Patient denies that she  took additional Xanax , states she ran out and has a refill to pick up at the pharmacy. Denies current suicidal ideation, intent, plan. Denies access to firearms. Patient states, "I really wanted to get some rest. I know now that it could have hurt me, but I didn't do it for that." States she feels regretful about what she did even though it was an accident. Denies depressed mood, other depressive symptoms. Denies currently feeling stressed or overwhelmed. Denies symptoms consistent with mania/hypomania, paranoia, auditory and visual hallucinations, homicidal ideation.Patient reports this week was harder but states she has been happy and calm otherwise. And reports she feels better now. Reports she has support from family. 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 Reports she is "ready to see my daughter." Patient reports she has an appointment with her primary care doctor on Wednesday.    PAST PSYCHIATRIC HISTORY  Current psych meds: Xanax   Prior psych meds: Prozac , Xanax   Outpatient: Primary care provider  Inpatient: Denies Non-suicidal self injury:  Denies Suicide attempts: Denies Violence: Denies Otherwise as per HPI above.  PAST MEDICAL HISTORY  Past Medical History:  Diagnosis Date   A-fib [facility])    Asthma    Cancer (HCC)    skin   Diverticulosis    Gallstones    Headache    Heart rate fast    Kidney stones    Lower abdominal pain    Right   Mitral regurgitation    Mild   Ovarian cyst 09/16/2016   right measuring 4.4 cm   Pneumonia  Tachycardia 08/01/2016   Tricuspid regurgitation    Trace   Vaginal infection      HOME MEDICATIONS  Facility Ordered Medications  Medication   [COMPLETED] acetaminophen  (TYLENOL ) tablet 650 mg   PTA Medications  Medication Sig   levonorgestrel  (MIRENA ) 20 MCG/DAY IUD 1 each by Intrauterine route once.   albuterol  (VENTOLIN  HFA) 108 (90 Base) MCG/ACT inhaler INHALE 2 PUFFS INTO THE LUNGS EVERY 4  HOURS AS NEEDED FOR WHEEZING OR SHORTNESS OF BREATH.   SUMAtriptan  (IMITREX ) 50 MG tablet TAKE ONE TABLET BY MOUTH X 1 , MAY REPEAT DOSE X 1 AFTER 2 HOURS IF STILL WITH SYMPTOMS   ALPRAZolam  (XANAX ) 0.25 MG tablet Take 0.25 mg by mouth 3 (three) times daily as needed for anxiety. Max dose is 3 tablets (0.75 mg) in 24 hours.   cyanocobalamin  (VITAMIN B12) 1000 MCG/ML injection Inject 1,000 mcg into the muscle every 30 (thirty) days.   Dihydroergotamine Mesylate HFA (TRUDHESA) 0.725 MG/ACT AERS Place 0.725 mg into the nose every other day as needed.   FLUoxetine  (PROZAC ) 20 MG capsule Take 40 mg by mouth daily.   HYDROcodone -acetaminophen  (NORCO/VICODIN) 5-325 MG tablet Take 1 tablet by mouth every 6 (six) hours as needed.   WEGOVY  1.7 MG/0.75ML SOAJ Inject 1.7 mg into the skin once a week.   phentermine 37.5 MG capsule Take 37.5 mg by mouth every morning.   ondansetron  (ZOFRAN ) 4 MG tablet Take 4 mg by mouth daily as needed.   Ibuprofen  (ADVIL  LIQUI-GELS MINIS) 200 MG CAPS Take 400 mg by mouth 2 (two) times daily as needed.   acetaminophen  (TYLENOL ) 325 MG tablet Take 650 mg by mouth every 6 (six) hours as needed.     ALLERGIES  Allergies  Allergen Reactions   Lyrica [Pregabalin] Swelling   Egg-Derived Products Rash and Swelling   Latex Rash    SOCIAL & SUBSTANCE USE HISTORY  Social History   Socioeconomic History   Marital status: Single    Spouse name: Not on file   Number of children: 1   Years of education: Not on file   Highest education level: Not on file  Occupational History   Occupation: [employer]  Tobacco Use   Smoking status: Some Days    Current packs/day: 0.10    Average packs/day: 0.1 packs/day for 7.0 years (0.7 ttl pk-yrs)    Types: Cigarettes   Smokeless tobacco: Never   Tobacco comments:    smokes "once or twice a week"  Vaping Use   Vaping status: Never Used  Substance and Sexual Activity   Alcohol use: Not Currently    Comment: rare   Drug  use: No   Sexual activity: Yes    Partners: Male    Birth control/protection: I.U.D.    Comment: same partner five years  Other Topics Concern   Not on file  Social History Narrative   Not on file   Social Drivers of Health   Financial Resource Strain: Low Risk  (05/21/2023)   Received from [facility]   Overall Financial Resource Strain (CARDIA)    Difficulty of Paying Living Expenses: Not hard at all  Food Insecurity: No Food Insecurity (05/21/2023)   Received from [facility]   Hunger Vital Sign    Worried About Running Out of Food in the Last Year: Never true    Ran Out of Food in the Last Year: Never true  Transportation Needs: No Transportation Needs (05/21/2023)   Received from [facility]  PRAPARE - Administrator, Civil Service (Medical): No    Lack of Transportation (Non-Medical): No  Physical Activity: Not on file  Stress: Not on file  Social Connections: Unknown (05/16/2023)   Received from [facility]   Social Network    Social Network: Not on file   Social History   Tobacco Use  Smoking Status Some Days   Current packs/day: 0.10   Average packs/day: 0.1 packs/day for 7.0 years (0.7 ttl pk-yrs)   Types: Cigarettes  Smokeless Tobacco Never  Tobacco Comments   smokes "once or twice a week"   Social History   Substance and Sexual Activity  Alcohol Use Not Currently   Comment: rare   Social History   Substance and Sexual Activity  Drug Use No    Additional pertinent information Cannabis.  FAMILY HISTORY  Family History  Problem Relation Age of Onset   Diabetes Mother    Hypertension Mother    Hyperlipidemia Mother    Diabetes Father    Hypertension Father    Hyperlipidemia Father    Ovarian cancer Maternal Grandmother    Ulcerative colitis Maternal Grandmother    Stomach cancer Maternal Grandfather    Colon cancer Paternal Grandmother    Diabetes Paternal Grandmother    Kidney disease Paternal Grandfather    Breast cancer  Neg Hx    Family Psychiatric History (if known):  Denies    MENTAL STATUS EXAM (MSE)  Mental Status Exam: General Appearance: Casual  Orientation:  Full (Time, Place, and Person)  Memory:  Immediate;   Good Recent;   Fair Remote;   Good  Concentration:  Concentration: Good and Attention Span: Good  Recall:  Good  Attention  Good  Eye Contact:  Good  Speech:  Clear and Coherent  Language:  Good  Volume:  Normal  Mood: "I feel good now"  Affect:  Appropriate  Thought Process:  Coherent  Thought Content:  Abstract Reasoning  Suicidal Thoughts:  No  Homicidal Thoughts:  No  Judgement:  Fair  Insight:  Good  Psychomotor Activity:  Normal  Akathisia:  NA  Fund of Knowledge:  Good    Assets:  Therapist, sports Vocational/Educational  Cognition:  WNL  ADL's:  Intact  AIMS (if indicated):       VITALS  Blood pressure 102/81, pulse 71, temperature 97.8 F (36.6 C), temperature source Oral, resp. rate 13, height 5\' 5"  (1.651 m), weight 87.6 kg, SpO2 96%.  LABS  Admission on 03/08/2024  Component Date Value Ref Range Status   Glucose-Capillary 03/08/2024 184 (H)  70 - 99 mg/dL Final   Glucose reference range applies only to samples taken after fasting for at least 8 hours.   Sodium 03/08/2024 135  135 - 145 mmol/L Final   Potassium 03/08/2024 3.5  3.5 - 5.1 mmol/L Final   Chloride 03/08/2024 101  98 - 111 mmol/L Final   CO2 03/08/2024 25  22 - 32 mmol/L Final   Glucose, Bld 03/08/2024 177 (H)  70 - 99 mg/dL Final   Glucose reference range applies only to samples taken after fasting for at least 8 hours.   BUN 03/08/2024 14  6 - 20 mg/dL Final   Creatinine, Ser 03/08/2024 0.91  0.44 - 1.00 mg/dL Final   Calcium 81/19/1478 9.2  8.9 - 10.3 mg/dL Final   Total Protein 29/56/2130 6.5  6.5 - 8.1 g/dL Final   Albumin 86/57/8469 3.9  3.5 - 5.0 g/dL Final  AST 03/08/2024 34  15 - 41 U/L Final   ALT 03/08/2024 29  0 - 44 U/L Final    Alkaline Phosphatase 03/08/2024 32 (L)  38 - 126 U/L Final   Total Bilirubin 03/08/2024 0.6  0.0 - 1.2 mg/dL Final   GFR, Estimated 03/08/2024 >60  >60 mL/min Final   Comment: (NOTE) Calculated using the CKD-EPI Creatinine Equation (2021)    Anion gap 03/08/2024 9  5 - 15 Final   Performed at [facility], [address] [place], [place] [address]   Salicylate Lvl 03/08/2024 <7.0 (L)  7.0 - 30.0 mg/dL Final   Performed at [facility], [address] [place], [place] [address]   Acetaminophen  (Tylenol ), Serum 03/08/2024 <10 (L)  10 - 30 ug/mL Final   Comment: (NOTE) Therapeutic concentrations vary significantly. A range of 10-30 ug/mL  may be an effective concentration for many patients. However, some  are best treated at concentrations outside of this range. Acetaminophen  concentrations >150 ug/mL at 4 hours after ingestion  and >50 ug/mL at 12 hours after ingestion are often associated with  toxic reactions.  Performed at [facility], [address] [place], [place] [address]    Alcohol, Ethyl (B) 03/08/2024 <10  <10 mg/dL Final   Comment: (NOTE) For medical purposes only. Performed at [facility], [address] [place], [place] [address]    Tricyclic, Ur Screen 03/08/2024 NONE DETECTED  NONE DETECTED Final   Amphetamines, Ur Screen 03/08/2024 NONE DETECTED  NONE DETECTED Final   MDMA (Ecstasy)Ur Screen 03/08/2024 NONE DETECTED  NONE DETECTED Final   Cocaine Metabolite,Ur [place] 03/08/2024 NONE DETECTED  NONE DETECTED Final   Opiate, Ur Screen 03/08/2024 NONE DETECTED  NONE DETECTED Final   Phencyclidine (PCP) Ur S 03/08/2024 NONE DETECTED  NONE DETECTED Final   Cannabinoid 50 Ng, Ur [place] 03/08/2024 POSITIVE (A)  NONE DETECTED Final   Barbiturates, Ur Screen 03/08/2024 NONE DETECTED  NONE DETECTED Final   Benzodiazepine, Ur Scrn 03/08/2024 POSITIVE (A)  NONE DETECTED Final   Methadone Scn, Ur 03/08/2024 NONE DETECTED  NONE DETECTED Final    Comment: (NOTE) Tricyclics + metabolites, urine    Cutoff 1000 ng/mL Amphetamines + metabolites, urine  Cutoff 1000 ng/mL MDMA (Ecstasy), urine              Cutoff 500 ng/mL Cocaine Metabolite, urine          Cutoff 300 ng/mL Opiate + metabolites, urine        Cutoff 300 ng/mL Phencyclidine (PCP), urine         Cutoff 25 ng/mL Cannabinoid, urine                 Cutoff 50 ng/mL Barbiturates + metabolites, urine  Cutoff 200 ng/mL Benzodiazepine, urine              Cutoff 200 ng/mL Methadone, urine                   Cutoff 300 ng/mL  The urine drug screen provides only a preliminary, unconfirmed analytical test result and should not be used for non-medical purposes. Clinical consideration and professional judgment should be applied to any positive drug screen result due to possible interfering substances. A more specific alternate chemical method must be used in order to obtain a confirmed analytical result. Gas chromatography / mass spectrometry (GC/MS) is the preferred confirm  atory method. Performed at [facility], [address] [place], [place] [address]    WBC 03/08/2024 12.1 (H)  4.0 - 10.5 K/uL Final   RBC 03/08/2024 3.67 (L)  3.87 - 5.11 MIL/uL Final   Hemoglobin 03/08/2024 12.5  12.0 - 15.0 g/dL Final   HCT 60/45/4098 36.0  36.0 - 46.0 % Final   MCV 03/08/2024 98.1  80.0 - 100.0 fL Final   MCH 03/08/2024 34.1 (H)  26.0 - 34.0 pg Final   MCHC 03/08/2024 34.7  30.0 - 36.0 g/dL Final   RDW 11/91/4782 12.7  11.5 - 15.5 % Final   Platelets 03/08/2024 337  150 - 400 K/uL Final   nRBC 03/08/2024 0.0  0.0 - 0.2 % Final   Neutrophils Relative % 03/08/2024 51  % Final   Neutro Abs 03/08/2024 6.1  1.7 - 7.7 K/uL Final   Lymphocytes Relative 03/08/2024 44  % Final   Lymphs Abs 03/08/2024 5.4 (H)  0.7 - 4.0 K/uL Final   Monocytes Relative 03/08/2024 4  % Final   Monocytes Absolute 03/08/2024 0.5  0.1 - 1.0 K/uL Final   Eosinophils Relative  03/08/2024 1  % Final   Eosinophils Absolute 03/08/2024 0.1  0.0 - 0.5 K/uL Final   Basophils Relative 03/08/2024 0  % Final   Basophils Absolute 03/08/2024 0.1  0.0 - 0.1 K/uL Final   Immature Granulocytes 03/08/2024 0  % Final   Abs Immature Granulocytes 03/08/2024 0.05  0.00 - 0.07 K/uL Final   Performed at [facility], [address] [place], [place] [address]   Preg Test, Ur 03/08/2024 Negative  Negative Final   Magnesium 03/08/2024 2.2  1.7 - 2.4 mg/dL Final   Performed at [facility], [address] [place], [place] [address]    PSYCHIATRIC REVIEW OF SYSTEMS (ROS)  ROS: Notable for the following relevant positive findings: Review of Systems  Psychiatric/Behavioral:  The patient has insomnia.     Additional findings:      Musculoskeletal: No abnormal movements observed      Gait & Station: Laying/Sitting      Pain Screening: Denies      Nutrition & Dental Concerns: n/a  RISK FORMULATION/ASSESSMENT  Is the patient experiencing any suicidal or homicidal ideations: No        Protective factors considered for safety management: responsibility to her daughter, family, future oriented, identifies reasons to live, regretful about events leading to ED presentation  Risk factors/concerns considered for safety management:  Impulsivity  Is there a safety management plan with the patient and treatment team to minimize risk factors and promote protective factors: Yes           Explain: -Follow-up with primary doctor Wednesday; resources for outpatient therapy and psychiatry; crisis line information; ED return precautions  Is crisis care placement or psychiatric hospitalization recommended: No     Based on my current evaluation and risk assessment, patient is determined at this time to be at:  Low risk  *RISK ASSESSMENT Risk assessment is a dynamic process; it is possible that this patient's condition, and risk level, may change. This should be re-evaluated and  managed over time as appropriate. Please re-consult psychiatric consult services if additional assistance is needed in terms of risk assessment and management. If your team decides to discharge this patient, please advise the patient how to best access emergency psychiatric services, or to call 911, if their condition worsens or they feel unsafe in any way.  Atha Lazar, MD Telepsychiatry Consult Services

## 2024-03-09 NOTE — ED Notes (Signed)
 Patient talking with Psychiatry via Tele consult

## 2024-04-16 ENCOUNTER — Telehealth: Payer: Self-pay

## 2024-04-16 NOTE — Transitions of Care (Post Inpatient/ED Visit) (Signed)
 Unable to reach patient by phone and left v/m requesting call back at [phone].       04/16/2024  Name: Katherine Murray MRN: [id number] DOB: 1981/04/28  Today's TOC FU Call Status: Today's TOC FU Call Status:: Unsuccessful Call (1st Attempt) Unsuccessful Call (1st Attempt) Date: 04/16/24  Attempted to reach the patient regarding the most recent Inpatient/ED visit.  Follow Up Plan: Additional outreach attempts will be made to reach the patient to complete the Transitions of Care (Post Inpatient/ED visit) call.   Signature Claretha Crocker, LPN

## 2024-04-22 NOTE — Transitions of Care (Post Inpatient/ED Visit) (Signed)
 Unable to reach pt by phone and this is third attempt to reach pt. Pt last seen 01/05/2022.Sendin g note to T Dugal FNP.       04/22/2024  Name: Katherine Murray MRN: [id number] DOB: 10/06/1981  Today's TOC FU Call Status: Today's TOC FU Call Status:: Unsuccessful Call (3rd Attempt) Unsuccessful Call (1st Attempt) Date: 04/16/24 Unsuccessful Call (3rd Attempt) Date: 04/22/24  Attempted to reach the patient regarding the most recent Inpatient/ED visit.  Follow Up Plan: No further outreach attempts will be made at this time. We have been unable to contact the patient.  Signature Claretha Crocker, LPN

## 2024-04-23 NOTE — Telephone Encounter (Signed)
 NOTED

## 2024-10-27 ENCOUNTER — Emergency Department

## 2024-10-27 ENCOUNTER — Ambulatory Visit

## 2024-10-27 ENCOUNTER — Emergency Department: Admission: EM | Admit: 2024-10-27 | Discharge: 2024-10-27 | Disposition: A | Discharge status: Home / Self Care

## 2024-10-27 ENCOUNTER — Other Ambulatory Visit: Payer: Self-pay

## 2024-10-27 ENCOUNTER — Other Ambulatory Visit: Payer: Self-pay | Admitting: Physician Assistant

## 2024-10-27 DIAGNOSIS — S93402A Sprain of unspecified ligament of left ankle, initial encounter: Secondary | ICD-10-CM

## 2024-10-27 DIAGNOSIS — M7989 Other specified soft tissue disorders: Secondary | ICD-10-CM

## 2024-10-27 DIAGNOSIS — M79661 Pain in right lower leg: Secondary | ICD-10-CM

## 2024-10-27 DIAGNOSIS — L0390 Cellulitis, unspecified: Secondary | ICD-10-CM

## 2024-10-27 LAB — CBC WITH DIFFERENTIAL/PLATELET
Abs Immature Granulocytes: 0.03 K/uL (ref 0.00–0.07)
Basophils Absolute: 0.1 K/uL (ref 0.0–0.1)
Basophils Relative: 1 %
Eosinophils Absolute: 0.3 K/uL (ref 0.0–0.5)
Eosinophils Relative: 3 %
HCT: 38.5 % (ref 36.0–46.0)
Hemoglobin: 12.9 g/dL (ref 12.0–15.0)
Immature Granulocytes: 0 %
Lymphocytes Relative: 29 %
Lymphs Abs: 2.3 K/uL (ref 0.7–4.0)
MCH: 32.8 pg (ref 26.0–34.0)
MCHC: 33.5 g/dL (ref 30.0–36.0)
MCV: 98 fL (ref 80.0–100.0)
Monocytes Absolute: 0.4 K/uL (ref 0.1–1.0)
Monocytes Relative: 5 %
Neutro Abs: 5 K/uL (ref 1.7–7.7)
Neutrophils Relative %: 62 %
Platelets: 466 K/uL — ABNORMAL HIGH (ref 150–400)
RBC: 3.93 MIL/uL (ref 3.87–5.11)
RDW: 11.9 % (ref 11.5–15.5)
WBC: 8 K/uL (ref 4.0–10.5)
nRBC: 0 % (ref 0.0–0.2)

## 2024-10-27 LAB — PROTIME-INR
INR: 1 (ref 0.8–1.2)
Prothrombin Time: 13.5 s (ref 11.4–15.2)

## 2024-10-27 LAB — BASIC METABOLIC PANEL WITH GFR
Anion gap: 12 (ref 5–15)
BUN: 9 mg/dL (ref 6–20)
CO2: 26 mmol/L (ref 22–32)
Calcium: 9.4 mg/dL (ref 8.9–10.3)
Chloride: 101 mmol/L (ref 98–111)
Creatinine, Ser: 0.6 mg/dL (ref 0.44–1.00)
GFR, Estimated: >60 mL/min (ref >60)
Glucose, Bld: 79 mg/dL (ref 70–99)
Potassium: 4 mmol/L (ref 3.5–5.1)
Sodium: 139 mmol/L (ref 135–145)

## 2024-10-27 LAB — HCG, QUANTITATIVE, PREGNANCY: hCG, Beta Chain, Quant, S: <1 m[IU]/mL (ref <5)

## 2024-10-27 MED ORDER — DOXYCYCLINE HYCLATE 100 MG PO TABS: 100.0000 mg | ORAL_TABLET | Freq: Two times a day (BID) | ORAL | 0 refills | Status: AC

## 2024-10-27 MED ORDER — DOXYCYCLINE HYCLATE 100 MG PO TABS
100.0000 mg | ORAL_TABLET | Freq: Once | ORAL | Status: AC
  Administered 2024-10-27: 100 mg via ORAL
  Filled 2024-10-27: qty 1

## 2024-10-27 MED ORDER — OXYCODONE-ACETAMINOPHEN 5-325 MG PO TABS
1.0000 | ORAL_TABLET | Freq: Once | ORAL | Status: AC
  Administered 2024-10-27: 1 via ORAL
  Filled 2024-10-27: qty 1

## 2024-10-27 MED ORDER — KETOROLAC TROMETHAMINE 15 MG/ML IJ SOLN
15.0000 mg | Freq: Once | INTRAMUSCULAR | Status: AC
  Administered 2024-10-27: 15 mg via INTRAMUSCULAR
  Filled 2024-10-27: qty 1

## 2024-10-27 MED ORDER — OXYCODONE-ACETAMINOPHEN 5-325 MG PO TABS: 1.0000 | ORAL_TABLET | Freq: Four times a day (QID) | ORAL | 0 refills | Status: AC | PRN

## 2024-10-27 NOTE — ED Triage Notes (Signed)
 Pt presents to the ED via POV from home with RLE swelling and pain. Pt has a small fx and sprain in that ankle. MD sent here to r/o DVT. Denies SHOB or CP. PT A&Ox4.

## 2024-10-27 NOTE — Discharge Instructions (Addendum)

## 2024-10-27 NOTE — ED Notes (Signed)
 See triage note  Presents with pain and swelling to right ankle and lower leg  Sent in from [facility]

## 2024-10-27 NOTE — ED Provider Notes (Signed)
 [facility] Provider Note    Event Date/Time   First MD Initiated Contact with Patient 10/27/24 1335     (approximate)   History   Ankle Pain   HPI  Katherine Murray is a 43 y.o. female presenting with ankle pain.  The patient had a mechanical fall from standing a few days ago when she was nauseous and vomiting, she twisted her ankle at the time.  She went to orthopedic clinic and was told that she has a ankle fracture, she was discharged home with a boot.  Was recently admitted to [facility] for diverticulitis and poor p.o. intake.  Was eventually able to be discharged home however since the time of discharge and now has developed some pain along her right ankle with radiation of her right calf.  I denies any chest pain shortness of breath.  No prior history of PEs or DVTs although her father did just die from a PE about a year ago and apparently has a family history of frequent DVTs but no clear cause for them.  She denies any associated fevers chills or other symptoms.  Apparently went back to orthopedic surgery clinic today given swelling and pain along her ankle and was told that this was actually a ankle sprain and not a ankle fracture but given the degree of swelling was instructed to come into the emergency department due to concern of a possible DVT.     Physical Exam   Triage Vital Signs: ED Triage Vitals  Encounter Vitals Group     BP 10/27/24 1314 (!) 127/94     Girls Systolic BP Percentile --      Girls Diastolic BP Percentile --      Boys Systolic BP Percentile --      Boys Diastolic BP Percentile --      Pulse Rate 10/27/24 1314 (!) 124     Resp 10/27/24 1314 18     Temp 10/27/24 1314 98.2 F (36.8 C)     Temp Source 10/27/24 1314 Oral     SpO2 10/27/24 1314 100 %     Weight 10/27/24 1315 165 lb (74.8 kg)     Height 10/27/24 1315 5' 5 (1.651 m)     Head Circumference --      Peak Flow --      Pain Score 10/27/24 1315 10     Pain Loc --       Pain Education --      Exclude from Growth Chart --     Most recent vital signs: Vitals:   10/27/24 1314  BP: (!) 127/94  Pulse: (!) 124  Resp: 18  Temp: 98.2 F (36.8 C)  SpO2: 100%     General: Awake, no distress.  CV:  Good peripheral perfusion.  Resp:  Normal effort.  Abd:  No distention.  Soft nontender MSK:  Swelling along the circumferential right ankle with some slight erythema along the lateral malleoli.  Tenderness to palpation, but appropriate pulses are present, there is tenderness along the distal calf, but I do not appreciate significant swelling along this area Other:     ED Results / Procedures / Treatments   Labs (all labs ordered are listed, but only abnormal results are displayed) Labs Reviewed  CBC WITH DIFFERENTIAL/PLATELET - Abnormal; Notable for the following components:      Result Value   Platelets 466 (*)    All other components within normal limits  BASIC METABOLIC PANEL WITH  GFR  PROTIME-INR  HCG, QUANTITATIVE, PREGNANCY     EKG     RADIOLOGY Soft tissue swelling noted but no acute fracture appreciated on this x-ray on my independent interpretation  PROCEDURES:  Critical Care performed: No  Procedures   MEDICATIONS ORDERED IN ED: Medications  oxyCODONE -acetaminophen  (PERCOCET/ROXICET) 5-325 MG per tablet 1 tablet (1 tablet Oral Given 10/27/24 1428)     IMPRESSION / MDM / ASSESSMENT AND PLAN / ED COURSE  I reviewed the triage vital signs and the nursing notes.                               Patient's presentation is most consistent with acute presentation with potential threat to life or bodily function.  43 year old female who presents today with concern of right leg swelling in the setting of recent ankle injury.  At rest she appears well and in no acute distress.  She was just recently admitted for diverticulitis.  She is tachycardic here but remaining vitals appear appropriate.  Will obtain ultrasound imaging I will also  repeat the x-rays of not able to review the original images and there is questionable history in regards to whether there is an actual fracture or sprain.  Will follow-up lab results will attempt symptomatic management we will determine safe disposition appropriately.       FINAL CLINICAL IMPRESSION(S) / ED DIAGNOSES   Final diagnoses:  Sprain of left ankle, unspecified ligament, initial encounter  Leg swelling     Rx / DC Orders   ED Discharge Orders     None        Note:  This document was prepared using Dragon voice recognition software and may include unintentional dictation errors.   Fernand Rossie HERO, MD 10/27/24 [phone]

## 2024-10-27 NOTE — Care Plan (Signed)
 Transition of Care Encounter Data   Call attempt: 1 Admission date: 10/21/24 Discharge date: 10/24/24 Discharge diagnosis: Diverticulitis of descending colon Do you have a hospital follow up appointment?: No - None Scheduled Patient post discharge: Medications:      SABRA   [facility]: [phone]:  .  [facility]: [phone]:  .  Other: Contact PCP:        [facility] TRANSITIONAL CASE MANAGEMENT SUMMARY NOTE   Attempted to contact patient today at Home to complete Transitional Case Management call from [facility]. Left message for patient to return call; direct phone number included in message left for patient ; 1st attempt.  Koren Shines, BSN, RN
